# Patient Record
Sex: Male | Born: 1965 | Race: White | Hispanic: No | Marital: Married | Smoking: Never smoker
Health system: Southern US, Community
[De-identification: ages and names within clinical notes are randomized; demographics above are authoritative.]

## PROBLEM LIST (undated history)

## (undated) DIAGNOSIS — K5900 Constipation, unspecified: Secondary | ICD-10-CM

## (undated) DIAGNOSIS — H9190 Unspecified hearing loss, unspecified ear: Secondary | ICD-10-CM

## (undated) DIAGNOSIS — F32A Depression, unspecified: Secondary | ICD-10-CM

## (undated) DIAGNOSIS — K219 Gastro-esophageal reflux disease without esophagitis: Secondary | ICD-10-CM

## (undated) DIAGNOSIS — E7800 Pure hypercholesterolemia, unspecified: Secondary | ICD-10-CM

## (undated) DIAGNOSIS — F329 Major depressive disorder, single episode, unspecified: Secondary | ICD-10-CM

## (undated) DIAGNOSIS — D689 Coagulation defect, unspecified: Secondary | ICD-10-CM

## (undated) DIAGNOSIS — F419 Anxiety disorder, unspecified: Secondary | ICD-10-CM

## (undated) DIAGNOSIS — K760 Fatty (change of) liver, not elsewhere classified: Secondary | ICD-10-CM

## (undated) HISTORY — PX: INNER EAR SURGERY: SHX679

## (undated) HISTORY — DX: Unspecified hearing loss, unspecified ear: H91.90

## (undated) HISTORY — DX: Constipation, unspecified: K59.00

## (undated) HISTORY — DX: Gastro-esophageal reflux disease without esophagitis: K21.9

## (undated) HISTORY — DX: Coagulation defect, unspecified: D68.9

## (undated) HISTORY — DX: Fatty (change of) liver, not elsewhere classified: K76.0

## (undated) HISTORY — PX: TONSILLECTOMY: SUR1361

## (undated) HISTORY — DX: Pure hypercholesterolemia, unspecified: E78.00

---

## 1999-10-30 HISTORY — PX: CYST REMOVAL LEG: SHX6280

## 2003-07-14 ENCOUNTER — Ambulatory Visit (HOSPITAL_COMMUNITY): Admission: RE | Admit: 2003-07-14 | Discharge: 2003-07-14 | Payer: Self-pay | Admitting: Family Medicine

## 2004-08-11 ENCOUNTER — Encounter (INDEPENDENT_AMBULATORY_CARE_PROVIDER_SITE_OTHER): Payer: Self-pay | Admitting: Specialist

## 2004-08-11 ENCOUNTER — Ambulatory Visit (HOSPITAL_COMMUNITY): Admission: RE | Admit: 2004-08-11 | Discharge: 2004-08-11 | Payer: Self-pay | Admitting: Gastroenterology

## 2004-10-11 ENCOUNTER — Inpatient Hospital Stay (HOSPITAL_COMMUNITY): Admission: EM | Admit: 2004-10-11 | Discharge: 2004-10-13 | Payer: Self-pay | Admitting: Emergency Medicine

## 2009-04-01 ENCOUNTER — Encounter: Admission: RE | Admit: 2009-04-01 | Discharge: 2009-04-01 | Payer: Self-pay | Admitting: Family Medicine

## 2009-04-01 ENCOUNTER — Emergency Department (HOSPITAL_COMMUNITY): Admission: EM | Admit: 2009-04-01 | Discharge: 2009-04-01 | Payer: Self-pay | Admitting: Emergency Medicine

## 2011-03-16 NOTE — H&P (Signed)
NAME:  Luis, Russell NO.:  [id number]   MEDICAL RECORD NO.:  [id number]          PATIENT TYPE:  INP   LOCATION:  1827                         FACILITY:  [facility]   PHYSICIAN:  Sherin Quarry, MD      DATE OF BIRTH:  [birth year]   DATE OF ADMISSION:  10/11/2004  DATE OF DISCHARGE:                                HISTORY & PHYSICAL   Luis Russell is a 45 year old man who is generally in good health. 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 They were pulling a helium filled giant balloon which involved  several people manipulating the balloon with ropes. Apparently one of the  boys had some difficulty and Luis Russell went over to help him. In the  process, he was suddenly pulled by the balloon and his back was acutely  twisted. He stated that he felt a twinge in his back at the time, but did  not have any subsequent pain and completed the parade. On Sunday he noticed  that it was hard to move and that his back felt stiff. On Monday  he went to  work, but reported that it was very difficult for him to get out of a chair  and he had to attend a meeting standing up. He did rest okay on Monday  night. On Tuesday, he was unable to get out of bed. He called in sick to  work. His wife gave him two Oxycodone tablets, which she had and he applied  a TENS unit which he had received previously. This made him feel somewhat  better and he says that he slept okay last night. This morning, however,  when he got up he literally was unable to get out of bed secondary to back  pain.  He localizes the pain to the left sacroiliac area. He says that the  pain is aggravated by lying on his back or by attempting to stand. The pain  does not radiate. There is no associated numbness. There is no associated  fever, chills, breathing difficulty, chest pain, abdominal pain,  dysuria, or  hematuria. The patient presented to the [facility] emergency room with these  complaints. He was given Valium 10 mg, prednisone 60 mg, morphine 8 mg, and  Dilaudid 1 mg; but in spite of these treatments he says that the back pain  is still very severe. He does not feel that he will be able to get along at  home. He is therefore admitted for observation and treatment.   His past history is significant for in 2001 he experienced a twisting injury  to his back at work and was out of work for several days. At that time he  had an MRI scan of his back that did not reveal any acute pathology. He was  treated with anti-inflammatory medications and Oxycodone. In August 2004, he  again had low back discomfort. At that time he was seen by the physical  therapy  department and was given a TENS unit. He went for several physical  therapy treatments. He was advised to exercise regularly, but has not been  doing this.   PAST MEDICAL HISTORY:  He currently takes no medications. There are no known  allergies. He has had no operations. Medical illnesses--in addition to the  above mentioned back discomfort,  he was treated for a hip fracture at the  age of 4 by being placed in a body cast for a period of time. Also, he has  previously been diagnosed with hyperlipidemia, but does not take any  treatment for this.   FAMILY HISTORY:  His mother recently died as a result of COPD. She also has  a history of colon cancer. His sister has had two myocardial infarctions.  His brother apparently was diagnosed with a pulmonary embolus.   SOCIAL HISTORY:  He does not smoke. He will very occasionally drink alcohol.  He denies use of drugs. He is married. He has children who are in good  health.  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   REVIEW OF SYSTEMS:  HEAD: He denies headache or dizziness. EYES: He denies  visual blurring or diplopia. EARS, NOSE,  AND THROAT:  Denies earache, sinus  pain, or sore throat. There has been no neck stiffness or soreness. CHEST:  He denies cough, wheezing, or chest congestion. CARDIOVASCULAR: Denies  orthopnea, PND, or ankle edema. GI: He is currently feeling slightly  nauseated after receiving Dilaudid, but he has previously been having any  nausea, vomiting, abdominal pain, hematemesis, or melena. GU: He denies any  dysuria or urinary frequency. NEUROLOGIC: There is no history of seizure or  stroke. ENDO: He denies excessive thirst, urinary frequency, or nocturia.   PHYSICAL EXAMINATION:  VITAL SIGNS: His blood pressure is 104/58, pulse 78,  respirations 16. O2 saturation is 100%.  HEENT: Within normal limits.  CHEST: Clear.  BACK: Tenderness to palpation over the left sacroiliac region. There is no  pain to percussion or palpation over the back itself or over the sacrum.  CARDIOVASCULAR: Normal S1 and S2, without murmurs, rubs, or gallops.  ABDOMEN: Benign and normal bowel sounds. No masses or tenderness. No  guarding or rebound.  NEUROLOGIC: Cranial nerves, motor, sensory, or cerebellar testing is normal.  The patient experiences pain with straight leg raise. The muscles of the  lower extremity appear to be normal. The patient is able to dorsiflex and  plantar flex without difficulty. He is able to dorsiflex the great toe.  Ankle jerks appears to be intact. DP and PT pulses are 2+. There is no  cyanosis or edema.   IMPRESSION:  1.  Acute and chronic low back pain. From the patient's description, I think      this will probably turn out to be a musculoskeletal problem, but it is      currently quite incapacitating.  2.  History of childhood hip fracture.  3.  Hyperlipidemia.  4.  Family history of chronic obstructive pulmonary disease, coronary artery      disease, and colon cancer.  PLAN:  Admit the patient for symptomatic treatment. I will schedule an MRI  scan of his back to rule out any  more serious pathology. In the long run he  may benefit from physical therapy.       SY/MEDQ  D:  10/11/2004  T:  10/11/2004  Job:  [id number]   cc:  Deatra James, M.D.  Fax: [phone]

## 2011-03-16 NOTE — Discharge Summary (Signed)
NAME:  Luis Russell, Luis Russell               ACCOUNT NO.:  [id number]   MEDICAL RECORD NO.:  [id number]          PATIENT TYPE:  INP   LOCATION:  3005                         FACILITY:  [facility]   PHYSICIAN:  Melissa L. Ladona Ridgel, MD  DATE OF BIRTH:  August 12, 1966   DATE OF ADMISSION:  10/11/2004  DATE OF DISCHARGE:  10/13/2004                                 DISCHARGE SUMMARY   DISCHARGE DIAGNOSES:  1.  Lumbar back pain secondary to mild disk bulge at multiple sites and      spinal stenosis.  2.  Severe muscle spasm involving the left upper thigh and lower back      secondary to #1.  3.  Anxiety.  4.  Hyperlipidemia, for which the patient is not on any medication at this      time.   DISCHARGE MEDICATIONS:  1.  Prednisone taper pack from 30 to 5 mg over six days.  2.  Baclofen 20 mg q.6h.  3.  Ultram 50 mg q.4-6h. p.r.n. for pain.  4.  Valium 2 mg b.i.d. as needed for spasms.  5.  Tylenol 650 mg q.4-6h. p.r.n. for moderate pain.   DISCHARGE ACTIVITIES:  The patient was instructed not to drive while taking  the prescribed medications.  He was to wait to go back to work until he saw  Dr. Alveda Reasons on Monday.   DISCHARGE DIET:  Low-fat, low-cholesterol.   SPECIAL INSTRUCTIONS:  He is instructed that he could use a heating pad as  needed, but he was not to sleep on the heating pad as it could cause burns.  He was instructed to call Dr. Wynelle Link p.r.n. and to report for any appointment  at [facility] with Dr. Alveda Reasons at 9:30 a.m. 10/16/04, phone number  [phone].   HISTORY OF PRESENT ILLNESS:  This is a 45 year old white male who presented  to the emergency room with severe lower back pain.  He states that on the  Saturday prior to admission, he was participating in the Christmas parade  with the [employer].  While pulling a helium-filled giant balloon, the  patient states that one of the boys had trouble and so therefore Mr. Patchin  went over to assist him.  In the process he was suddenly pulled  by the  balloon and his back was acutely twisted.  Subsequent to this, he noticed  that his back felt stiff.  On Monday he went to work and found it very  difficult to function, because he could not get out of his chair.  He had to  attend a meeting standing up.  He rested the rest of the day; however, on  Tuesday he was unable to get out of bed and so he called in sick.  He took  two oxycodone and he placed his TENS unit which he had previously from  physical therapy as an outpatient.  He said this made him feel a little bit  better and he was able to sleep; however, on the morning of admission the  pain returned, it was localizing to his lower back and he was  unable to  stand or walk.  The patient was therefore admitted through the emergency  room for further evaluation.   HOSPITAL COURSE:  During the hospital stay, the patient underwent an MRI,  which showed moderate bulging disks with a posterior annular tear at L4-L5.  There was protrusion of the disk at L3-L4 with mild indentation of the  thecal sac.  There was also was a small central protrusion without  associated mass effect at the L5-S1 area.  No signs of spinal stenosis.  A  lumbar spine was undertaken to assist in interpreting the MRI.  There was  mild disk space narrowing at L4-L5, otherwise the examination was negative.  The patient was treated with antispasmodic medications and antipain  medications.  His case was reviewed with the on call physician for [facility].  It was determined that his injuries were non threatening and  that he could be maintained on steroid taper pack, pain medications,  antispasmodics and follow up in the outpatient office with Dr. Alveda Reasons.  The  patient was seen by physical therapy twice and with some effort we were able  to assist the patient with ambulation and show him how to relax his back so  that he would not become stiff or experience excruciating pain.  On the day  of discharge,  the patient remained hemodynamically stable.   PHYSICAL EXAMINATION:  GENERAL:  This is a well developed, well nourished  white male in acute distress only with standing and attempts to ambulate on  the first day.  These symptoms resolved on the second day of admission.  HEENT:  His pupils were equal, round, reactive to light.  Extraocular  muscles were intact.  Mucous membranes were moist.  NECK:  Supple.  There was no JVD.  No lymph nodes.  CHEST:  Clear to auscultation.  There was no rubs, rhonchi or wheezes.  CARDIOVASCULAR:  Regular rate and rhythm, positive S1, S2.  No S3, S4.  No  murmurs, rubs or gallops.  ABDOMEN:  Soft, nontender, nondistended with positive bowel sounds.  EXTREMITIES:  No clubbing, cyanosis or edema.  NEUROLOGICAL:  The patient showed no focal deficits.  He did, however, have  an unusual gait related to purposeful and voluntary splinting of the left  side of his body.  He would cock his hip and bend his knee with ambulation,  however, had appropriate strength and appropriate motor control during  exercise.   PERTINENT LABORATORY VALUES:  Urinalysis which was negative.  Basic  metabolic panel to evaluate for causes for muscle cramps revealed a sodium  of 136, potassium 4.4, chloride 104, CO2 27, BUN 19, creatinine 0.9.  His  magnesium was 2.3.   DISPOSITION:  As the patient found significant relief with the prednisone,  Baclofen and Valium, he was able to be discharged to home with followup to  Dr. Alveda Reasons.  I instructed him on the necessary medications to keep his back  from having significant cramps, and described for him the potential side  effects of some of his medications, namely Ultram, in that it can facilitate  seizure activity.  The patient felt comfortable discharging to home with  followup and on the day of discharge was found to be stable.      Meli  MLT/MEDQ  D:  10/15/2004  T:  10/16/2004  Job:  [id number]   cc:   Nelda Severe, MD  Fax:  [phone]   Deatra James, M.D.  Fax: [phone]

## 2016-05-28 ENCOUNTER — Other Ambulatory Visit: Payer: Self-pay | Admitting: Family Medicine

## 2016-05-28 DIAGNOSIS — R4781 Slurred speech: Secondary | ICD-10-CM

## 2016-05-29 DIAGNOSIS — F32A Depression, unspecified: Secondary | ICD-10-CM | POA: Insufficient documentation

## 2016-05-29 DIAGNOSIS — F419 Anxiety disorder, unspecified: Secondary | ICD-10-CM | POA: Insufficient documentation

## 2016-05-29 DIAGNOSIS — R471 Dysarthria and anarthria: Secondary | ICD-10-CM | POA: Insufficient documentation

## 2016-05-29 DIAGNOSIS — R251 Tremor, unspecified: Secondary | ICD-10-CM | POA: Insufficient documentation

## 2016-06-03 ENCOUNTER — Ambulatory Visit
Admission: RE | Admit: 2016-06-03 | Discharge: 2016-06-03 | Disposition: A | Discharge status: Home / Self Care | Payer: BLUE CROSS/BLUE SHIELD | Source: Ambulatory Visit | Attending: Family Medicine | Admitting: Family Medicine

## 2016-06-03 DIAGNOSIS — R4781 Slurred speech: Secondary | ICD-10-CM

## 2016-06-08 ENCOUNTER — Ambulatory Visit (INDEPENDENT_AMBULATORY_CARE_PROVIDER_SITE_OTHER): Payer: BLUE CROSS/BLUE SHIELD | Admitting: Diagnostic Neuroimaging

## 2016-06-08 ENCOUNTER — Encounter: Payer: Self-pay | Admitting: *Deleted

## 2016-06-08 VITALS — BP 105/72 | HR 85 | Ht 73.0 in | Wt 296.0 lb

## 2016-06-08 DIAGNOSIS — F411 Generalized anxiety disorder: Secondary | ICD-10-CM

## 2016-06-08 DIAGNOSIS — R5383 Other fatigue: Secondary | ICD-10-CM

## 2016-06-08 DIAGNOSIS — R251 Tremor, unspecified: Secondary | ICD-10-CM | POA: Diagnosis not present

## 2016-06-08 DIAGNOSIS — R471 Dysarthria and anarthria: Secondary | ICD-10-CM | POA: Diagnosis not present

## 2016-06-08 NOTE — Progress Notes (Signed)
[facility]  PATIENT: Luis Russell DOB: 05/09/1966  REFERRING CLINICIAN: Pollyann Russell HISTORY FROM: patient and wife  REASON FOR VISIT: new consult    HISTORICAL  CHIEF COMPLAINT:  Chief Complaint  Patient presents with  . Other    rm 7, New Pt, slurred speech, dysarthria, tremors-began 06/03/10, wife- Luis Russell    HISTORY OF PRESENT ILLNESS:   50 year old right-handed male with depression, anxiety, hyperglycemia, here for elevation of slurred speech, tremor, slow movements.  October 2015 patient had a "mental breakdown" due to severe work-related stress. Patient had to stop working at that time. He then developed severe depression and anxiety. He was under care of psychiatrist and treated with medication. Patient was on Prozac and Rexulti. Patient then transitioned to a new psychiatrist and medications were slowly adjusted. This was happening around May 2017. Prozac dose was reduced and Rexulti dose was increased in May 2017.  Suddenly on 05/08/2016 patient had new onset of slurred speech, excessive drooling and saliva, slow movements and tremors. One week prior patient had begun the process of trying to find a job and get back into the workforce. Patient was having significant anxiety related to this. Medications were readjusted after onset of slurred speech but symptoms have not improved. Patient went to ENT, PCP and psychiatrist without specific explanation of symptoms. Patient referred to me for further evaluation.     REVIEW OF SYSTEMS: Full 14 system review of systems performed and negative with exception of: Weight loss fatigue trouble swallowing short of breath cough wheezing weakness slurred speech difficulty swelling passing out tremor restless legs depression anxiety to much sleep decreased energy disinterest in activities.  ALLERGIES: No Known Allergies  HOME MEDICATIONS: No outpatient prescriptions prior to visit.   No facility-administered  medications prior to visit.     PAST MEDICAL HISTORY: Past Medical History:  Diagnosis Date  . Clotting disorder (HCC)   . Hypercholesterolemia     PAST SURGICAL HISTORY: Past Surgical History:  Procedure Laterality Date  . CYST REMOVAL LEG Left 2001  . INNER EAR SURGERY     yrs ago  . TONSILLECTOMY     yrs ago    FAMILY HISTORY: Family History  Problem Relation Age of Onset  . Cancer Mother   . Heart attack Father   . COPD Sister   . Diabetes Brother   . Heart disease Sister     pacemaker    SOCIAL HISTORY:  Social History   Social History  . Marital status: Married    Spouse name: Luis Russell  . Number of children: 2  . Years of education: 16   Occupational History  .      na   Social History Main Topics  . Smoking status: Never Smoker  . Smokeless tobacco: Never Used  . Alcohol use No     Comment: 06/08/16 quit recently  . Drug use: No  . Sexual activity: Not on file   Other Topics Concern  . Not on file   Social History Narrative   lives at home with wife, 2 teenage children   Caffeine 1 cup daily     PHYSICAL EXAM  GENERAL EXAM/CONSTITUTIONAL: Vitals:  Vitals:   06/08/16 0952  BP: 105/72  Pulse: 85  Weight: 296 lb (134.3 kg)  Height: 6\' 1"  (1.854 m)     Body mass index is 39.05 kg/m.  Visual Acuity Screening   Right eye Left eye Both eyes  Without correction: 20/30 20/30   With correction:  Patient is in no distress; well developed, nourished and groomed; neck is supple  CARDIOVASCULAR:  Examination of carotid arteries is normal; no carotid bruits  Regular rate and rhythm, no murmurs  Examination of peripheral vascular system by observation and palpation is normal  EYES:  Ophthalmoscopic exam of optic discs and posterior segments is normal; no papilledema or hemorrhages  MUSCULOSKELETAL:  Gait, strength, tone, movements noted in Neurologic exam below  NEUROLOGIC: MENTAL STATUS:  No flowsheet data  found.  awake, alert, oriented to person, place and time  recent and remote memory intact  normal attention and concentration  language fluent, comprehension intact, naming intact,   fund of knowledge appropriate  CRANIAL NERVE:   2nd - no papilledema on fundoscopic exam  2nd, 3rd, 4th, 6th - pupils equal and reactive to light, visual fields full to confrontation, extraocular muscles intact, no nystagmus  5th - facial sensation symmetric  7th - facial strength symmetric  8th - hearing intact  9th - palate elevates symmetrically, uvula midline  11th - shoulder shrug symmetric  12th - tongue protrusion midline  MILD DYSARTHRIA  MASKED FACIES  MOTOR:   normal bulk and tone, full strength in the BUE, BLE  RESTLESS LEGS; ROCKING AND BOUNCING  SENSORY:   normal and symmetric to light touch, pinprick, temperature, vibration  COORDINATION:   finger-nose-finger, fine finger movements normal  MILD INTENTION TREMOR IN BUE  REFLEXES:   deep tendon reflexes present and symmetric  GAIT/STATION:   narrow based gait; DECR ARM SWING; STOOPED POSTURE;  able to walk tandem     DIAGNOSTIC DATA (LABS, IMAGING, TESTING) - I reviewed patient records, labs, notes, testing and imaging myself where available.  No results found for: WBC, HGB, HCT, MCV, PLT No results found for: NA, K, CL, CO2, GLUCOSE, BUN, CREATININE, CALCIUM, PROT, ALBUMIN, AST, ALT, ALKPHOS, BILITOT, GFRNONAA, GFRAA No results found for: CHOL, HDL, LDLCALC, LDLDIRECT, TRIG, CHOLHDL No results found for: XBMW4X No results found for: VITAMINB12 No results found for: TSH   06/03/16 MRI brain [I reviewed images myself and agree with interpretation. -VRP]  1. No acute intracranial abnormality. 2. Mild chronic small vessel ischemic disease.     ASSESSMENT AND PLAN  50 y.o. year old male here with sudden new onset slurred speech, saliva problem control, increased stress and anxiety on 05/08/2016.  Neurologic examination notable for psychomotor slowing, slurred speech, decreased arm swing, generalized restlessness and akathisia. MRI brain is unremarkable. Will proceed with further workup and referral to therapy for evaluation.   Ddx: conversion reaction, metabolic, medication side effect, neurodegenerative  1. Dysarthria   2. Tremor   3. Generalized anxiety disorder   4. Other fatigue      PLAN:  Orders Placed This Encounter  Procedures  . Vitamin B12  . TSH  . Hemoglobin A1c  . Ammonia  . CK  . Aldolase  . Ambulatory referral to Physical Therapy  . Ambulatory referral to Speech Therapy   Return in about 2 months (around 08/08/2016).    Suanne Marker, MD 06/08/2016, 10:32 AM Certified in Neurology, Neurophysiology and Neuroimaging  [facility] [address], [address] [place], [place] [address] [phone]

## 2016-06-08 NOTE — Patient Instructions (Signed)
Thank you for coming to see [place] at [facility]. I hope we have been able to provide you high quality care today.  You may receive a patient satisfaction survey over the next few weeks. We would appreciate your feedback and comments so that we may continue to improve ourselves and the health of our patients.  - I will check lab testing - I will refer you to speech and physical therapy   ~~~~~~~~~~~~~~~~~~~~~~~~~~~~~~~~~~~~~~~~~~~~~~~~~~~~~~~~~~~~~~~~~  DR. Emanuel Dowson'S GUIDE TO HAPPY AND HEALTHY LIVING These are some of my general health and wellness recommendations. Some of them may apply to you better than others. Please use common sense as you try these suggestions and feel free to ask me any questions.   ACTIVITY/FITNESS Mental, social, emotional and physical stimulation are very important for brain and body health. Try learning a new activity (arts, music, language, sports, games).  Keep moving your body to the best of your abilities. You can do this at home, inside or outside, the park, community center, gym or anywhere you like. Consider a physical therapist or personal trainer to get started. Consider the app Sworkit. Fitness trackers such as smart-watches, smart-phones or Fitbits can help as well.   NUTRITION Eat more plants: colorful vegetables, nuts, seeds and berries.  Eat less sugar, salt, preservatives and processed foods.  Avoid toxins such as cigarettes and alcohol.  Drink water when you are thirsty. Warm water with a slice of lemon is an excellent morning drink to start the day.  Consider these websites for more information The Nutrition Source (https://www.henry-hernandez.biz/) Precision Nutrition (WindowBlog.ch)   RELAXATION Consider practicing mindfulness meditation or other relaxation techniques such as deep breathing, prayer, yoga, tai chi, massage. See website mindful.org or the apps Headspace or Calm  to help get started.   SLEEP Try to get at least 7-8+ hours sleep per day. Regular exercise and reduced caffeine will help you sleep better. Practice good sleep hygeine techniques. See website sleep.org for more information.   PLANNING Prepare estate planning, living will, healthcare POA documents. Sometimes this is best planned with the help of an attorney. Theconversationproject.org and agingwithdignity.org are excellent resources.

## 2016-06-10 LAB — HEMOGLOBIN A1C
Est. average glucose Bld gHb Est-mCnc: 120 mg/dL
HEMOGLOBIN A1C: 5.8 % — AB (ref 4.8–5.6)

## 2016-06-10 LAB — VITAMIN B12: Vitamin B-12: 613 pg/mL (ref 211–946)

## 2016-06-10 LAB — AMMONIA: Ammonia: 65 ug/dL (ref 27–102)

## 2016-06-10 LAB — ALDOLASE: ALDOLASE: 3.8 U/L (ref 3.3–10.3)

## 2016-06-10 LAB — TSH: TSH: 2.69 u[IU]/mL (ref 0.450–4.500)

## 2016-06-10 LAB — CK: CK TOTAL: 65 U/L (ref 24–204)

## 2016-06-12 ENCOUNTER — Telehealth: Payer: Self-pay | Admitting: *Deleted

## 2016-06-12 NOTE — Telephone Encounter (Signed)
Per Dr Penumalli, LMarjory LiesVM informing patient his lab results are unremarkable. Dr Marjory LiesPenumalli will continue his current treatment plan, PT and Speech therapy and follow up in Oct. Left name, number for any questions.

## 2016-07-09 ENCOUNTER — Inpatient Hospital Stay (HOSPITAL_COMMUNITY)
Admission: AD | Admit: 2016-07-09 | Discharge: 2016-07-12 | DRG: 885 | Disposition: A | Discharge status: Home / Self Care | Payer: BLUE CROSS/BLUE SHIELD | Attending: Psychiatry | Admitting: Psychiatry

## 2016-07-09 ENCOUNTER — Encounter (HOSPITAL_COMMUNITY): Payer: Self-pay | Admitting: *Deleted

## 2016-07-09 DIAGNOSIS — Z808 Family history of malignant neoplasm of other organs or systems: Secondary | ICD-10-CM | POA: Diagnosis not present

## 2016-07-09 DIAGNOSIS — Z79899 Other long term (current) drug therapy: Secondary | ICD-10-CM

## 2016-07-09 DIAGNOSIS — Z8249 Family history of ischemic heart disease and other diseases of the circulatory system: Secondary | ICD-10-CM | POA: Diagnosis not present

## 2016-07-09 DIAGNOSIS — F411 Generalized anxiety disorder: Secondary | ICD-10-CM | POA: Diagnosis present

## 2016-07-09 DIAGNOSIS — R45851 Suicidal ideations: Secondary | ICD-10-CM | POA: Diagnosis present

## 2016-07-09 DIAGNOSIS — E7800 Pure hypercholesterolemia, unspecified: Secondary | ICD-10-CM | POA: Diagnosis present

## 2016-07-09 DIAGNOSIS — Z809 Family history of malignant neoplasm, unspecified: Secondary | ICD-10-CM

## 2016-07-09 DIAGNOSIS — Z811 Family history of alcohol abuse and dependence: Secondary | ICD-10-CM | POA: Diagnosis not present

## 2016-07-09 DIAGNOSIS — Z818 Family history of other mental and behavioral disorders: Secondary | ICD-10-CM

## 2016-07-09 DIAGNOSIS — E785 Hyperlipidemia, unspecified: Secondary | ICD-10-CM | POA: Diagnosis present

## 2016-07-09 DIAGNOSIS — Z825 Family history of asthma and other chronic lower respiratory diseases: Secondary | ICD-10-CM | POA: Diagnosis not present

## 2016-07-09 DIAGNOSIS — F332 Major depressive disorder, recurrent severe without psychotic features: Principal | ICD-10-CM | POA: Diagnosis present

## 2016-07-09 DIAGNOSIS — Z833 Family history of diabetes mellitus: Secondary | ICD-10-CM | POA: Diagnosis not present

## 2016-07-09 DIAGNOSIS — Z599 Problem related to housing and economic circumstances, unspecified: Secondary | ICD-10-CM | POA: Diagnosis not present

## 2016-07-09 HISTORY — DX: Anxiety disorder, unspecified: F41.9

## 2016-07-09 HISTORY — DX: Depression, unspecified: F32.A

## 2016-07-09 HISTORY — DX: Major depressive disorder, single episode, unspecified: F32.9

## 2016-07-09 LAB — COMPREHENSIVE METABOLIC PANEL
ALT: 39 U/L (ref 17–63)
AST: 28 U/L (ref 15–41)
Albumin: 4.1 g/dL (ref 3.5–5.0)
Alkaline Phosphatase: 102 U/L (ref 38–126)
Anion gap: 7 (ref 5–15)
BUN: 14 mg/dL (ref 6–20)
CALCIUM: 9.5 mg/dL (ref 8.9–10.3)
CO2: 28 mmol/L (ref 22–32)
Chloride: 103 mmol/L (ref 101–111)
Creatinine, Ser: 0.9 mg/dL (ref 0.61–1.24)
GFR calc Af Amer: >60 mL/min (ref >60)
GFR calc non Af Amer: >60 mL/min (ref >60)
GLUCOSE: 146 mg/dL — AB (ref 65–99)
POTASSIUM: 3.9 mmol/L (ref 3.5–5.1)
Sodium: 138 mmol/L (ref 135–145)
Total Bilirubin: 1 mg/dL (ref 0.3–1.2)
Total Protein: 7.7 g/dL (ref 6.5–8.1)

## 2016-07-09 LAB — CBC
HCT: 41.1 % (ref 39.0–52.0)
Hemoglobin: 13.2 g/dL (ref 13.0–17.0)
MCH: 29.3 pg (ref 26.0–34.0)
MCHC: 32.1 g/dL (ref 30.0–36.0)
MCV: 91.1 fL (ref 78.0–100.0)
Platelets: 239 10*3/uL (ref 150–400)
RBC: 4.51 MIL/uL (ref 4.22–5.81)
RDW: 13.5 % (ref 11.5–15.5)
WBC: 8.9 10*3/uL (ref 4.0–10.5)

## 2016-07-09 LAB — RAPID URINE DRUG SCREEN, HOSP PERFORMED
Amphetamines: NOT DETECTED
Barbiturates: NOT DETECTED
Benzodiazepines: POSITIVE — AB
COCAINE: NOT DETECTED
Opiates: NOT DETECTED
Tetrahydrocannabinol: NOT DETECTED

## 2016-07-09 LAB — ETHANOL: Alcohol, Ethyl (B): <5 mg/dL (ref <5)

## 2016-07-09 LAB — TSH: TSH: 3.048 u[IU]/mL (ref 0.350–4.500)

## 2016-07-09 MED ORDER — ALUM & MAG HYDROXIDE-SIMETH 200-200-20 MG/5ML PO SUSP: 30.0000 mL | ORAL | Status: DC | PRN

## 2016-07-09 MED ORDER — HYDROXYZINE HCL 25 MG PO TABS
25.0000 mg | ORAL_TABLET | Freq: Four times a day (QID) | ORAL | Status: DC | PRN
  Administered 2016-07-09 – 2016-07-12 (×5): 25 mg via ORAL
  Filled 2016-07-09 (×5): qty 1

## 2016-07-09 MED ORDER — CLONAZEPAM 0.5 MG PO TABS
0.5000 mg | ORAL_TABLET | Freq: Every day | ORAL | Status: DC
  Administered 2016-07-09 – 2016-07-12 (×4): 0.5 mg via ORAL
  Filled 2016-07-09 (×4): qty 1

## 2016-07-09 MED ORDER — CLONAZEPAM 1 MG PO TABS
1.0000 mg | ORAL_TABLET | Freq: Every day | ORAL | Status: DC
  Administered 2016-07-09 – 2016-07-11 (×3): 1 mg via ORAL
  Filled 2016-07-09 (×3): qty 1

## 2016-07-09 MED ORDER — ACETAMINOPHEN 325 MG PO TABS: 650.0000 mg | ORAL_TABLET | Freq: Four times a day (QID) | ORAL | Status: DC | PRN

## 2016-07-09 MED ORDER — VENLAFAXINE HCL ER 37.5 MG PO CP24
37.5000 mg | ORAL_CAPSULE | Freq: Every day | ORAL | Status: DC
  Administered 2016-07-10 – 2016-07-12 (×3): 37.5 mg via ORAL
  Filled 2016-07-09 (×5): qty 1

## 2016-07-09 MED ORDER — ATORVASTATIN CALCIUM 40 MG PO TABS
40.0000 mg | ORAL_TABLET | Freq: Every day | ORAL | Status: DC
  Administered 2016-07-09 – 2016-07-11 (×3): 40 mg via ORAL
  Filled 2016-07-09 (×6): qty 1

## 2016-07-09 MED ORDER — BUPROPION HCL 75 MG PO TABS
75.0000 mg | ORAL_TABLET | Freq: Every day | ORAL | Status: DC
  Administered 2016-07-10 – 2016-07-12 (×3): 75 mg via ORAL
  Filled 2016-07-09 (×5): qty 1

## 2016-07-09 MED ORDER — MAGNESIUM HYDROXIDE 400 MG/5ML PO SUSP: 30.0000 mL | Freq: Every day | ORAL | Status: DC | PRN

## 2016-07-09 MED ORDER — TRAZODONE HCL 50 MG PO TABS
50.0000 mg | ORAL_TABLET | Freq: Every evening | ORAL | Status: DC | PRN
  Administered 2016-07-09 – 2016-07-10 (×2): 50 mg via ORAL
  Filled 2016-07-09 (×3): qty 1

## 2016-07-09 NOTE — H&P (Signed)
Psychiatric Admission Assessment Adult  Patient Identification: Luis Russell MRN:  [id number] Date of Evaluation:  07/09/2016 Chief Complaint:Patient states " I am depressed, my disability is going to run out in February and I am having financial issues - I keep thinking about suicide.'    Principal Diagnosis: MDD (major depressive disorder), recurrent episode, severe (HCC) Diagnosis:   Patient Active Problem List   Diagnosis Date Noted  . MDD (major depressive disorder), recurrent episode, severe (HCC) [F33.2] 07/09/2016  . Generalized anxiety disorder [F41.1] 07/09/2016  . Hyperlipidemia [E78.5] 07/09/2016   History of Present Illness: Luis Russell is a 50 y.o. caucasian male , married , lives in [place] , has a hx of depression , GAD , as well as HLD, who [facility] for worsening SI .   Per initial notes in EHR : ' [facility] and reporting many risk factors for suicide. Pt reports having active SI for the past 2 weeks and having plans since yesterday. Pt has a plan to slit his wrists with a knife. Pt is on long term disability and is home alone during the day to be able to carry out his plan. Pt just started on Welbutrin on Friday. He is followed for psychiatry with Dr. Donell BeersPlovsky and therapy with the [facility].  "   Patient seen and chart reviewed today .Discussed patient with treatment team. Pt today is seen as very restless, anxious , unable to sit still - kept shaking his LE during evaluation. 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 Pt reports all this is making him too nervous and he keeps thinking about suicide. Pt reports he has never been admitted to a psychiatric IP unit before , but he sees Dr.Pvlosky at [facility] - who is treating him for MDD and GAD. Pt  reports being on Prozac 80 mg which was reduced to 40 mg for lack of efficacy. Pt was also started on Wellbutrin now at 100 mg po bid a week ago- pt reports he became more anxious after the medications changes. Pt is also getting therapy at [facility] - see therapist every 2 weeks.    Associated Signs/Symptoms: Depression Symptoms:  depressed mood, anhedonia, psychomotor agitation, fatigue, feelings of worthlessness/guilt, difficulty concentrating, hopelessness, recurrent thoughts of death, anxiety, (Hypo) Manic Symptoms:  Distractibility, Anxiety Symptoms:  Excessive Worry, Psychotic Symptoms:  denies PTSD Symptoms: Negative Total Time spent with patient: 45 minutes  Past Psychiatric History: Patient has hx of MDD, GAD - follows up with Dr.Pvlosky at [facility] , also has therapist at [facility] , denies past hx of suicide attempts.   Is the patient at risk to self? Yes.    Has the patient been a risk to self in the past 6 months? No.  Has the patient been a risk to self within the distant past? No.  Is the patient a risk to others? No.  Has the patient been a risk to others in the past 6 months? No.  Has the patient been a risk to others within the distant past? No.   Prior Inpatient Therapy: Prior Inpatient Therapy: No Prior Outpatient Therapy: Prior Outpatient Therapy: Yes Prior Therapy Dates: 2015 Prior Therapy Facilty/Provider(s): [facility] Reason for Treatment: PTSD; depression Does patient  have an ACCT team?: No Does patient have Intensive In-House Services?  : No Does patient have [facility] services? : No Does patient have P4CC services?: No  Alcohol Screening: Patient refused Alcohol Screening Tool: Yes Substance Abuse History in the last 12 months:  No. Consequences of Substance Abuse: Negative Previous Psychotropic Medications: Yes rexulti ( caused speech impediment) , zoloft ( lack of efficacy)  Psychological Evaluations: No  Past Medical  History:  Past Medical History:  Diagnosis Date  . Anxiety   . Clotting disorder (HCC)   . Depression   . Hypercholesterolemia     Past Surgical History:  Procedure Laterality Date  . CYST REMOVAL LEG Left 2001  . INNER EAR SURGERY     yrs ago  . TONSILLECTOMY     yrs ago   Family History:  Family History  Problem Relation Age of Onset  . Cancer Mother   . Depression Mother   . Heart attack Father   . Alcoholism Father   . COPD Sister   . Diabetes Brother   . Heart disease Sister     pacemaker   Family Psychiatric  History: Mother had hx of depression, father was an alcoholic, denies hx of suicide in family. Tobacco Screening: Have you used any form of tobacco in the last 30 days? (Cigarettes, Smokeless Tobacco, Cigars, and/or Pipes): No Social History: Pt is married , lives with wife, has two children , lives in [place] , is on long term disability per his employer , which will run out soon, wife is employed and is supportive. History  Alcohol Use  . Yes    Comment: "socially"     History  Drug Use No    Additional Social History: Marital status: Married    Pain Medications: pt denies Prescriptions: prozac; wellbutrin (started Friday-07/06/16) Over the Counter: pt denies History of alcohol / drug use?: No history of alcohol / drug abuse                    Allergies:  No Known Allergies Lab Results: No results found for this or any previous visit (from the past 48 hour(s)).  Blood Alcohol level:  No results found for: [facility]  Metabolic Disorder Labs:  Lab Results  Component Value Date   HGBA1C 5.8 (H) 06/08/2016   No results found for: PROLACTIN No results found for: CHOL, TRIG, HDL, CHOLHDL, VLDL, LDLCALC  Current Medications: Current Facility-Administered Medications  Medication Dose Route Frequency Provider Last Rate Last Dose  . acetaminophen (TYLENOL) tablet 650 mg  650 mg Oral Q6H PRN Thermon Leyland, NP      . alum & mag hydroxide-simeth  (MAALOX/MYLANTA) 200-200-20 MG/5ML suspension 30 mL  30 mL Oral Q4H PRN Thermon Leyland, NP      . atorvastatin (LIPITOR) tablet 40 mg  40 mg Oral q1800 Jomarie Longs, MD      . Melene Muller ON 07/10/2016] buPROPion (WELLBUTRIN) tablet 75 mg  75 mg Oral Q breakfast Mavrick Mcquigg, MD      . clonazePAM (KLONOPIN) tablet 0.5 mg  0.5 mg Oral Daily Sharisse Rantz, MD      . clonazePAM (KLONOPIN) tablet 1 mg  1 mg Oral QHS Halynn Reitano, MD      . hydrOXYzine (ATARAX/VISTARIL) tablet 25 mg  25 mg Oral Q6H PRN Thermon Leyland, NP      . magnesium hydroxide (MILK OF MAGNESIA) suspension 30 mL  30 mL Oral Daily PRN Thermon Leyland, NP      .  traZODone (DESYREL) tablet 50 mg  50 mg Oral QHS PRN Thermon Leyland, NP      . Melene Muller ON 07/10/2016] venlafaxine XR (EFFEXOR-XR) 24 hr capsule 37.5 mg  37.5 mg Oral Q breakfast Jomarie Longs, MD       PTA Medications: Prescriptions Prior to Admission  Medication Sig Dispense Refill Last Dose  . atorvastatin (LIPITOR) 40 MG tablet 40 mg daily. 06/08/16 has not taken x 1 1/2 weeks   Past Week at Unknown time  . buPROPion (WELLBUTRIN XL) 150 MG 24 hr tablet 150 mg daily. X 4 days then increase to 2 tablets daily  1 Past Week at Unknown time  . clonazePAM (KLONOPIN) 0.5 MG tablet 0.5 mg daily.    Past Week at Unknown time  . clonazePAM (KLONOPIN) 0.5 MG tablet Take 1 mg by mouth at bedtime.   Past Week at Unknown time  . FLUoxetine (PROZAC) 40 MG capsule 40 mg daily.   Past Week at Unknown time  . hydrOXYzine (VISTARIL) 25 MG capsule TAKE 1, 2, OR 3 CAPSULES DAILY AS NEEDED  2 Past Week at Unknown time  . QUEtiapine (SEROQUEL) 25 MG tablet 25 mg daily.   1 Past Week at Unknown time  . QUEtiapine (SEROQUEL) 50 MG tablet Take 50 mg by mouth at bedtime.   Past Week at Unknown time    Musculoskeletal: Strength & Muscle Tone: within normal limits Gait & Station: normal Patient leans: N/A  Psychiatric Specialty Exam: Physical Exam  Nursing note and vitals  reviewed. Constitutional: He is oriented to person, place, and time. He appears well-developed and well-nourished.  HENT:  Head: Normocephalic and atraumatic.  Eyes: Conjunctivae and EOM are normal.  Neck: Normal range of motion. Neck supple. No thyromegaly present.  Respiratory: Effort normal.  GI: He exhibits no distension.  Musculoskeletal: Normal range of motion. He exhibits no edema.  Neurological: He is alert and oriented to person, place, and time.  Psychiatric: His speech is normal. His mood appears anxious. He is withdrawn. Cognition and memory are normal. He expresses impulsivity. He exhibits a depressed mood. He expresses suicidal ideation.    Review of Systems  Psychiatric/Behavioral: Positive for depression and suicidal ideas. The patient is nervous/anxious.   All other systems reviewed and are negative.   Blood pressure 117/75, pulse 92, temperature 98 F (36.7 C), resp. rate 16, height 6\' 1"  (1.854 m), weight (!) 136.5 kg (301 lb), SpO2 95 %.Body mass index is 39.71 kg/m.  General Appearance: Fairly Groomed  Eye Contact:  Fair  Speech:  Normal Rate  Volume:  Normal  Mood:  Anxious and Depressed  Affect:  Congruent  Thought Process:  Goal Directed and Descriptions of Associations: Circumstantial  Orientation:  Full (Time, Place, and Person)  Thought Content:  Logical and Rumination  Suicidal Thoughts:  Yes. Cannot stop thinking about suicide - so decided to get help before he acted on it  Homicidal Thoughts:  No  Memory:  Immediate;   Fair Recent;   Fair Remote;   Fair  Judgement:  Impaired  Insight:  Fair  Psychomotor Activity:  Increased and Restlessness  Concentration:  Concentration: Fair and Attention Span: Fair  Recall:  [employer] of Knowledge:  Fair  Language:  Fair  Akathisia:  No  Handed:  Right  AIMS (if indicated):     Assets:  Communication Skills Desire for Improvement Social Support  ADL's:  Intact  Cognition:  WNL  Sleep:  Treatment Plan Summary:Luis Russell is a 50 y.o. caucasian male , married , lives in [place] , has a hx of depression , GAD , as well as HLD, who presented to [facility] as a walk in for worsening SI .  Patient continues to be depressed and anxious - will start treatment. Daily contact with patient to assess and evaluate symptoms and progress in treatment and Medication management   Patient will benefit from inpatient treatment and stabilization.  Estimated length of stay is 5-7 days.  Reviewed past medical records,treatment plan.  Pt follows up with Dr.Pvlosky at [facility] - CSW to give a courtesy call. Will discontinue Prozac for lack of efficay. Will reduce Wellbutrin to 75 mg po daily - due to increased anxiety sx. Will add Effexor XR 37.5 mg po daily for affective sx. Will restart Klonopin 0.5 mg po daily and 1 mg po qhs for anxiety sx- Verified [place] controlled substance database . Will continue to monitor vitals ,medication compliance and treatment side effects while patient is here.  Will monitor for medical issues as well as call consult as needed.  Pending labs cbc,cmp ,  tsh, uds , bal . CSW will start working on disposition.  Patient to participate in therapeutic milieu .      Observation Level/Precautions:  15 minute checks    Psychotherapy:  Individual and group therapy     Consultations:  CSW   Discharge Concerns:  Stability and safety       Physician Treatment Plan for Primary Diagnosis: MDD (major depressive disorder), recurrent episode, severe (HCC) Long Term Goal(s): Improvement in symptoms so as ready for discharge  Short Term Goals: Ability to disclose and discuss suicidal ideas and Ability to identify and develop effective coping behaviors will improve  Physician Treatment Plan for Secondary Diagnosis: Principal Problem:   MDD (major depressive disorder), recurrent episode, severe (HCC) Active Problems:   Generalized anxiety disorder    Hyperlipidemia  Long Term Goal(s): Improvement in symptoms so as ready for discharge  Short Term Goals: Ability to disclose and discuss suicidal ideas and Ability to identify and develop effective coping behaviors will improve  I certify that inpatient services furnished can reasonably be expected to improve the patient's condition.    Jomarie Longs, MD 9/11/20173:46 PM

## 2016-07-09 NOTE — [facility] Suicide Risk Assessment (Signed)
[facility] Admission [facility]icide [facility] Assessment   Nursing information obtained from:    Demographic factors:    Current Mental Status:    Loss Factors:    Historical Factors:    Risk Reduction Factors:     Total Time spent with patient: 30 minutes Principal Problem: MDD (major depressive disorder), recurrent episode, severe (HCC) Diagnosis:   Patient Active Problem List   Diagnosis Date Noted  . MDD (major depressive disorder), recurrent episode, severe (HCC) [F33.2] 07/09/2016  . Generalized anxiety disorder [F41.1] 07/09/2016   Subjective Data: Please see H&P.   Continued Clinical Symptoms:    The "Alcohol Use Disorders Identification Test", Guidelines for Use in Primary Care, Second Edition.  World Science writerHealth Organization [facility](WHO). Score between 0-7:  no or low risk or alcohol related problems. Score between 8-15:  moderate risk of alcohol related problems. Score between 16-19:  high risk of alcohol related problems. Score 20 or above:  warrants further diagnostic evaluation for alcohol dependence and treatment.   CLINICAL FACTORS:   Severe Anxiety and/or Agitation Depression:   Impulsivity   Psychiatric Specialty Exam: Physical Exam  ROS  Blood pressure 117/75, pulse 92, temperature 98 F (36.7 C), resp. rate 16, height 6\' 1"  (1.854 m), weight (!) 136.5 kg (301 lb), SpO2 95 %.Body mass index is 39.71 kg/m.        Please see H&P.                                                   COGNITIVE FEATURES THAT CONTRIBUTE TO RISK:  Closed-mindedness, Polarized thinking and Thought constriction (tunnel vision)    SUICIDE RISK:   Moderate:  Frequent suicidal ideation with limited intensity, and duration, some specificity in terms of plans, no associated intent, good self-control, limited dysphoria/symptomatology, some risk factors present, and identifiable protective factors, including available and accessible social support.   PLAN OF CARE:Please see H&P.   I  certify that inpatient services furnished can reasonably be expected to improve the patient's condition.  Keifer Habib, MD 07/09/2016, 3:26 PM

## 2016-07-09 NOTE — H&P (Signed)
Behavioral Health Medical Screening Exam  Luis Russell is an 50 y.o. male who presented from IOP reporting symptoms of depression and anxiety. He endorses suicidal plan to cut his wrists. Reports only medical history is elevated cholesterol. Is being evaluated for inpatient psychiatric treatment.   Total Time spent with patient: 15 minutes  Psychiatric Specialty Exam: Physical Exam  Review of Systems  Constitutional: Negative.   HENT: Negative.   Eyes: Negative.   Respiratory: Negative.   Cardiovascular: Negative.   Gastrointestinal: Negative.   Genitourinary: Negative.   Musculoskeletal: Negative.   Skin: Negative.   Neurological: Negative.   Endo/Heme/Allergies: Negative.   Psychiatric/Behavioral: Positive for depression and suicidal ideas. Negative for hallucinations, memory loss and substance abuse. The patient is nervous/anxious. The patient does not have insomnia.     Blood pressure 112/77, pulse 77, temperature 98 F (36.7 C), temperature source Oral, resp. rate 18.There is no height or weight on file to calculate BMI.  General Appearance: Casual  Eye Contact:  Good  Speech:  Clear and Coherent  Volume:  Normal  Mood:  Anxious  Affect:  Depressed  Thought Process:  Coherent and Goal Directed  Orientation:  Full (Time, Place, and Person)  Thought Content:  Symptoms of depression/anxiety   Suicidal Thoughts:  Yes.  with intent/plan  Homicidal Thoughts:  No  Memory:  Immediate;   Good Recent;   Good Remote;   Good  Judgement:  Fair  Insight:  Present  Psychomotor Activity:  Increased and Restlessness  Concentration: Concentration: Good and Attention Span: Good  Recall:  Good  Fund of Knowledge:Good  Language: Good  Akathisia:  No  Handed:  Right  AIMS (if indicated):     Assets:  Communication Skills Desire for Improvement Financial Resources/Insurance Leisure Time Physical Health Resilience Social Support  Sleep:       Musculoskeletal: Strength  & Muscle Tone: within normal limits Gait & Station: normal Patient leans: N/A  Blood pressure 112/77, pulse 77, temperature 98 F (36.7 C), temperature source Oral, resp. rate 18.  Recommendations:  Based on my evaluation the patient does not appear to have an emergency medical condition.  Fransisca KaufmannAVIS, Chiquetta Langner, NP 07/09/2016, 10:53 AM

## 2016-07-09 NOTE — [facility] Group Notes (Signed)
[facility] LCSW Group Therapy  07/09/2016 1:15pm  Type of Therapy:  Group Therapy vercoming Obstacles  Participation Level:  Active  Participation Quality:  Appropriate   Affect:  Appropriate  Cognitive:  Appropriate and Oriented  Insight:  Developing/Improving and Improving  Engagement in Therapy:  Improving  Modes of Intervention:  Discussion, Exploration, Problem-solving and Support  Description of Group:   In this group patients will be encouraged to explore what they see as obstacles to their own wellness and recovery. They will be guided to discuss their thoughts, feelings, and behaviors related to these obstacles. The group will process together ways to cope with barriers, with attention given to specific choices patients can make. Each patient will be challenged to identify changes they are motivated to make in order to overcome their obstacles. This group will be process-oriented, with patients participating in exploration of their own experiences as well as giving and receiving support and challenge from other group members.  Summary of Patient Progress: Pt identified lack of motivation as a current obstacle. He expressed that he finds it difficult to complete every day tasks and function at an appropriate level. Pt expressed using support appropriately would be a goal he wants to work on after discharge.   Therapeutic Modalities:   Cognitive Behavioral Therapy Solution Focused Therapy Motivational Interviewing Relapse Prevention Therapy   Chad CordialLauren Carter, LCSWA 07/09/2016 3:53 PM

## 2016-07-09 NOTE — Progress Notes (Signed)
Adult Psychoeducational Group Note  Date:  07/09/2016 Time:  9:01 PM  Group Topic/Focus:  Wrap-Up Group:   The focus of this group is to help patients review their daily goal of treatment and discuss progress on daily workbooks.   Participation Level:  Active  Participation Quality:  Appropriate  Affect:  Appropriate  Cognitive:  Alert and Appropriate  Insight: Appropriate and Good  Engagement in Group:  Engaged  Modes of Intervention:  Activity and Discussion  Additional Comments:  Pt shared reason for admission.

## 2016-07-09 NOTE — Progress Notes (Signed)
Patient ID: Luis Russell, male   DOB: 08/30/1966, 50 y.o.   MRN: [id number] Patient was a walkin today.  Patient lives with his wife and daughter.  Patient sts he has struggled with depression and anxiety for 2 years.  Patient has outpatient therapy 2x a week.  Patient c/o suicidal thoughts today due to high anxiety, mostly related to money problems; denies plan.  Patient [facility] [place]; sts he was seen 3 weeks ago.

## 2016-07-09 NOTE — Progress Notes (Signed)
D: Patient seen pacing the hallway. Complained of anxiety. Patient verbalize concerns about his klonopin administration. Staff answered his questions and understanding verbalized by patient. Patient denies active SI, HI, AH/VH. No behavioral issues noted.  A: Staff offered support and encouragement as needed. Due meds given as ordered. Every 15 minutes check for safety maintained. Will continue to monitor patient for safety and  Stability.  R: Patient remains safe.

## 2016-07-09 NOTE — BH Assessment (Addendum)
Assessment Note  Luis Russell is a 50 y.o. male who [facility], after [facility] and reporting many risk factors for suicide. Pt reports having active SI for the past 2 weeks and having plans since yesterday. Pt has a plan to slit his wrists with a knife. Pt is on long term disability and is home alone during the day to be able to carry out his plan. Pt just started on Welbutrin on Friday. He is followed for psychiatry with Dr. Donell BeersPlovsky and therapy with the [facility].    Diagnosis: MDD, recurrent episode, severe; PTSD, by hx; GAD  Past Medical History:  Past Medical History:  Diagnosis Date  . Clotting disorder (HCC)   . Hypercholesterolemia     Past Surgical History:  Procedure Laterality Date  . CYST REMOVAL LEG Left 2001  . INNER EAR SURGERY     yrs ago  . TONSILLECTOMY     yrs ago    Family History:  Family History  Problem Relation Age of Onset  . Cancer Mother   . Heart attack Father   . COPD Sister   . Diabetes Brother   . Heart disease Sister     pacemaker    Social History:  reports that he has never smoked. He has never used smokeless tobacco. He reports that he does not drink alcohol or use drugs.  Additional Social History:  Alcohol / Drug Use Pain Medications: pt denies Prescriptions: prozac; wellbutrin (started Friday-07/06/16) Over the Counter: pt denies History of alcohol / drug use?: No history of alcohol / drug abuse  CIWA: CIWA-Ar BP: 112/77 Pulse Rate: 77 COWS:    Allergies: No Known Allergies  Home Medications:  No prescriptions prior to admission.    OB/GYN Status:  No LMP for male patient.  General Assessment Data [facility] TTS Assessment: In system Is this a Tele or Face-to-Face Assessment?: Face-to-Face Is this an Initial Assessment or a Re-assessment for this encounter?: Initial Assessment Marital status: Married Living Arrangements: Spouse/significant  other, Children Can pt return to current living arrangement?: Yes Admission Status: Voluntary Is patient capable of signing voluntary admission?: Yes Referral Source: Self/Family/Friend Insurance type: Scientist, research (physical sciences)BCBS  Medical Screening Exam [facility] Walk[facility]in ONLY) Medical Exam completed: Yes  Crisis Care Plan Living Arrangements: Spouse/significant other, Children Name of Psychiatrist: Dr. Awilda MetroPlofsky Name of Therapist: Melissa @ [facility]  Education Status Is patient currently in school?: No  Risk to self with the past 6 months Suicidal Ideation: Yes-Currently Present Has patient been a risk to self within the past 6 months prior to admission? : No Suicidal Intent: Yes-Currently Present Has patient had any suicidal intent within the past 6 months prior to admission? : No Is patient at risk for suicide?: Yes Suicidal Plan?: Yes-Currently Present Has patient had any suicidal plan within the past 6 months prior to admission? : No Specify Current Suicidal Plan: cut wrists with a knife Access to Means: Yes Specify Access to Suicidal Means: has knives at home What has been your use of drugs/alcohol within the last 12 months?: pt denies use Previous Attempts/Gestures: No Intentional Self Injurious Behavior: None Family Suicide History: No Recent stressful life event(s): Other (Comment) (long term disability about to run out) Persecutory voices/beliefs?: No Depression: Yes Depression Symptoms: Feeling worthless/self pity, Loss of interest in usual pleasures Substance abuse history and/or treatment for substance abuse?: No Suicide prevention information given to non-admitted patients: Not applicable  Risk to Others within the past 6 months Homicidal Ideation: No Does patient have any lifetime risk of violence toward others beyond the six months prior to admission? : No Thoughts of Harm to Others: No Current Homicidal Intent: No Current Homicidal Plan: No Access to Homicidal Means: No History  of harm to others?: No Assessment of Violence: None Noted Does patient have access to weapons?: No Criminal Charges Pending?: No Does patient have a court date: No Is patient on probation?: No  Psychosis Hallucinations: None noted Delusions: None noted  Mental Status Report Appearance/Hygiene: Unremarkable Eye Contact: Fair Motor Activity: Unremarkable Speech: Logical/coherent Level of Consciousness: Alert Mood: Anxious, Depressed Affect: Anxious, Depressed, Appropriate to circumstance Anxiety Level: Moderate Thought Processes: Coherent, Relevant Judgement: Impaired Orientation: Person, Place, Time, Situation Obsessive Compulsive Thoughts/Behaviors: None  Cognitive Functioning Concentration: Normal Memory: Recent Intact, Remote Intact IQ: Average Insight: see judgement above Impulse Control: Unable to Assess Appetite: Good Sleep: Increased Vegetative Symptoms: None  ADLScreening [facility] Assessment Services) Patient's cognitive ability adequate to safely complete daily activities?: Yes Patient able to express need for assistance with ADLs?: Yes Independently performs ADLs?: Yes (appropriate for developmental age)  Prior Inpatient Therapy Prior Inpatient Therapy: No  Prior Outpatient Therapy Prior Outpatient Therapy: Yes Prior Therapy Dates: 2015 Prior Therapy Facilty/Provider(s): [facility] Reason for Treatment: PTSD; depression Does patient have an ACCT team?: No Does patient have Intensive In-House Services?  : No Does patient have [facility] services? : No Does patient have P4CC services?: No  ADL Screening (condition at time of admission) Patient's cognitive ability adequate to safely complete daily activities?: Yes Is the patient deaf or have difficulty hearing?: No Does the patient have difficulty seeing, even when wearing glasses/contacts?: No Does the patient have difficulty concentrating, remembering, or making decisions?: No Patient able to express need  for assistance with ADLs?: Yes Does the patient have difficulty dressing or bathing?: No Independently performs ADLs?: Yes (appropriate for developmental age) Does the patient have difficulty walking or climbing stairs?: No Weakness of Legs: None Weakness of Arms/Hands: None  Home Assistive Devices/Equipment Home Assistive Devices/Equipment: None  Therapy Consults (therapy consults require a physician order) PT Evaluation Needed: No OT Evalulation Needed: No SLP Evaluation Needed: No Abuse/Neglect Assessment (Assessment to be complete while patient is alone) Physical Abuse: Denies Verbal Abuse: Denies Sexual Abuse: Denies Exploitation of patient/patient's resources: Denies Self-Neglect: Denies Values / Beliefs Cultural Requests During Hospitalization: None Spiritual Requests During Hospitalization: None Consults Spiritual Care Consult Needed: No Social Work Consult Needed: No Merchant navy officer (For Healthcare) Does patient have an advance directive?: No Would patient like information on creating an advanced directive?: No - patient declined information    Additional Information 1:1 In Past 12 Months?: No CIRT Risk: No Elopement Risk: No Does patient have medical clearance?: Yes     Disposition:  Disposition Initial Assessment Completed for this Encounter: Yes (consulted with Fransisca Kaufmann, NP) Disposition of Patient: Inpatient treatment program Type of inpatient treatment program: Adult (pt accepted to [facility] 405-2)  On Site Evaluation by:   Reviewed with Physician:    Laddie Aquas 07/09/2016 11:25 AM

## 2016-07-10 DIAGNOSIS — Z8249 Family history of ischemic heart disease and other diseases of the circulatory system: Secondary | ICD-10-CM

## 2016-07-10 DIAGNOSIS — Z818 Family history of other mental and behavioral disorders: Secondary | ICD-10-CM

## 2016-07-10 DIAGNOSIS — Z811 Family history of alcohol abuse and dependence: Secondary | ICD-10-CM

## 2016-07-10 DIAGNOSIS — Z808 Family history of malignant neoplasm of other organs or systems: Secondary | ICD-10-CM

## 2016-07-10 DIAGNOSIS — Z79899 Other long term (current) drug therapy: Secondary | ICD-10-CM

## 2016-07-10 NOTE — Progress Notes (Signed)
[facility] MD Progress Note  07/10/2016 1:21 PM Luis Russell  MRN:  [id number] Subjective:  Admitted for multiple depressive and anxiety symptoms. Less depressed today and no suiicide thoughts. Doing well on hydroxyzine prn Principal Problem: MDD (major depressive disorder), recurrent episode, severe ([place]) Diagnosis:   Patient Active Problem List   Diagnosis Date Noted  . MDD (major depressive disorder), recurrent episode, severe ([place]) [F33.2] 07/09/2016  . Generalized anxiety disorder [F41.1] 07/09/2016  . Hyperlipidemia [E78.5] 07/09/2016   Total Time spent with patient: 30 minutes  Past Psychiatric History: see admission assessment  Past Medical History:  Past Medical History:  Diagnosis Date  . Anxiety   . Clotting disorder ([place])   . Depression   . Hypercholesterolemia     Past Surgical History:  Procedure Laterality Date  . CYST REMOVAL LEG Left 2001  . INNER EAR SURGERY     yrs ago  . TONSILLECTOMY     yrs ago   Family History:  Family History  Problem Relation Age of Onset  . Cancer Mother   . Depression Mother   . Heart attack Father   . Alcoholism Father   . COPD Sister   . Diabetes Brother   . Heart disease Sister     pacemaker   Family Psychiatric  History: see adm assessment Social History:  History  Alcohol Use  . Yes    Comment: "socially"     History  Drug Use No    Social History   Social History  . Marital status: Married    Spouse name: Santiago Glad  . Number of children: 2  . Years of education: 16   Occupational History  .      na   Social History Main Topics  . Smoking status: Never Smoker  . Smokeless tobacco: Never Used  . Alcohol use Yes     Comment: "socially"  . Drug use: No  . Sexual activity: Yes   Other Topics Concern  . None   Social History Narrative   lives at home with wife, 2 teenage children   Caffeine 1 cup daily   Additional Social History:    Pain Medications: pt denies Prescriptions: prozac;  wellbutrin (started Friday-07/06/16) Over the Counter: pt denies History of alcohol / drug use?: No history of alcohol / drug abuse                    Sleep: Poor  Appetite:  Fair  Current Medications: Current Facility-Administered Medications  Medication Dose Route Frequency Provider Last Rate Last Dose  . acetaminophen (TYLENOL) tablet 650 mg  650 mg Oral Q6H PRN Niel Hummer, NP      . alum & mag hydroxide-simeth (MAALOX/MYLANTA) 200-200-20 MG/5ML suspension 30 mL  30 mL Oral Q4H PRN Niel Hummer, NP      . atorvastatin (LIPITOR) tablet 40 mg  40 mg Oral q1800 Ursula Alert, MD   40 mg at 07/09/16 1814  . buPROPion [facility]) tablet 75 mg  75 mg Oral Q breakfast Ursula Alert, MD   75 mg at 07/10/16 0809  . clonazePAM (KLONOPIN) tablet 0.5 mg  0.5 mg Oral Daily Ursula Alert, MD   0.5 mg at 07/10/16 0809  . clonazePAM (KLONOPIN) tablet 1 mg  1 mg Oral QHS Ursula Alert, MD   1 mg at 07/09/16 2104  . hydrOXYzine (ATARAX/VISTARIL) tablet 25 mg  25 mg Oral Q6H PRN Niel Hummer, NP   25 mg at 07/10/16 1206  .  magnesium hydroxide (MILK OF MAGNESIA) suspension 30 mL  30 mL Oral Daily PRN Niel Hummer, NP      . traZODone (DESYREL) tablet 50 mg  50 mg Oral QHS PRN Niel Hummer, NP   50 mg at 07/09/16 2104  . venlafaxine XR (EFFEXOR-XR) 24 hr capsule 37.5 mg  37.5 mg Oral Q breakfast Ursula Alert, MD   37.5 mg at 07/10/16 0809    Lab Results:  Results for orders placed or performed during the hospital encounter of 07/09/16 (from the past 48 hour(s))  Urine rapid drug screen (hosp performed)not at [facility]     Status: Abnormal   Collection Time: 07/09/16 11:30 AM  Result Value Ref Range   Opiates NONE DETECTED NONE DETECTED   Cocaine NONE DETECTED NONE DETECTED   Benzodiazepines POSITIVE (A) NONE DETECTED   Amphetamines NONE DETECTED NONE DETECTED   Tetrahydrocannabinol NONE DETECTED NONE DETECTED   Barbiturates NONE DETECTED NONE DETECTED    Comment:        DRUG SCREEN  FOR MEDICAL PURPOSES ONLY.  IF CONFIRMATION IS NEEDED FOR ANY PURPOSE, NOTIFY LAB WITHIN 5 DAYS.        LOWEST DETECTABLE LIMITS FOR URINE DRUG SCREEN Drug Class       Cutoff (ng/mL) Amphetamine      1000 Barbiturate      200 Benzodiazepine   326 Tricyclics       712 Opiates          300 Cocaine          300 THC              50 Performed at [facility]   CBC     Status: None   Collection Time: 07/09/16  6:23 PM  Result Value Ref Range   WBC 8.9 4.0 - 10.5 K/uL   RBC 4.51 4.22 - 5.81 MIL/uL   Hemoglobin 13.2 13.0 - 17.0 g/dL   HCT 41.1 39.0 - 52.0 %   MCV 91.1 78.0 - 100.0 fL   MCH 29.3 26.0 - 34.0 pg   MCHC 32.1 30.0 - 36.0 g/dL   RDW 13.5 11.5 - 15.5 %   Platelets 239 150 - 400 K/uL    Comment: Performed at [facility]  Comprehensive metabolic panel     Status: Abnormal   Collection Time: 07/09/16  6:23 PM  Result Value Ref Range   Sodium 138 135 - 145 mmol/L   Potassium 3.9 3.5 - 5.1 mmol/L   Chloride 103 101 - 111 mmol/L   CO2 28 22 - 32 mmol/L   Glucose, Bld 146 (H) 65 - 99 mg/dL   BUN 14 6 - 20 mg/dL   Creatinine, Ser 0.90 0.61 - 1.24 mg/dL   Calcium 9.5 8.9 - 10.3 mg/dL   Total Protein 7.7 6.5 - 8.1 g/dL   Albumin 4.1 3.5 - 5.0 g/dL   AST 28 15 - 41 U/L   ALT 39 17 - 63 U/L   Alkaline Phosphatase 102 38 - 126 U/L   Total Bilirubin 1.0 0.3 - 1.2 mg/dL   GFR calc non Af Amer >60 >60 mL/min   GFR calc Af Amer >60 >60 mL/min    Comment: (NOTE) The eGFR has been calculated using the CKD EPI equation. This calculation has not been validated in all clinical situations. eGFR's persistently <60 mL/min signify possible Chronic Kidney Disease.    Anion gap 7 5 - 15    Comment: Performed  at [facility]  TSH     Status: None   Collection Time: 07/09/16  6:23 PM  Result Value Ref Range   TSH 3.048 0.350 - 4.500 uIU/mL    Comment: Performed at [facility]  Ethanol     Status: None    Collection Time: 07/09/16  6:23 PM  Result Value Ref Range   Alcohol, Ethyl (B) <5 <5 mg/dL    Comment:        LOWEST DETECTABLE LIMIT FOR SERUM ALCOHOL IS 5 mg/dL FOR MEDICAL PURPOSES ONLY Performed at [facility]     Blood Alcohol level:  Lab Results  Component Value Date   [facility] <5 16/07/9603    Metabolic Disorder Labs: Lab Results  Component Value Date   HGBA1C 5.8 (H) 06/08/2016   No results found for: PROLACTIN No results found for: CHOL, TRIG, HDL, CHOLHDL, VLDL, LDLCALC  Physical Findings: AIMS:  , ,  ,  ,    CIWA:    COWS:     Musculoskeletal: Strength & Muscle Tone: within normal limits Gait & Station: normal Patient leans: N/A  Psychiatric Specialty Exam: Physical Exam  ROS  Blood pressure 112/81, pulse (!) 102, temperature 98.2 F (36.8 C), temperature source Oral, resp. rate 16, height '6\' 1"'$  (1.854 m), weight (!) 136.5 kg (301 lb), SpO2 95 %.Body mass index is 39.71 kg/m.  General Appearance: Casual  Eye Contact:  Good  Speech:  Clear and Coherent and Normal Rate  Volume:  Normal  Mood:  Anxious  Affect:  Non-Congruent  Thought Process:  Goal Directed  Orientation:  Full (Time, Place, and Person)  Thought Content:  Logical  Suicidal Thoughts:  No  Homicidal Thoughts:  No  Memory:  Immediate;   Fair Recent;   Fair Remote;   Fair  Judgement:  Fair  Insight:  Fair  Psychomotor Activity:  Normal  Concentration:  Concentration: Fair and Attention Span: Fair  Recall:  [employer] of Knowledge:  Fair  Language:  Fair  Akathisia:  No  Handed:  Right  AIMS (if indicated):     Assets:  Communication Skills Desire for Improvement Financial Resources/Insurance [place] Talents/Skills Transportation Vocational/Educational  ADL's:  Intact  Cognition:  WNL  Sleep:  Number of Hours: 6.5     Treatment Plan Summary: Daily contact with patient to assess and evaluate symptoms  and progress in treatment, Medication management and Plan to continue current treatment plan  Ruffin Frederick, MD 07/10/2016, 1:21 PM

## 2016-07-10 NOTE — [facility] Group Notes (Signed)
[facility] LCSW Group Therapy 07/10/2016 1:15 PM  Type of Therapy: Group Therapy- Feelings about Diagnosis  Participation Level: Reserved  Participation Quality:  Appropriate  Affect:  Appropriate  Cognitive: Alert and Oriented   Insight:  Developing   Engagement in Therapy: Developing/Improving and Engaged   Modes of Intervention: Clarification, Confrontation, Discussion, Education, Exploration, Limit-setting, Orientation, Problem-solving, Rapport Building, Dance movement psychotherapisteality Testing, Socialization and Support  Description of Group:   This group will allow patients to explore their thoughts and feelings about diagnoses they have received. Patients will be guided to explore their level of understanding and acceptance of these diagnoses. Facilitator will encourage patients to process their thoughts and feelings about the reactions of others to their diagnosis, and will guide patients in identifying ways to discuss their diagnosis with significant others in their lives. This group will be process-oriented, with patients participating in exploration of their own experiences as well as giving and receiving support and challenge from other group members.  Summary of Progress/Problems:  Pt expresses that his role as a father is important to him. He participated minimally in group discussion, however remained attentive.  Therapeutic Modalities:   Cognitive Behavioral Therapy Solution Focused Therapy Motivational Interviewing Relapse Prevention Therapy  Chad CordialLauren Carter, LCSWA 07/10/2016 5:09 PM

## 2016-07-10 NOTE — Progress Notes (Signed)
Adult Psychoeducational Group Note  Date:  07/10/2016 Time:  10:06 PM  Group Topic/Focus:  Wrap-Up Group:   The focus of this group is to help patients review their daily goal of treatment and discuss progress on daily workbooks.   Participation Level:  Active  Participation Quality:  Appropriate  Affect:  Appropriate  Cognitive:  Alert, Appropriate and Oriented  Insight: Appropriate  Engagement in Group:  Engaged  Modes of Intervention:  Discussion  Additional Comments:  Patient attended group and said that his day was a 8.  His goal for today was to control his feelings.  Coping skills are deep breathing, and talking to his wife.  Dyneisha Murchison W Raelie Lohr 07/10/2016, 10:06 PM
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
## 2016-07-10 NOTE — Progress Notes (Signed)
D: Patient denies SI/HI and A/V hallucinations; patient reports sleep is fair; reports appetite is good; reports energy level is normal ; reports ability to concentrate is good; rates depression as 5/10; rates hopelessness 0/10; rates anxiety as 0/10; patient came to the writer stating " I need something for depression"; after discussion with the patient, it appears it may have been anxiety; patient given vistaril and patient reports relief  A: Monitored q 15 minutes; patient encouraged to attend groups; patient educated about medications; patient given medications per physician orders; patient encouraged to express feelings and/or concerns  R: Patient is cooperative and pleasant; patient is anxious; patient's interaction with staff and peers is minimal; patient was able to set goal to talk with staff 1:1 when having feelings of SI; patient is taking medications as prescribed and tolerating medications; patient is attending all groups

## 2016-07-10 NOTE — Tx Team (Signed)
Interdisciplinary Treatment and Diagnostic Plan Update  07/10/2016 Time of Session: 8:47 AM  Luis Russell MRN: [id number]  Principal Diagnosis: MDD (major depressive disorder), recurrent episode, severe [name]  Secondary Diagnoses: Principal Problem:   MDD (major depressive disorder), recurrent episode, severe [name] Active Problems:   Generalized anxiety disorder   Hyperlipidemia   Current Medications:  Current Facility-Administered Medications  Medication Dose Route Frequency Provider Last Rate Last Dose  . acetaminophen (TYLENOL) tablet 650 mg  650 mg Oral Q6H PRN Niel Hummer, NP      . alum & mag hydroxide-simeth (MAALOX/MYLANTA) 200-200-20 MG/5ML suspension 30 mL  30 mL Oral Q4H PRN Niel Hummer, NP      . atorvastatin (LIPITOR) tablet 40 mg  40 mg Oral q1800 Ursula Alert, MD   40 mg at 07/09/16 1814  . buPROPion [facility]) tablet 75 mg  75 mg Oral Q breakfast Ursula Alert, MD   75 mg at 07/10/16 0809  . clonazePAM (KLONOPIN) tablet 0.5 mg  0.5 mg Oral Daily Ursula Alert, MD   0.5 mg at 07/10/16 0809  . clonazePAM (KLONOPIN) tablet 1 mg  1 mg Oral QHS Ursula Alert, MD   1 mg at 07/09/16 2104  . hydrOXYzine (ATARAX/VISTARIL) tablet 25 mg  25 mg Oral Q6H PRN Niel Hummer, NP   25 mg at 07/10/16 1206  . magnesium hydroxide (MILK OF MAGNESIA) suspension 30 mL  30 mL Oral Daily PRN Niel Hummer, NP      . traZODone (DESYREL) tablet 50 mg  50 mg Oral QHS PRN Niel Hummer, NP   50 mg at 07/09/16 2104  . venlafaxine XR (EFFEXOR-XR) 24 hr capsule 37.5 mg  37.5 mg Oral Q breakfast Ursula Alert, MD   37.5 mg at 07/10/16 0809    PTA Medications: Prescriptions Prior to Admission  Medication Sig Dispense Refill Last Dose  . atorvastatin (LIPITOR) 40 MG tablet 40 mg daily. 06/08/16 has not taken x 1 1/2 weeks   Past Week at Unknown time  . buPROPion (WELLBUTRIN XL) 150 MG 24 hr tablet 150 mg daily. X 4 days then increase to 2 tablets daily  1 Past Week at Unknown time   . clonazePAM (KLONOPIN) 0.5 MG tablet 0.5 mg daily.    Past Week at Unknown time  . clonazePAM (KLONOPIN) 0.5 MG tablet Take 1 mg by mouth at bedtime.   Past Week at Unknown time  . FLUoxetine (PROZAC) 40 MG capsule 40 mg daily.   Past Week at Unknown time  . hydrOXYzine (VISTARIL) 25 MG capsule TAKE 1, 2, OR 3 CAPSULES DAILY AS NEEDED  2 Past Week at Unknown time  . QUEtiapine (SEROQUEL) 25 MG tablet 25 mg daily.   1 Past Week at Unknown time  . QUEtiapine (SEROQUEL) 50 MG tablet Take 50 mg by mouth at bedtime.   Past Week at Unknown time    Treatment Modalities: Medication Management, Group therapy, Case management,  1 to 1 session with clinician, Psychoeducation, Recreational therapy.   Physician Treatment Plan for Primary Diagnosis: MDD (major depressive disorder), recurrent episode, severe [name] Long Term Goal(s): Improvement in symptoms so as ready for discharge  Short Term Goals: Ability to disclose and discuss suicidal ideas and Ability to identify and develop effective coping behaviors will improve  Medication Management: Evaluate patient's response, side effects, and tolerance of medication regimen.  Therapeutic Interventions: 1 to 1 sessions, Unit Group sessions and Medication administration.  Evaluation of Outcomes: Not Met  Physician  Treatment Plan for Secondary Diagnosis: Principal Problem:   MDD (major depressive disorder), recurrent episode, severe [name] Active Problems:   Generalized anxiety disorder   Hyperlipidemia   Long Term Goal(s): Improvement in symptoms so as ready for discharge  Short Term Goals: Ability to disclose and discuss suicidal ideas and Ability to identify and develop effective coping behaviors will improve  Medication Management: Evaluate patient's response, side effects, and tolerance of medication regimen.  Therapeutic Interventions: 1 to 1 sessions, Unit Group sessions and Medication administration.  Evaluation of Outcomes: Not  Met   RN Treatment Plan for Primary Diagnosis: MDD (major depressive disorder), recurrent episode, severe [name] Long Term Goal(s): Knowledge of disease and therapeutic regimen to maintain health will improve  Short Term Goals: Ability to disclose and discuss suicidal ideas and Ability to identify and develop effective coping behaviors will improve  Medication Management: RN will administer medications as ordered by provider, will assess and evaluate patient's response and provide education to patient for prescribed medication. RN will report any adverse and/or side effects to prescribing provider.  Therapeutic Interventions: 1 on 1 counseling sessions, Psychoeducation, Medication administration, Evaluate responses to treatment, Monitor vital signs and CBGs as ordered, Perform/monitor CIWA, COWS, AIMS and Fall Risk screenings as ordered, Perform wound care treatments as ordered.  Evaluation of Outcomes: Not Met   LCSW Treatment Plan for Primary Diagnosis: MDD (major depressive disorder), recurrent episode, severe [name] Long Term Goal(s): Safe transition to appropriate next level of care at discharge, Engage patient in therapeutic group addressing interpersonal concerns.  Short Term Goals: Engage patient in aftercare planning with referrals and resources, Increase emotional regulation, Identify triggers associated with mental health/substance abuse issues and Increase skills for wellness and recovery  Therapeutic Interventions: Assess for all discharge needs, 1 to 1 time with Social worker, Explore available resources and support systems, Assess for adequacy in community support network, Educate family and significant other(s) on suicide prevention, Complete Psychosocial Assessment, Interpersonal group therapy.  Evaluation of Outcomes: Not Met   Progress in Treatment: Attending groups: Pt is new to milieu, continuing to assess  Participating in groups: Pt is new to milieu, continuing to  assess  Taking medication as prescribed: Yes, MD continues to assess for medication changes as needed Toleration medication: Yes, no side effects reported at this time Family/Significant other contact made: No, CSW attempting to make contact with wife Patient understands diagnosis: Continuing to assess Discussing patient identified problems/goals with staff: Yes Medical problems stabilized or resolved: Yes Denies suicidal/homicidal ideation: Yes Issues/concerns per patient self-inventory: None Other: N/A  New problem(s) identified: None identified at this time.   New Short Term/Long Term Goal(s): None identified at this time.   Discharge Plan or Barriers: Pt will return home and follow-up with outpatient services.   Reason for Continuation of Hospitalization: Anxiety Depression Medication stabilization Suicidal ideation  Estimated Length of Stay: 3-5 days  Attendees: Patient: 07/10/2016  8:47 AM  Physician: Dr. Ananias Pilgrim 07/10/2016  8:47 AM  Nursing:  07/10/2016  8:47 AM  RN Care Manager: Lars Pinks, RN 07/10/2016  8:47 AM  Social Worker: Peri Maris, LCSW; Kristin Drinkard, LCSW 07/10/2016  8:47 AM  Recreational Therapist:  07/10/2016  8:47 AM  Other: Lindell Spar, NP; Samuel Jester, NP 07/10/2016  8:47 AM  Other:  07/10/2016  8:47 AM  Other: 07/10/2016  8:47 AM    Scribe for Treatment Team: Bo Mcclintock, LCSW 07/10/2016 8:47 AM

## 2016-07-10 NOTE — Progress Notes (Signed)
D: Denies SI/HI/AVH at this time. Contracts for safety. Rates Anxiety and Depression 0/10. A: Encouragement and support given. Q15 minute room checks for patient safety. Medications administered as prescribed.  R: Continue to monitor for patient safety and medication effectiveness.

## 2016-07-11 NOTE — Progress Notes (Signed)
D: Patient denies SI/HI and A/V hallucinations; patient reports sleep is good; reports appetite is good; reports energy level is normal; reports ability to pay attention to; rates depression as 0/10; rates hopelessness 0/10; rates anxiety as 0/10; the patient reports that he is concerned about discharge because he knows that insurance only pays for 3-5 days; patient reported anxiety as well  A: Monitored q 15 minutes; patient encouraged to attend groups; patient educated about medications; patient given medications per physician orders; patient encouraged to express feelings and/or concerns  R: Patient is cooperative and pleasant; patient is anxious at times but is having some relief; patient is attending groups; patient engages with staff and peers

## 2016-07-11 NOTE — Progress Notes (Signed)
[facility] MD Progress Note  07/11/2016 12:33 PM Luis Russell  MRN:  [id number] Subjective:  Admitted for multiple depressive and anxiety symptoms. Discussed patient today with Team who report patient having vistaril prn x2 yesterday. Met with patient who reports less anxiety today and no depressed mood , Will like to follow up at the [facility] upon discharge. Principal Problem: MDD (major depressive disorder), recurrent episode, severe ([place]) Diagnosis:   Patient Active Problem List   Diagnosis Date Noted  . MDD (major depressive disorder), recurrent episode, severe ([place]) [F33.2] 07/09/2016  . Generalized anxiety disorder [F41.1] 07/09/2016  . Hyperlipidemia [E78.5] 07/09/2016   Total Time spent with patient: 30 minutes  Past Psychiatric History: see admission assessment  Past Medical History:  Past Medical History:  Diagnosis Date  . Anxiety   . Clotting disorder ([place])   . Depression   . Hypercholesterolemia     Past Surgical History:  Procedure Laterality Date  . CYST REMOVAL LEG Left 2001  . INNER EAR SURGERY     yrs ago  . TONSILLECTOMY     yrs ago   Family History:  Family History  Problem Relation Age of Onset  . Cancer Mother   . Depression Mother   . Heart attack Father   . Alcoholism Father   . COPD Sister   . Diabetes Brother   . Heart disease Sister     pacemaker   Family Psychiatric  History: see adm assessment Social History:  History  Alcohol Use  . Yes    Comment: "socially"     History  Drug Use No    Social History   Social History  . Marital status: Married    Spouse name: Santiago Glad  . Number of children: 2  . Years of education: 16   Occupational History  .      na   Social History Main Topics  . Smoking status: Never Smoker  . Smokeless tobacco: Never Used  . Alcohol use Yes     Comment: "socially"  . Drug use: No  . Sexual activity: Yes   Other Topics Concern  . None   Social History Narrative   lives at home with  wife, 2 teenage children   Caffeine 1 cup daily   Additional Social History:    Pain Medications: pt denies Prescriptions: prozac; wellbutrin (started Friday-07/06/16) Over the Counter: pt denies History of alcohol / drug use?: No history of alcohol / drug abuse                    Sleep: Poor  Appetite:  Fair  Current Medications: Current Facility-Administered Medications  Medication Dose Route Frequency Provider Last Rate Last Dose  . acetaminophen (TYLENOL) tablet 650 mg  650 mg Oral Q6H PRN Niel Hummer, NP      . alum & mag hydroxide-simeth (MAALOX/MYLANTA) 200-200-20 MG/5ML suspension 30 mL  30 mL Oral Q4H PRN Niel Hummer, NP      . atorvastatin (LIPITOR) tablet 40 mg  40 mg Oral q1800 Ursula Alert, MD   40 mg at 07/10/16 1719  . buPROPion [facility]) tablet 75 mg  75 mg Oral Q breakfast Ursula Alert, MD   75 mg at 07/11/16 0817  . clonazePAM (KLONOPIN) tablet 0.5 mg  0.5 mg Oral Daily Ursula Alert, MD   0.5 mg at 07/11/16 0817  . clonazePAM (KLONOPIN) tablet 1 mg  1 mg Oral QHS Ursula Alert, MD   1 mg at 07/10/16  2234  . hydrOXYzine (ATARAX/VISTARIL) tablet 25 mg  25 mg Oral Q6H PRN Niel Hummer, NP   25 mg at 07/11/16 1109  . magnesium hydroxide (MILK OF MAGNESIA) suspension 30 mL  30 mL Oral Daily PRN Niel Hummer, NP      . traZODone (DESYREL) tablet 50 mg  50 mg Oral QHS PRN Niel Hummer, NP   50 mg at 07/10/16 2233  . venlafaxine XR (EFFEXOR-XR) 24 hr capsule 37.5 mg  37.5 mg Oral Q breakfast Ursula Alert, MD   37.5 mg at 07/11/16 9937    Lab Results:  Results for orders placed or performed during the hospital encounter of 07/09/16 (from the past 48 hour(s))  CBC     Status: None   Collection Time: 07/09/16  6:23 PM  Result Value Ref Range   WBC 8.9 4.0 - 10.5 K/uL   RBC 4.51 4.22 - 5.81 MIL/uL   Hemoglobin 13.2 13.0 - 17.0 g/dL   HCT 41.1 39.0 - 52.0 %   MCV 91.1 78.0 - 100.0 fL   MCH 29.3 26.0 - 34.0 pg   MCHC 32.1 30.0 - 36.0 g/dL   RDW  13.5 11.5 - 15.5 %   Platelets 239 150 - 400 K/uL    Comment: Performed at [facility]  Comprehensive metabolic panel     Status: Abnormal   Collection Time: 07/09/16  6:23 PM  Result Value Ref Range   Sodium 138 135 - 145 mmol/L   Potassium 3.9 3.5 - 5.1 mmol/L   Chloride 103 101 - 111 mmol/L   CO2 28 22 - 32 mmol/L   Glucose, Bld 146 (H) 65 - 99 mg/dL   BUN 14 6 - 20 mg/dL   Creatinine, Ser 0.90 0.61 - 1.24 mg/dL   Calcium 9.5 8.9 - 10.3 mg/dL   Total Protein 7.7 6.5 - 8.1 g/dL   Albumin 4.1 3.5 - 5.0 g/dL   AST 28 15 - 41 U/L   ALT 39 17 - 63 U/L   Alkaline Phosphatase 102 38 - 126 U/L   Total Bilirubin 1.0 0.3 - 1.2 mg/dL   GFR calc non Af Amer >60 >60 mL/min   GFR calc Af Amer >60 >60 mL/min    Comment: (NOTE) The eGFR has been calculated using the CKD EPI equation. This calculation has not been validated in all clinical situations. eGFR's persistently <60 mL/min signify possible Chronic Kidney Disease.    Anion gap 7 5 - 15    Comment: Performed at [facility]  TSH     Status: None   Collection Time: 07/09/16  6:23 PM  Result Value Ref Range   TSH 3.048 0.350 - 4.500 uIU/mL    Comment: Performed at [facility]  Ethanol     Status: None   Collection Time: 07/09/16  6:23 PM  Result Value Ref Range   Alcohol, Ethyl (B) <5 <5 mg/dL    Comment:        LOWEST DETECTABLE LIMIT FOR SERUM ALCOHOL IS 5 mg/dL FOR MEDICAL PURPOSES ONLY Performed at [facility]     Blood Alcohol level:  Lab Results  Component Value Date   [facility] <5 16/96/7893    Metabolic Disorder Labs: Lab Results  Component Value Date   HGBA1C 5.8 (H) 06/08/2016   No results found for: PROLACTIN No results found for: CHOL, TRIG, HDL, CHOLHDL, VLDL, LDLCALC  Physical Findings: AIMS:  , ,  ,  ,  CIWA:    COWS:     Musculoskeletal: Strength & Muscle Tone: within normal limits Gait & Station: normal Patient leans:  N/A  Psychiatric Specialty Exam: Physical Exam  ROS  Blood pressure 99/72, pulse 92, temperature 98.4 F (36.9 C), temperature source Oral, resp. rate 18, height _0  (1.854 m), weight (!) 136.5 kg (301 lb), SpO2 95 %.Body mass index is 39.71 kg/m.  General Appearance: Casual  Eye Contact:  Good  Speech:  Clear and Coherent and Normal Rate  Volume:  Normal  Mood:Less  Anxious  Affect:  Non-Congruent  Thought Process:  Goal Directed  Orientation:  Full (Time, Place, and Person)  Thought Content:  Logical  Suicidal Thoughts:  No  Homicidal Thoughts:  No  Memory:  Immediate;   Fair Recent;   Fair Remote;   Fair  Judgement:  Fair  Insight:  Fair  Psychomotor Activity:  Normal  Concentration:  Concentration: Fair and Attention Span: Fair  Recall:  [employer] of Knowledge:  Fair  Language:  Fair  Akathisia:  No  Handed:  Right  AIMS (if indicated):     Assets:  Communication Skills Desire for Improvement Financial Resources/Insurance [place] Talents/Skills Transportation Vocational/Educational  ADL's:  Intact  Cognition:  WNL  Sleep:  Number of Hours: 5.5     Treatment Plan Summary: Daily contact with patient to assess and evaluate symptoms and progress in treatment, Medication management and Plan to continue current treatment plan  Possible D/C Tomorrow  Ruffin Frederick, MD 07/11/2016, 12:33 PM

## 2016-07-11 NOTE — Progress Notes (Signed)
Adult Psychoeducational Group Note  Date:  07/11/2016 Time:  9:49 PM  Group Topic/Focus:  Wrap-Up Group:   The focus of this group is to help patients review their daily goal of treatment and discuss progress on daily workbooks.   Participation Level:  Active  Participation Quality:  Appropriate  Affect:  Appropriate  Cognitive:  Appropriate  Insight: Good  Engagement in Group:  Engaged  Modes of Intervention:  Discussion  Additional Comments:  Patient goal was to get his emotions in check and was able to control it. He accomplished his goal by looking outside the window enjoying the sun. [name], Berlinda Farve H 07/11/2016, 9:49 PM

## 2016-07-11 NOTE — [facility] Group Notes (Signed)
[facility] LCSW Group Therapy 07/11/2016 1:15 PM  Type of Therapy: Group Therapy- Emotion Regulation  Pt did not attend, declined invitation.    Chad CordialLauren Carter, LCSWA 07/11/2016 5:19 PM

## 2016-07-11 NOTE — Progress Notes (Signed)
Recreation Therapy Notes  Date: 07/11/16 Time: 0930 Location: 300 [address] Dayroom  Group Topic: Stress Management  Goal Area(s) Addresses:  Patient will verbalize importance of using healthy stress management.  Patient will identify positive emotions associated with healthy stress management.   Behavioral Response: Engaged  Intervention: Stress Management  Activity :  Tax inspectoravorite Place.  LRT introduced the technique of guided imagery to patients.  LRT read a script that allowed patients to engage in the activity.  Patients were to listen as LRT read the script to participate in the technique.  Education:  Stress Management, Discharge Planning.   Education Outcome: Acknowledges edcuation/In group clarification offered/Needs additional education  Clinical Observations/Feedback: Pt attended group.   Luis RancherMarjette Pachia Russell, LRT/CTRS         Lillia AbedLindsay, Jessyca Sloan A 07/11/2016 1:10 PM

## 2016-07-12 MED ORDER — VENLAFAXINE HCL ER 37.5 MG PO CP24: 37.5000 mg | ORAL_CAPSULE | Freq: Every day | ORAL | 0 refills | Status: DC

## 2016-07-12 MED ORDER — HYDROXYZINE HCL 25 MG PO TABS: 25.0000 mg | ORAL_TABLET | Freq: Four times a day (QID) | ORAL | 0 refills | Status: DC | PRN

## 2016-07-12 MED ORDER — TRAZODONE HCL 50 MG PO TABS: 50.0000 mg | ORAL_TABLET | Freq: Every evening | ORAL | 0 refills | Status: DC | PRN

## 2016-07-12 MED ORDER — CLONAZEPAM 1 MG PO TABS: 1.0000 mg | ORAL_TABLET | Freq: Every day | ORAL | 0 refills | Status: DC

## 2016-07-12 NOTE — Plan of Care (Signed)
Problem: Medication: Goal: Compliance with prescribed medication regimen will improve Outcome: Progressing Pt has been compliant with scheduled medication tonight.    

## 2016-07-12 NOTE — Progress Notes (Signed)
Patient discharged home. DC instructions provided and explained. Medications reviewed. Rx given. Belongings returned from locker. Transition and risk assessment given to patient. Denies SI, HI, AVH. Pt stable at discharge.

## 2016-07-12 NOTE — Progress Notes (Signed)
Adult Psychoeducational Group Note  Date:  07/12/2016 Time:  9:31 AM  Group Topic/Focus:  Orientation:   The focus of this group is to educate the patient on the purpose and policies of crisis stabilization and provide a format to answer questions about their admission.  The group details unit policies and expectations of patients while admitted.   Participation Level:  Minimal  Participation Quality:  Appropriate  Affect:  Appropriate  Cognitive:  Appropriate  Insight: Good  Engagement in Group:  Engaged  Modes of Intervention:  Discussion  Additional Comments:   Shelia MediaJones, Analysse Quinonez 07/12/2016, 9:31 AM

## 2016-07-12 NOTE — Discharge Summary (Signed)
Physician Discharge Summary Note  Patient:  Luis DumasWarren Adamos Russell is an 50 y.o., male MRN:  [id number] DOB:  08/02/1966 Patient phone:  [phone] (home)  Patient address:   [address] [place] [id number],  Total Time spent with patient: 45 minutes  Date of Admission:  07/09/2016 Date of Discharge: 07/12/16  Reason for Admission:   Luis Russell a 50 y.o.caucasian male, married , [place] , has a hx of depression , GAD , as well as HLD, who [facility] for worsening SI . Per initial notes in EHR : ' [facility] and reporting many risk factors for suicide. Pt reports having active SI for the past 2 weeks and having plans since yesterday. Pt has a plan to slit his wrists with a knife. Pt is on long term disability and is home alone during the day to be able to carry out his plan. Pt just started on Welbutrin on Friday. He is followed for psychiatry with Dr. Donell BeersPlovsky and therapy with the [facility]. "  Patient seen and chart reviewed today .Discussed patient with treatment team. Pt today is seen as very restless, anxious , unable to sit still - kept shaking his LE during evaluation. 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 Pt reports all this is making him too nervous and he keeps thinking about suicide. Pt reports he has never been admitted to a psychiatric IP unit before , but he sees Dr.Pvlosky at [facility] - who is treating him for MDD and GAD. Pt reports being on Prozac 80 mg which was reduced to 40 mg for lack of efficacy. Pt was also started on Wellbutrin now at 100 mg po bid a week ago- pt reports he became more anxious after the medications changes. Pt is also getting therapy [facility] - see therapist every 2 weeks.  Principal Problem: MDD (major  depressive disorder), recurrent episode, severe [facility](HCC) Discharge Diagnoses: Patient Active Problem List   Diagnosis Date Noted  . MDD (major depressive disorder), recurrent episode, severe (HCC) [F33.2] 07/09/2016  . Generalized anxiety disorder [F41.1] 07/09/2016  . Hyperlipidemia [E78.5] 07/09/2016    Past Psychiatric History: SEE H&P  Past Medical History:  Past Medical History:  Diagnosis Date  . Anxiety   . Clotting disorder (HCC)   . Depression   . Hypercholesterolemia     Past Surgical History:  Procedure Laterality Date  . CYST REMOVAL LEG Left 2001  . INNER EAR SURGERY     yrs ago  . TONSILLECTOMY     yrs ago   Family History:  Family History  Problem Relation Age of Onset  . Cancer Mother   . Depression Mother   . Heart attack Father   . Alcoholism Father   . COPD Sister   . Diabetes Brother   . Heart disease Sister     pacemaker   Family Psychiatric  History: See H&P Social History:  History  Alcohol Use  . Yes    Comment: "socially"     History  Drug Use No    Social History   Social History  . Marital status: Married    Spouse name: Clydie BraunKaren  . Number of children: 2  . Years of education: 5216  Occupational History  .      na   Social History Main Topics  . Smoking status: Never Smoker  . Smokeless tobacco: Never Used  . Alcohol use Yes     Comment: "socially"  . Drug use: No  . Sexual activity: Yes   Other Topics Concern  . None   Social History Narrative   lives at home with wife, 2 teenage children   Caffeine 1 cup daily    Hospital Course:   [facility] [place] was admitted for Severe episode of recurrent major depressive disorder, without psychotic features (HCC) , and crisis management.  Pt was treated discharged with the medications listed below under Medication List.  Medical problems were identified and treated as needed.  Home medications were restarted as appropriate.  Improvement was monitored by observation and  Luis Russell daily report of symptom reduction.  Emotional and mental status was monitored by daily self-inventory reports completed by Luis Russell and clinical staff.         Luis Russell was evaluated by the treatment team for stability and plans for continued recovery upon discharge. Luis Russell 's motivation was an integral factor for scheduling further treatment. Employment, transportation, bed availability, health status, family support, and any pending legal issues were also considered during hospital stay. Pt was offered further treatment options upon discharge including but not limited to Residential, Intensive Outpatient, and Outpatient treatment.  Luis Russell will follow up with the services as listed below under Follow Up Information.     Upon completion of this admission the patient was both mentally and medically stable for discharge denying suicidal/homicidal ideation, auditory/visual/tactile hallucinations, delusional thoughts and paranoia.    Luis Russell responded well to treatment with Prozac, Vistaril, Trazodone, and Effexor without adverse effects. Pt demonstrated improvement without reported or observed adverse effects to the point of stability appropriate for outpatient management.  Reviewed CBC, CMP, BAL, and UDS; all unremarkable aside from noted exceptions.   Physical Findings: AIMS:  , ,  ,  ,    CIWA:    COWS:     Musculoskeletal: Strength & Muscle Tone: within normal limits Gait & Station: normal Patient leans: N/A  Psychiatric Specialty Exam: Physical Exam  Review of Systems  Psychiatric/Behavioral: Positive for depression. Negative for hallucinations, substance abuse and suicidal ideas. The patient is nervous/anxious and has insomnia.   All other systems reviewed and are negative.   Blood pressure 95/70, pulse 88, temperature 98.1 F (36.7 C), temperature source Oral, resp. rate 16, height 6\' 1"  (1.854 m),  weight (!) 136.5 kg (301 lb), SpO2 95 %.Body mass index is 39.71 kg/m.  SEE MD PSE within the SRA     Have you used any form of tobacco in the last 30 days? (Cigarettes, Smokeless Tobacco, Cigars, and/or Pipes): No  Has this patient used any form of tobacco in the last 30 days? (Cigarettes, Smokeless Tobacco, Cigars, and/or Pipes) No  Blood Alcohol level:  Lab Results  Component Value Date   ETH <5 07/09/2016    Metabolic Disorder Labs:  Lab Results  Component Value Date   HGBA1C 5.8 (H) 06/08/2016   No results found for: PROLACTIN No results found for: CHOL, TRIG, HDL, CHOLHDL, VLDL, LDLCALC  See Psychiatric Specialty Exam and Suicide Risk Assessment completed by Attending Physician prior to discharge.  Discharge destination:  Home  Is patient on multiple antipsychotic therapies at discharge:  No   Has Patient had three  or more failed trials of antipsychotic monotherapy by history:  No  Recommended Plan for Multiple Antipsychotic Therapies: NA     Medication List    STOP taking these medications   buPROPion 150 MG 24 hr tablet Commonly known as:  WELLBUTRIN XL   FLUoxetine 40 MG capsule Commonly known as:  PROZAC   hydrOXYzine 25 MG capsule Commonly known as:  VISTARIL   QUEtiapine 25 MG tablet Commonly known as:  SEROQUEL   QUEtiapine 50 MG tablet Commonly known as:  SEROQUEL     TAKE these medications     Indication  atorvastatin 40 MG tablet Commonly known as:  LIPITOR 40 mg daily. 06/08/16 has not taken x 1 1/2 weeks  Indication:  hyperlipidemia   clonazePAM 1 MG tablet Commonly known as:  KLONOPIN Take 1 tablet (1 mg total) by mouth at bedtime. What changed:  medication strength  how much to take  how to take this  when to take this  Another medication with the same name was removed. Continue taking this medication, and follow the directions you see here.  Indication:  anxiety   hydrOXYzine 25 MG tablet Commonly known as:   ATARAX/VISTARIL Take 1 tablet (25 mg total) by mouth every 6 (six) hours as needed for anxiety.  Indication:  Anxiety Neurosis   traZODone 50 MG tablet Commonly known as:  DESYREL Take 1 tablet (50 mg total) by mouth at bedtime as needed for sleep.  Indication:  Trouble Sleeping   venlafaxine XR 37.5 MG 24 hr capsule Commonly known as:  EFFEXOR-XR Take 1 capsule (37.5 mg total) by mouth daily with breakfast. Start taking on:  07/13/2016  Indication:  MDD      Follow-up Information    [facility] Follow up on 07/30/2016.   Why:  Medications management w Dr Donell Beers, next appointment is 10/2 at 11:45 AM.  Bring hospital discharge paperwork to appointment.  Call to cancel/reschedule if needed.  CSW was unable to schedule any sooner w this provider.   Contact information: [address], [place], [place] [address]   Phone # [phone]   Fax # [phone]          [facility] Follow up on 07/16/2016.   Why:  Patient current w therapist Efraim Kaufmann, next appointment is 9/18 at 12:30 PM.  Please call to cancel/reschedule if needed.  Contact information: [address] [place]  [place], [place] [address] Phone: [phone] Fax:  [phone]       [facility] Follow up on 07/16/2016.   Specialty:  Behavioral Health Why:  at 8:45am with [place] Sink for continuation with [facility] Contact information: [address] [id number] mc [address] [place] [address] [phone]          Follow-up recommendations:   Diet: Heart healthy Activity: As tolerated  Comments:   Take all medications as prescribed. Keep all follow-up appointments as scheduled.  Do not consume alcohol or use illegal drugs while on prescription medications. Report any adverse effects from your medications to your primary care provider promptly.  In the event of recurrent symptoms or worsening symptoms, call 911, a crisis hotline, or go to the nearest emergency  department for evaluation.   Signed: Beau Fanny, FNP 07/12/2016, 11:57 AM

## 2016-07-12 NOTE — Tx Team (Signed)
Interdisciplinary Treatment and Diagnostic Plan Update  07/12/2016 Time of Session: 10:11 AM  Luis Russell MRN: [id number]  Principal Diagnosis: MDD (major depressive disorder), recurrent episode, severe (HCC)  Secondary Diagnoses: Principal Problem:   MDD (major depressive disorder), recurrent episode, severe (HCC) Active Problems:   Generalized anxiety disorder   Hyperlipidemia   Current Medications:  Current Facility-Administered Medications  Medication Dose Route Frequency Provider Last Rate Last Dose  . acetaminophen (TYLENOL) tablet 650 mg  650 mg Oral Q6H PRN Thermon Leyland, NP      . alum & mag hydroxide-simeth (MAALOX/MYLANTA) 200-200-20 MG/5ML suspension 30 mL  30 mL Oral Q4H PRN Thermon Leyland, NP      . atorvastatin (LIPITOR) tablet 40 mg  40 mg Oral q1800 Jomarie Longs, MD   40 mg at 07/11/16 1727  . buPROPion [facility]) tablet 75 mg  75 mg Oral Q breakfast Jomarie Longs, MD   75 mg at 07/12/16 0811  . clonazePAM (KLONOPIN) tablet 0.5 mg  0.5 mg Oral Daily Jomarie Longs, MD   0.5 mg at 07/12/16 0811  . clonazePAM (KLONOPIN) tablet 1 mg  1 mg Oral QHS Jomarie Longs, MD   1 mg at 07/11/16 2103  . hydrOXYzine (ATARAX/VISTARIL) tablet 25 mg  25 mg Oral Q6H PRN Thermon Leyland, NP   25 mg at 07/11/16 1109  . magnesium hydroxide (MILK OF MAGNESIA) suspension 30 mL  30 mL Oral Daily PRN Thermon Leyland, NP      . traZODone (DESYREL) tablet 50 mg  50 mg Oral QHS PRN Thermon Leyland, NP   50 mg at 07/10/16 2233  . venlafaxine XR (EFFEXOR-XR) 24 hr capsule 37.5 mg  37.5 mg Oral Q breakfast Jomarie Longs, MD   37.5 mg at 07/12/16 4098    PTA Medications: Prescriptions Prior to Admission  Medication Sig Dispense Refill Last Dose  . atorvastatin (LIPITOR) 40 MG tablet 40 mg daily. 06/08/16 has not taken x 1 1/2 weeks   Past Week at Unknown time  . buPROPion (WELLBUTRIN XL) 150 MG 24 hr tablet 150 mg daily. X 4 days then increase to 2 tablets daily  1 Past Week at Unknown time   . clonazePAM (KLONOPIN) 0.5 MG tablet 0.5 mg daily.    Past Week at Unknown time  . clonazePAM (KLONOPIN) 0.5 MG tablet Take 1 mg by mouth at bedtime.   Past Week at Unknown time  . FLUoxetine (PROZAC) 40 MG capsule 40 mg daily.   Past Week at Unknown time  . hydrOXYzine (VISTARIL) 25 MG capsule TAKE 1, 2, OR 3 CAPSULES DAILY AS NEEDED  2 Past Week at Unknown time  . QUEtiapine (SEROQUEL) 25 MG tablet 25 mg daily.   1 Past Week at Unknown time  . QUEtiapine (SEROQUEL) 50 MG tablet Take 50 mg by mouth at bedtime.   Past Week at Unknown time    Treatment Modalities: Medication Management, Group therapy, Case management,  1 to 1 session with clinician, Psychoeducation, Recreational therapy.   Physician Treatment Plan for Primary Diagnosis: MDD (major depressive disorder), recurrent episode, severe (HCC) Long Term Goal(s): Improvement in symptoms so as ready for discharge  Short Term Goals: Ability to disclose and discuss suicidal ideas and Ability to identify and develop effective coping behaviors will improve  Medication Management: Evaluate patient's response, side effects, and tolerance of medication regimen.  Therapeutic Interventions: 1 to 1 sessions, Unit Group sessions and Medication administration.  Evaluation of Outcomes: Adequate for Discharge  Physician Treatment Plan for Secondary Diagnosis: Principal Problem:   MDD (major depressive disorder), recurrent episode, severe (HCC) Active Problems:   Generalized anxiety disorder   Hyperlipidemia   Long Term Goal(s): Improvement in symptoms so as ready for discharge  Short Term Goals: Ability to disclose and discuss suicidal ideas and Ability to identify and develop effective coping behaviors will improve  Medication Management: Evaluate patient's response, side effects, and tolerance of medication regimen.  Therapeutic Interventions: 1 to 1 sessions, Unit Group sessions and Medication administration.  Evaluation of  Outcomes: Adequate for Discharge   RN Treatment Plan for Primary Diagnosis: MDD (major depressive disorder), recurrent episode, severe (HCC) Long Term Goal(s): Knowledge of disease and therapeutic regimen to maintain health will improve  Short Term Goals: Ability to disclose and discuss suicidal ideas and Ability to identify and develop effective coping behaviors will improve  Medication Management: RN will administer medications as ordered by provider, will assess and evaluate patient's response and provide education to patient for prescribed medication. RN will report any adverse and/or side effects to prescribing provider.  Therapeutic Interventions: 1 on 1 counseling sessions, Psychoeducation, Medication administration, Evaluate responses to treatment, Monitor vital signs and CBGs as ordered, Perform/monitor CIWA, COWS, AIMS and Fall Risk screenings as ordered, Perform wound care treatments as ordered.  Evaluation of Outcomes: Adequate for Discharge   LCSW Treatment Plan for Primary Diagnosis: MDD (major depressive disorder), recurrent episode, severe (HCC) Long Term Goal(s): Safe transition to appropriate next level of care at discharge, Engage patient in therapeutic group addressing interpersonal concerns.  Short Term Goals: Engage patient in aftercare planning with referrals and resources, Increase emotional regulation, Identify triggers associated with mental health/substance abuse issues and Increase skills for wellness and recovery  Therapeutic Interventions: Assess for all discharge needs, 1 to 1 time with Social worker, Explore available resources and support systems, Assess for adequacy in community support network, Educate family and significant other(s) on suicide prevention, Complete Psychosocial Assessment, Interpersonal group therapy.  Evaluation of Outcomes: Adequate for Discharge   Progress in Treatment: Attending groups: Yes Participating in groups: Minimally Taking  medication as prescribed: Yes, MD continues to assess for medication changes as needed Toleration medication: Yes, no side effects reported at this time Family/Significant other contact made: No, Pt declines Patient understands diagnosis: Continuing to assess Discussing patient identified problems/goals with staff: Yes Medical problems stabilized or resolved: Yes Denies suicidal/homicidal ideation: Yes Issues/concerns per patient self-inventory: None Other: N/A  New problem(s) identified: None identified at this time.   New Short Term/Long Term Goal(s): None identified at this time.   Discharge Plan or Barriers: Pt will return home and follow-up with outpatient services.   Reason for Continuation of Hospitalization: Anxiety Depression Medication stabilization Suicidal ideation  Estimated Length of Stay: 3-5 days  Attendees: Patient: 07/12/2016  10:11 AM  Physician: Dr. Seth BakeLekwauwa 07/12/2016  10:11 AM  Nursing: Liborio NixonPatrice White, RN; Shelia MediaJanet Jones, RN 07/12/2016  10:11 AM  RN Care Manager: Onnie BoerJennifer Clark, RN 07/12/2016  10:11 AM  Social Worker: Chad CordialLauren Carter, LCSW; Belenda CruiseKristin Drinkard, LCSW 07/12/2016  10:11 AM  Recreational Therapist:  07/12/2016  10:11 AM  Other: Armandina StammerAgnes Nwoko, NP; Gray BernhardtMay Augustin, NP 07/12/2016  10:11 AM  Other:  07/12/2016  10:11 AM  Other: 07/12/2016  10:11 AM    Scribe for Treatment Team: Elaina HoopsLauren M Carter, LCSW 07/12/2016 10:11 AM

## 2016-07-12 NOTE — Progress Notes (Signed)
D: Pt was in the day room upon initial approach.  Pt presents with appropriate affect and mood.  He states "I had a good day."  His goal was to "be positive, control my emotions."  Pt reports he met his goal.  He reports he had a good visit with his wife tonight.  Pt denies SI/HI, denies hallucinations, denies pain.  Pt has been visible in milieu interacting with peers and staff appropriately.  Pt attended evening group.   A: Introduced self to pt.  Met with pt and offered support and encouragement.  Medication administered per order.   R: Pt is compliant with medication.  Pt verbally contracts for safety.  Will continue to monitor and assess.

## 2016-07-12 NOTE — [facility] Suicide Risk Assessment (Signed)
[facility] INPATIENT:  Family/Significant Other Suicide Prevention Education  Suicide Prevention Education:  Patient Refusal for Family/Significant Other Suicide Prevention Education: The patient Luis DumasWarren Adamos Cashaw has refused to provide written consent for family/significant other to be provided Family/Significant Other Suicide Prevention Education during admission and/or prior to discharge.  Physician notified.  Elaina HoopsLauren M Carter 07/12/2016, 10:18 AM

## 2016-07-12 NOTE — [facility] Suicide Risk Assessment (Signed)
[facility]   Principal Problem: MDD (major depressive disorder), recurrent episode, severe (HCC) Discharge Diagnoses:  Patient Active Problem List   Diagnosis Date Noted  . MDD (major depressive disorder), recurrent episode, severe (HCC) [F33.2] 07/09/2016  . Generalized anxiety disorder [F41.1] 07/09/2016  . Hyperlipidemia [E78.5] 07/09/2016    Total Time spent with patient: 30 minutes  Musculoskeletal: Strength & Muscle Tone: within normal limits Gait & Station: normal Patient leans: N/A  Psychiatric Specialty Exam: ROS  Blood pressure 95/70, pulse 88, temperature 98.1 F (36.7 C), temperature source Oral, resp. rate 16, height 6\' 1"  (1.854 m), weight (!) 136.5 kg (301 lb), SpO2 95 %.Body mass index is 39.71 kg/m.  General Appearance: Casual  Eye Contact::  Fair  Speech:  Clear and Coherent and Normal Rate409  Volume:  Normal  Mood:  Euthymic  Affect:  Appropriate and Congruent  Thought Process:  Coherent and Goal Directed  Orientation:  Full (Time, Place, and Person)  Thought Content:  Logical  Suicidal Thoughts:  No  Homicidal Thoughts:  No  Memory:  Immediate;   Fair Recent;   Fair Remote;   Fair  Judgement:  Fair  Insight:  Fair  Psychomotor Activity:  Normal  Concentration:  Fair  Recall:  FiservFair  Fund of Knowledge:Fair  Language: Fair  Akathisia:  No  Handed:  Right  AIMS (if indicated):     Assets:  Communication Skills Desire for Improvement Financial Resources/Insurance Housing Resilience Social Support Transportation  Sleep:  Number of Hours: 6.5  Cognition: WNL  ADL's:  Intact   Mental Status Per Nursing Assessment::   On Admission:     Demographic Factors:    Loss Factors: NA  Historical Factors: NA  Risk Reduction Factors:   Responsible for children under 52 years of age, Sense of responsibility to family, Religious beliefs about death, Living with another person, especially a relative, Positive social  support and Positive therapeutic relationship  Continued Clinical Symptoms:  None  Cognitive Features That Contribute To Risk:  None    Suicide Risk:  Minimal: No identifiable suicidal ideation.  Patients presenting with no risk factors but with morbid ruminations; may be classified as minimal risk based on the severity of the depressive symptoms  Follow-up Information    [facility] Follow up on 07/30/2016.   Why:  Medications management w Dr Donell BeersPlovsky, next appointment is 10/2 at 11:45 AM.  Bring hospital discharge paperwork to appointment.  Call to cancel/reschedule if needed.  CSW was unable to schedule any sooner w this provider.   Contact information: [address], [place], [place] [address]   Phone # [phone]   Fax # [phone]          [facility] Follow up on 07/16/2016.   Why:  Patient current w therapist Efraim KaufmannMelissa, next appointment is 9/18 at 12:30 PM.  Please call to cancel/reschedule if needed.  Contact information: [address],  [place], [place] [address] Phone: [phone] Fax:  [phone]       [facility] Follow up on 07/16/2016.   Specialty:  Behavioral Health Why:  at 8:45am with Vinita Sinkita for continuation with [facility] Contact information: [address] [id number] mc [address] [id number] [phone]          Plan Of Care/Follow-up recommendations:  Activity:  Patient to start IOP Tomorrow  or Monday and follow up with outpatient psychiatrist 07/30/16 and Therapist on 07/16/16  Wynelle BourgeoisUreh N Lekauwa, MD 07/12/2016,  12:46 PM

## 2016-07-12 NOTE — Progress Notes (Signed)
  [facility] Discharge Plan :  Will you be returning to the same living situation after discharge:  Yes,  Pt returning home At discharge, do you have transportation home?: Yes,  Pt has his car at the hospital Do you have the ability to pay for your medications: Yes,  Pt provided with prescriptions  Release of information consent forms completed and in the chart;  Patient's signature needed at discharge.  Patient to Follow up at: Follow-up Information    [facility] Follow up on 07/30/2016.   Why:  Medications management w Dr Donell BeersPlovsky, next appointment is 10/2 at 11:45 AM.  Bring hospital discharge paperwork to appointment.  Call to cancel/reschedule if needed.  CSW was unable to schedule any sooner w this provider.   Contact information: [address], [place], [place] [address]   Phone # [phone]   Fax # [phone]          [facility] Follow up on 07/16/2016.   Why:  Patient current w therapist Efraim KaufmannMelissa, next appointment is 9/18 at 12:30 PM.  Please call to cancel/reschedule if needed.  Contact information: [address],  [place], [place] [address] Phone: [phone] Fax:  [phone]       BEHAVIORAL HEALTH INTENSIVE PSYCH Follow up on 07/16/2016.   Specialty:  Behavioral Health Why:  at 8:45am with [place] Sinkita for continuation with [facility] Contact information: [address] [id number] [place] [address] [id number] [phone]          Next level of care provider has access to [facility] Link:no  Safety Planning and Suicide Prevention discussed: Yes,  with Pt; declines family contact  Have you used any form of tobacco in the last 30 days? (Cigarettes, Smokeless Tobacco, Cigars, and/or Pipes): No  Has patient been referred to the Quitline?: N/A patient is not a smoker  Patient has been referred for addiction treatment: N/A  Elaina HoopsLauren M Carter 07/12/2016, 10:20 AM

## 2016-07-16 ENCOUNTER — Encounter (HOSPITAL_COMMUNITY): Payer: Self-pay | Admitting: Psychiatry

## 2016-07-16 ENCOUNTER — Other Ambulatory Visit (HOSPITAL_COMMUNITY): Payer: BLUE CROSS/BLUE SHIELD | Attending: Psychiatry | Admitting: Psychiatry

## 2016-07-16 DIAGNOSIS — Z79899 Other long term (current) drug therapy: Secondary | ICD-10-CM | POA: Diagnosis not present

## 2016-07-16 DIAGNOSIS — F411 Generalized anxiety disorder: Secondary | ICD-10-CM

## 2016-07-16 DIAGNOSIS — F332 Major depressive disorder, recurrent severe without psychotic features: Secondary | ICD-10-CM | POA: Insufficient documentation
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
## 2016-07-17 ENCOUNTER — Other Ambulatory Visit (HOSPITAL_COMMUNITY): Payer: BLUE CROSS/BLUE SHIELD | Admitting: Psychiatry

## 2016-07-17 ENCOUNTER — Encounter (HOSPITAL_COMMUNITY): Payer: Self-pay | Admitting: Psychiatry

## 2016-07-17 DIAGNOSIS — F332 Major depressive disorder, recurrent severe without psychotic features: Secondary | ICD-10-CM
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
## 2016-07-18 ENCOUNTER — Encounter (HOSPITAL_COMMUNITY): Payer: Self-pay | Admitting: Licensed Clinical Social Worker

## 2016-07-18 ENCOUNTER — Other Ambulatory Visit (HOSPITAL_COMMUNITY): Payer: BLUE CROSS/BLUE SHIELD | Admitting: Psychiatry

## 2016-07-18 DIAGNOSIS — F332 Major depressive disorder, recurrent severe without psychotic features: Secondary | ICD-10-CM | POA: Diagnosis not present

## 2016-07-18 DIAGNOSIS — F411 Generalized anxiety disorder: Secondary | ICD-10-CM

## 2016-07-18 NOTE — Progress Notes (Signed)
Daily Group Progress Note  Program: IOP  Group Time: 10:45-12:00pm  Participation Level: Active  Behavioral Response: Appropriate  Type of Therapy:  Psychotherapy  Summary of Progress:  Pt participated in chair yoga, facilitated by [facility] therapist and certified yoga instructor Leanne Yates. The activity focused on breath with movement. For patients with anxiety and depression as part of self-care and mindfulness, yoga helps train the relaxation response.    Mckala Pantaleon S. Rhya Shan, LCAS-A   

## 2016-07-18 NOTE — Progress Notes (Signed)
    Daily Group Progress Note  Program: IOP Group Time: 9:00-12:00   Participation Level: minimal    Behavioral Response:  lethargic/flat   Type of Therapy: group therapy   Summary of Progress: Client feels depressed and worried about losing his unemployment. He visited his sister in [place] this past weekend who is ill with a heart condition and this made him feel even more depressed than before. He reported his anxiety began at age 50. Client was in inpatient the week before, and reflected on how he felt his troubles disappeared while upstairs, though he also felt like a prisoner.    Shaune PollackBrown, Jennifer B, LPC

## 2016-07-18 NOTE — Progress Notes (Signed)
    Daily Group Progress Note  Program: IOP Group Time: 9:00-12:00   Participation Level: minimal   Behavioral Response: { lethargic, flat   Type of Therapy:   group therapy   Summary of Progress: Client spoke minimally in session, and initially expressed his doubts about the use of hypnosis guided imagery relaxation techniques, but did participate in the brief hypnosis exercise. Toward the end of the session he related that he understood what the women in the group were talking about by feeling dehumanized by their jobs. This very sense of dehumanization was what led him to lose his job and be fired by [employer], and he now fears that this will happen again if he gets a job. This makes it hard for him to want to find a new job.    Shaune PollackBrown, Khristy Kalan B, LPC

## 2016-07-19 ENCOUNTER — Other Ambulatory Visit (HOSPITAL_COMMUNITY): Payer: BLUE CROSS/BLUE SHIELD | Admitting: Psychiatry

## 2016-07-19 DIAGNOSIS — F332 Major depressive disorder, recurrent severe without psychotic features: Secondary | ICD-10-CM | POA: Diagnosis not present

## 2016-07-19 NOTE — Progress Notes (Signed)
    Daily Group Progress Note  Program: IOP  Group Time: 9:00-12:00  Participation Level: Active  Behavioral Response: Appropriate  Type of Therapy:  Group Therapy  Summary of Progress: Pt. Continues to present as quiet, depressed, flat affect, responsive to questions from therapist. Pt. Reported that he did not like yoga yesterday and that the slow pace of the posture seemed to heighten his anxiety. Pt. Discussed absence of self-care behaviors other than laying on the couch and stated a general preference for not being alone. Participated in discussion about the benefits of yoga in light of yesterday's yoga session in group and the importance of developing self-care behaviors.    Shaune PollackBrown, Anais Koenen B, LPC

## 2016-07-20 ENCOUNTER — Ambulatory Visit
Payer: BLUE CROSS/BLUE SHIELD | Attending: Diagnostic Neuroimaging | Admitting: Rehabilitative and Restorative Service Providers"

## 2016-07-20 ENCOUNTER — Ambulatory Visit: Payer: BLUE CROSS/BLUE SHIELD | Admitting: Speech Pathology

## 2016-07-20 DIAGNOSIS — R2689 Other abnormalities of gait and mobility: Secondary | ICD-10-CM

## 2016-07-20 NOTE — Progress Notes (Signed)
    Daily Group Progress Note  Program: IOP  Group Time: 9:00-12:00   Participation Level:  minimal   Behavioral Response:  lethargic, flat   Type of Therapy:  group therapy   Summary of Progress: Client spoke minimally in session, but opened up toward the end. Initially he reported sleeping poorly, and being afraid to look for work, but also fearing his lack of income. After numerous other group members spoke he shared is fears and doubts around working once again. This time counselor challenged the clients thought distortions and asked the client to reflect upon his previous job experiences when he'd succeeded. Client reported multiple jobs where he'd flourished, but continued to focus on the negative of his most recent job loss at [employer]. Counselor encouraged client to challenge his distorted cognition (selective abstraction) and to focus on his knowledge of himself and his capacity for succeeding in the work place based on his past successes. Client seemed receptive.    Shaune PollackBrown, Taeshawn Helfman B, LPC

## 2016-07-20 NOTE — Therapy (Signed)
[facility] [address] [place], [place], [address] Phone: [phone]   Fax:  [phone]  Patient Details  Name: Reyne DumasWarren Adamos Kohn MRN: [id number] Date of Birth: 11/23/1965 Referring Provider:  Suanne MarkerPenumalli, Vikram R, MD  Encounter Date: 07/20/2016   *Eval not completed today      Subjective Assessment - 07/20/16 1115    Subjective The patient reports that he feels all issues are resolved and he had medications adjusted and the medication was causing his issues.  He has returned to baseline and drove himself to therapy.  He is walking regularly for exercise.  He expresses that he does not think he needs this visit.         Plan - 07/20/16 1125    Clinical Impression Statement No further f/u requested at this time per patient's subjective request.      Thank you for the referral of this patient. Margretta DittyChristina Lorne Winkels, MPT  Avriana Joo 07/20/2016, 11:25 AM  [facility] [address] [place], [place], [address] Phone: [phone]   Fax:  [phone]

## 2016-07-23 ENCOUNTER — Other Ambulatory Visit (HOSPITAL_COMMUNITY): Payer: BLUE CROSS/BLUE SHIELD | Admitting: Psychiatry

## 2016-07-23 DIAGNOSIS — F332 Major depressive disorder, recurrent severe without psychotic features: Secondary | ICD-10-CM

## 2016-07-24 ENCOUNTER — Other Ambulatory Visit (HOSPITAL_COMMUNITY): Payer: BLUE CROSS/BLUE SHIELD

## 2016-07-25 ENCOUNTER — Other Ambulatory Visit (HOSPITAL_COMMUNITY): Payer: BLUE CROSS/BLUE SHIELD | Admitting: Psychiatry

## 2016-07-25 DIAGNOSIS — F332 Major depressive disorder, recurrent severe without psychotic features: Secondary | ICD-10-CM | POA: Diagnosis not present

## 2016-07-25 NOTE — Progress Notes (Signed)
    Daily Group Progress Note  Program: IOP  Group Time: 9:00-12:00   Participation Level:  minimal   Behavioral Response: lethargic, flat   Type of Therapy:  group therapy   Summary of Progress: Client feels stuck in his depression, and reports his wife has been putting pressure on him to "fight through" his struggles, which in turn makes him feel worse. Other group members discussed setting small goals for themselves, and being happy with small gains. Client didn't seem very receptive to this idea and expressed that small things are "not enough". Counselor challenged this negative cognition, but client continued to express despair. However, he did express that he felt he could try to change his behaviors slightly by attempting to engage his daughter in conversation-he fears she does not understand his depression, and feels there is a disconnect between them at present. Later in session client drew his depression as a dark blue cloud that engulfs him.   Shaune PollackBrown, Yanelie Abraha B, LPC

## 2016-07-26 ENCOUNTER — Other Ambulatory Visit (HOSPITAL_COMMUNITY): Payer: BLUE CROSS/BLUE SHIELD | Admitting: Psychiatry

## 2016-07-26 DIAGNOSIS — F332 Major depressive disorder, recurrent severe without psychotic features: Secondary | ICD-10-CM | POA: Diagnosis not present

## 2016-07-27 ENCOUNTER — Other Ambulatory Visit (HOSPITAL_COMMUNITY): Payer: BLUE CROSS/BLUE SHIELD | Admitting: Psychiatry

## 2016-07-27 DIAGNOSIS — F332 Major depressive disorder, recurrent severe without psychotic features: Secondary | ICD-10-CM

## 2016-07-27 NOTE — Progress Notes (Signed)
    Daily Group Progress Note  Program: IOP  Group Time: 9:00-12:00   Participation Level:  minimal   Behavioral Response: lethargic, flat   Type of Therapy:  group therapy   Summary of Progress: Client continues to feel stuck in his depression, and related to the concept of wearing a mask. However he noted that he has been working on his relationship with his daughter and has applied to some jobs.      Shaune PollackBrown, Jennifer B, LPC

## 2016-07-30 ENCOUNTER — Other Ambulatory Visit (HOSPITAL_COMMUNITY): Payer: BLUE CROSS/BLUE SHIELD | Admitting: Psychiatry

## 2016-07-30 NOTE — Progress Notes (Signed)
    Daily Group Progress Note  Program: IOP  Group Time: 9:00-12:00  Participation Level: Active  Behavioral Response: Appropriate  Type of Therapy:  Group Therapy  Summary of Progress: Pt. Continues to present as depressed, predominately flat affect but responsive to Counselor's questions and discussion prompts. Pt. Participated in discussion about  theme of developing trust for self and helped to identify importance of 1- taking risks, 2- being honest with oneself, 3-making some mistakes, 4- not being afraid to fail, 5- and having the courage to live your life for yourself on your own terms.      Shaune PollackBrown, Ariannie Penaloza B, LPC

## 2016-07-31 ENCOUNTER — Other Ambulatory Visit (HOSPITAL_COMMUNITY): Payer: BLUE CROSS/BLUE SHIELD
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
## 2016-08-01 ENCOUNTER — Other Ambulatory Visit (HOSPITAL_COMMUNITY): Payer: BLUE CROSS/BLUE SHIELD | Attending: Psychiatry | Admitting: Psychiatry

## 2016-08-01 DIAGNOSIS — F332 Major depressive disorder, recurrent severe without psychotic features: Secondary | ICD-10-CM | POA: Insufficient documentation

## 2016-08-02 ENCOUNTER — Other Ambulatory Visit (HOSPITAL_COMMUNITY): Payer: BLUE CROSS/BLUE SHIELD

## 2016-08-03 ENCOUNTER — Other Ambulatory Visit (HOSPITAL_COMMUNITY): Payer: BLUE CROSS/BLUE SHIELD | Admitting: Psychiatry

## 2016-08-03 ENCOUNTER — Telehealth (HOSPITAL_COMMUNITY): Payer: Self-pay | Admitting: Psychiatry

## 2016-08-03 NOTE — Progress Notes (Signed)
    Daily Group Progress Note  Program: IOP  Group Time: 9:00-12:00   Participation Level: minimal   Behavioral Response: flat   Type of Therapy:  group therapy   Summary of Progress:  Client was largely silent in session, but shared that he's feeling a little bit better since he's begun taking Wellbutrin. He reported having a productive night, but finding it hard to get himself to group in the morning, though he was glad he made it.    Shaune Pollack, LPC

## 2016-08-06 ENCOUNTER — Other Ambulatory Visit (HOSPITAL_COMMUNITY): Payer: BLUE CROSS/BLUE SHIELD

## 2016-08-06 NOTE — Progress Notes (Signed)
Luis Russell is a 50 yr old, married, unemployed, Caucasian male; who [facility].  Pt was admitted inpt from 07-09-16 thru 07-12-16 due to severe depressive symptoms with SI (plan was to cut wrists/OD).  Reported no longer having any SI upon admission and currently.  Pt's affect still remains somewhat brighter.  According to pt, his depressive and anxiety symptoms started to worsen in August 2017; once his son went to college, daugther went back to school, along with wife returning to her teaching position.  "I was doing Ok during the summer whenever everyone was at home."  According to pt, he was able to structure his days and not isolate himself.  Pt reported he was back isolating himself and staying in bed all day.  Triggers/Stressors:  1)  Unemployment:  Pt has been out on disability since January 2016 d/t his depression and anxiety.  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  He mentioned he was there for six months, but didn't have the appropriate training for the position.  Pt states his disability will run out in February 2018 and is worried that he may not find another job before then or yet be ready to re-enter into the workforce.  Subsequently, pt is worried about finances.  "My wife is a Engineer, siteschool teacher and I feel like I need to contribute to the finances."  No prior psychiatric hospitalizations, besides [facility].  Pt has seen Dr. Donell BeersPlovsky for a couple of months; [facility] Sett[facility] Mekita, [facility] for two [facility] (every two weeks).  Denies any prior suicide attempts or gestures.  PCP:  Dr. Johny BlamerWilliam Maready [facility]  Family Hx:  Deceased Father (ETOH).                                                                        Pt attended a total of nine days out of the twelve.  Pt is requesting discharge today because his sister had heart surgery last week and [place] been [place] [facility] [place] [place].  Pt was opening up somewhat  in the groups. Denies SI/HI or A/V hallucinations.   A:  D/C today.  F/U with Dr. Donell BeersPlovsky and [facility].  Referral to Vocational Rehab.  Encouraged support groups.  R:  Pt receptive.     Chestine SporeLARK, RITA, M.Ed, CNA

## 2016-08-06 NOTE — Patient Instructions (Signed)
D:  Pt requesting discharge today.  A:  Follow up with Dr. Donell BeersPlovsky and Weston SettleMelissa Mekita, LPC.  Encouraged support groups.  Provided pt with Vocational Rehab information.  R:  Pt receptive.

## 2016-08-07 ENCOUNTER — Other Ambulatory Visit (HOSPITAL_COMMUNITY): Payer: BLUE CROSS/BLUE SHIELD

## 2016-08-07 NOTE — Progress Notes (Signed)
Patient ID: Luis Russell, male   DOB: 02/03/1966, 50 y.o.   MRN: [id number] IOP Discharge note Date of admission:  07/16/2016 Date of discharge: 08/07/2016  IOP Course:  Mr Luis Russell attended and participated by his presence.  Not a big talker or sharer but he indicated the group was beneficial to him.  No suicidal thoughts through out his stay.  His sister was hospitalized and he wanted to be discharged to be with her as he was feeling better.  Mental status at the last visit before his discharge indicated he was feeling much less depressed and was not suicidal  Final diagnosis:  Severe major depression, recurrent without psychotic features  He has appointments with a psychiatrist and therapist after discharge

## 2016-08-08 ENCOUNTER — Ambulatory Visit: Payer: BLUE CROSS/BLUE SHIELD | Admitting: Diagnostic Neuroimaging

## 2016-08-08 ENCOUNTER — Other Ambulatory Visit (HOSPITAL_COMMUNITY): Payer: BLUE CROSS/BLUE SHIELD

## 2016-08-09 ENCOUNTER — Other Ambulatory Visit (HOSPITAL_COMMUNITY): Payer: BLUE CROSS/BLUE SHIELD

## 2016-08-10 ENCOUNTER — Other Ambulatory Visit (HOSPITAL_COMMUNITY): Payer: BLUE CROSS/BLUE SHIELD

## 2016-08-13 ENCOUNTER — Other Ambulatory Visit (HOSPITAL_COMMUNITY): Payer: BLUE CROSS/BLUE SHIELD

## 2016-08-14 ENCOUNTER — Other Ambulatory Visit (HOSPITAL_COMMUNITY): Payer: BLUE CROSS/BLUE SHIELD

## 2016-08-15 ENCOUNTER — Other Ambulatory Visit (HOSPITAL_COMMUNITY): Payer: BLUE CROSS/BLUE SHIELD

## 2016-08-16 ENCOUNTER — Other Ambulatory Visit (HOSPITAL_COMMUNITY): Payer: BLUE CROSS/BLUE SHIELD

## 2016-08-17 ENCOUNTER — Other Ambulatory Visit (HOSPITAL_COMMUNITY): Payer: BLUE CROSS/BLUE SHIELD

## 2016-08-20 ENCOUNTER — Other Ambulatory Visit (HOSPITAL_COMMUNITY): Payer: BLUE CROSS/BLUE SHIELD

## 2016-08-21 ENCOUNTER — Other Ambulatory Visit (HOSPITAL_COMMUNITY): Payer: BLUE CROSS/BLUE SHIELD

## 2016-08-22 ENCOUNTER — Other Ambulatory Visit (HOSPITAL_COMMUNITY): Payer: BLUE CROSS/BLUE SHIELD

## 2016-08-23 ENCOUNTER — Other Ambulatory Visit (HOSPITAL_COMMUNITY): Payer: BLUE CROSS/BLUE SHIELD

## 2016-08-24 ENCOUNTER — Other Ambulatory Visit (HOSPITAL_COMMUNITY): Payer: BLUE CROSS/BLUE SHIELD

## 2016-08-27 ENCOUNTER — Other Ambulatory Visit (HOSPITAL_COMMUNITY): Payer: BLUE CROSS/BLUE SHIELD

## 2016-08-28 ENCOUNTER — Other Ambulatory Visit (HOSPITAL_COMMUNITY): Payer: BLUE CROSS/BLUE SHIELD

## 2016-08-29 ENCOUNTER — Other Ambulatory Visit (HOSPITAL_COMMUNITY): Payer: BLUE CROSS/BLUE SHIELD

## 2016-08-30 ENCOUNTER — Other Ambulatory Visit (HOSPITAL_COMMUNITY): Payer: BLUE CROSS/BLUE SHIELD

## 2016-08-31 ENCOUNTER — Other Ambulatory Visit (HOSPITAL_COMMUNITY): Payer: BLUE CROSS/BLUE SHIELD

## 2016-09-03 ENCOUNTER — Other Ambulatory Visit (HOSPITAL_COMMUNITY): Payer: BLUE CROSS/BLUE SHIELD

## 2016-09-04 ENCOUNTER — Other Ambulatory Visit (HOSPITAL_COMMUNITY): Payer: BLUE CROSS/BLUE SHIELD

## 2016-09-05 ENCOUNTER — Other Ambulatory Visit (HOSPITAL_COMMUNITY): Payer: BLUE CROSS/BLUE SHIELD

## 2016-09-06 ENCOUNTER — Other Ambulatory Visit (HOSPITAL_COMMUNITY): Payer: BLUE CROSS/BLUE SHIELD

## 2016-09-07 ENCOUNTER — Other Ambulatory Visit (HOSPITAL_COMMUNITY): Payer: BLUE CROSS/BLUE SHIELD

## 2017-04-23 ENCOUNTER — Other Ambulatory Visit: Payer: Self-pay | Admitting: Family Medicine

## 2017-04-23 ENCOUNTER — Ambulatory Visit
Admission: RE | Admit: 2017-04-23 | Discharge: 2017-04-23 | Disposition: A | Discharge status: Home / Self Care | Payer: BC Managed Care – PPO | Source: Ambulatory Visit | Attending: Family Medicine | Admitting: Family Medicine

## 2017-04-23 DIAGNOSIS — R0981 Nasal congestion: Secondary | ICD-10-CM

## 2017-04-23 DIAGNOSIS — R519 Headache, unspecified: Secondary | ICD-10-CM

## 2017-04-23 DIAGNOSIS — R51 Headache: Principal | ICD-10-CM

## 2017-04-26 ENCOUNTER — Other Ambulatory Visit: Payer: Self-pay | Admitting: Family Medicine

## 2017-04-26 DIAGNOSIS — R51 Headache: Principal | ICD-10-CM

## 2017-04-26 DIAGNOSIS — R519 Headache, unspecified: Secondary | ICD-10-CM

## 2017-06-03 DIAGNOSIS — R519 Headache, unspecified: Secondary | ICD-10-CM | POA: Insufficient documentation

## 2017-06-03 DIAGNOSIS — H9190 Unspecified hearing loss, unspecified ear: Secondary | ICD-10-CM | POA: Insufficient documentation

## 2017-12-17 IMAGING — CT CT MAXILLOFACIAL W/O CM
2 of 3 series · 15 of 37 positions shown, 18 images · non-contrast
Comparison: MRI of the brain 06/03/2016.

CLINICAL DATA: Sinus pain and pressure.  Headaches.

EXAM:
CT MAXILLOFACIAL WITHOUT CONTRAST
TECHNIQUE: Multidetector CT imaging of the maxillofacial structures was
performed. Multiplanar CT image reconstructions were also generated.
A small metallic BB was placed on the right temple in order to
reliably differentiate right from left.

[Series 601: coronal facial · coronal · 0.41mm/px · 3 of 95 slices shown]
[im 32/95  bone]
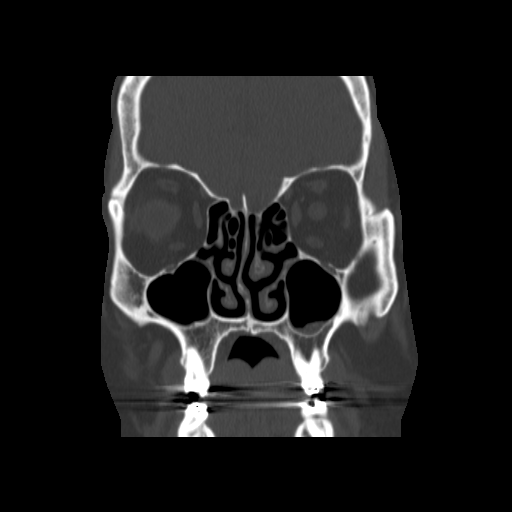
[im 48/95  bone]
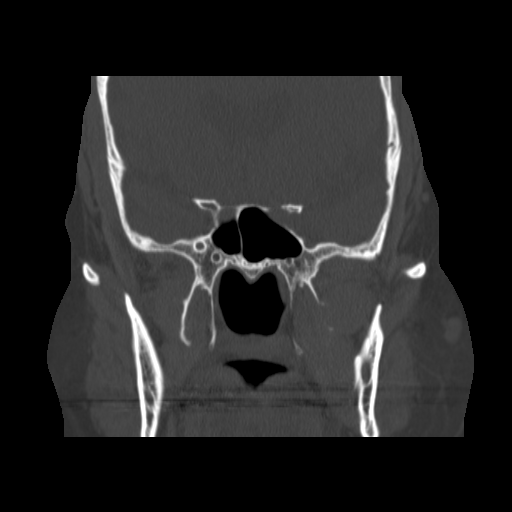
[im 63/95  bone]
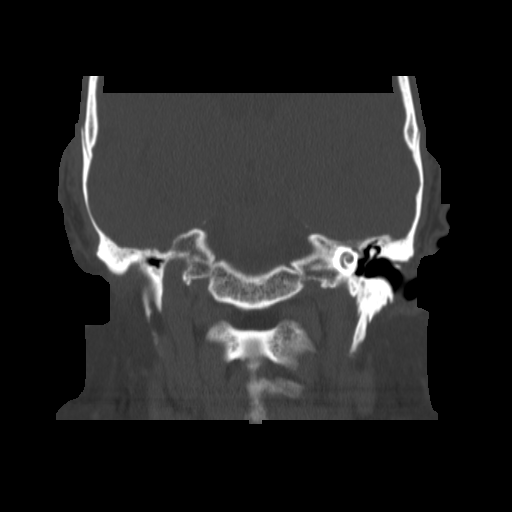

[Series 602: sagittal facial · sagittal · 0.41mm/px · 12 of 102 slices shown, 15 images]
[im 6/102  brain]
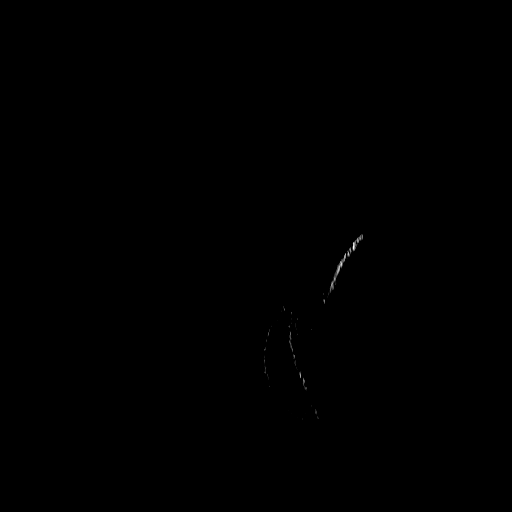
[im 6/102  bone]
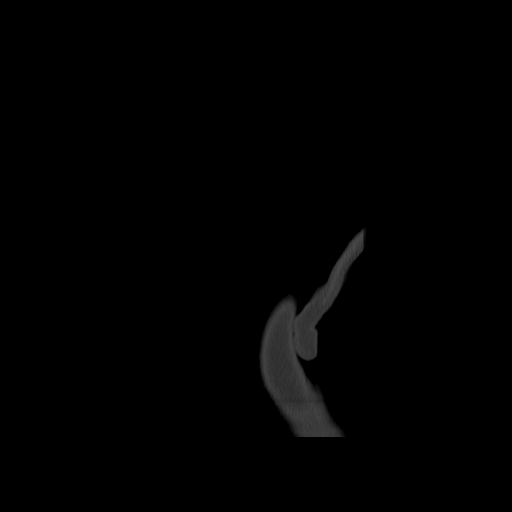
[im 17/102  bone]
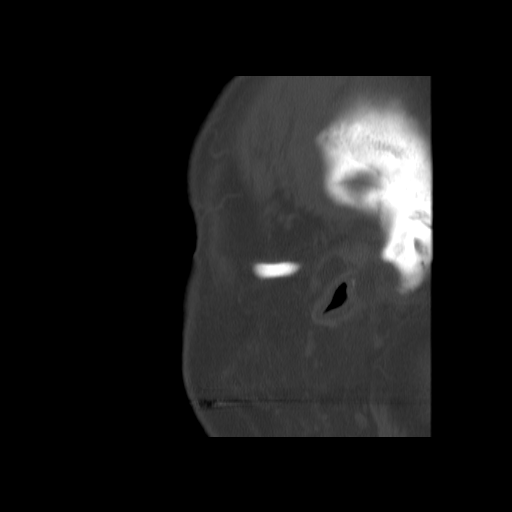
[im 23/102  bone]
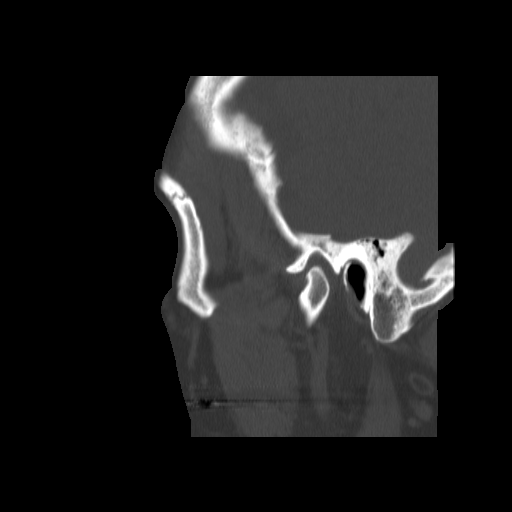
[im 29/102  bone]
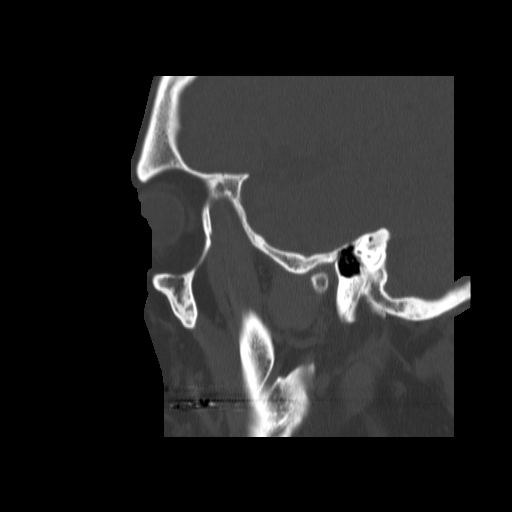
[im 40/102  brain]
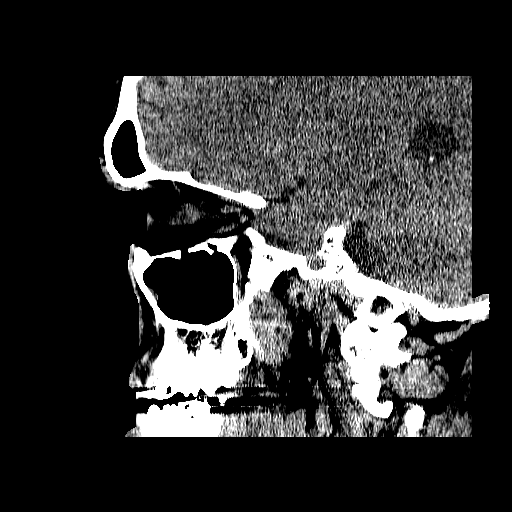
[im 40/102  bone]
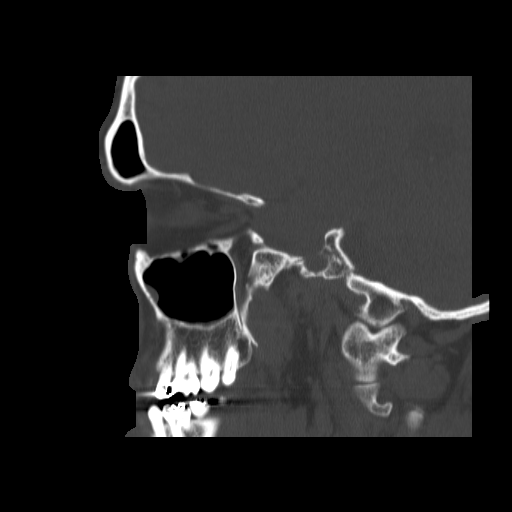
[im 45/102  bone]
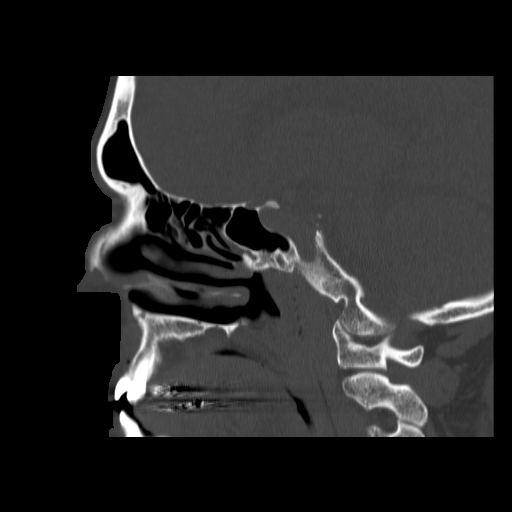
[im 57/102  bone]
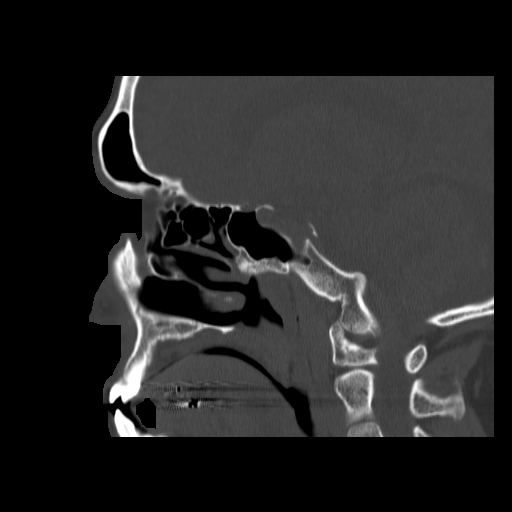
[im 62/102  bone]
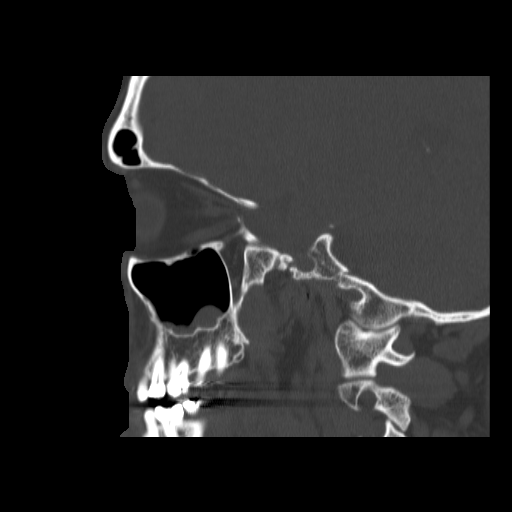
[im 73/102  brain]
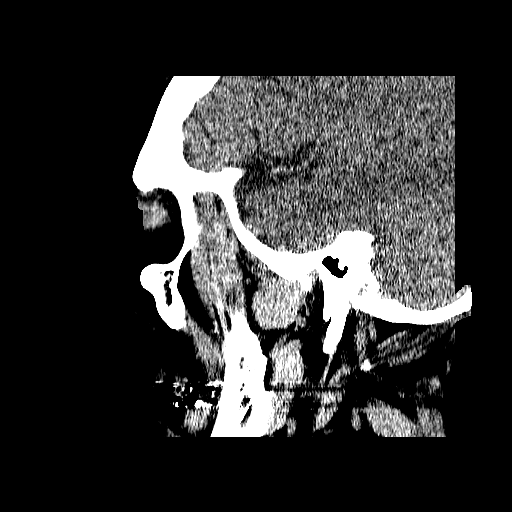
[im 73/102  bone]
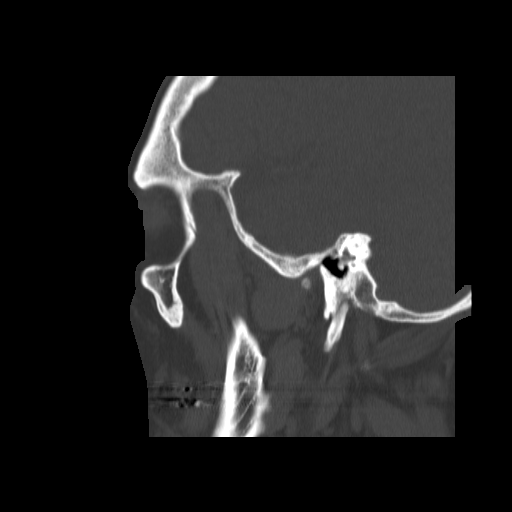
[im 79/102  bone]
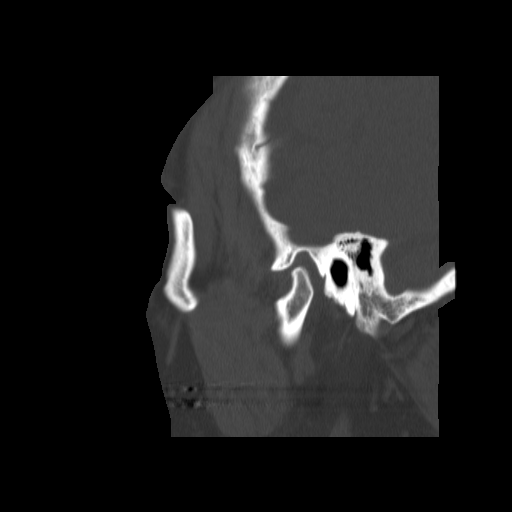
[im 85/102  bone]
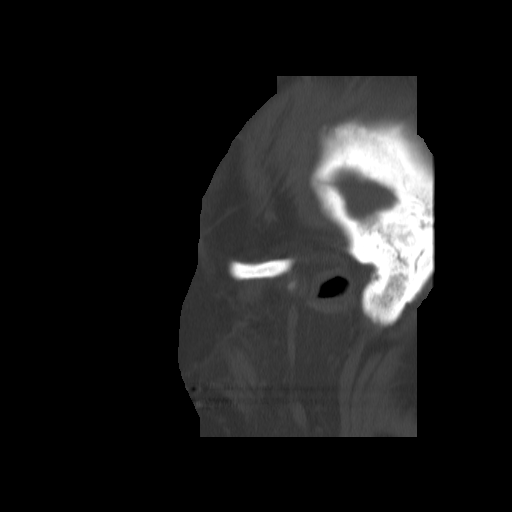
[im 96/102  bone]
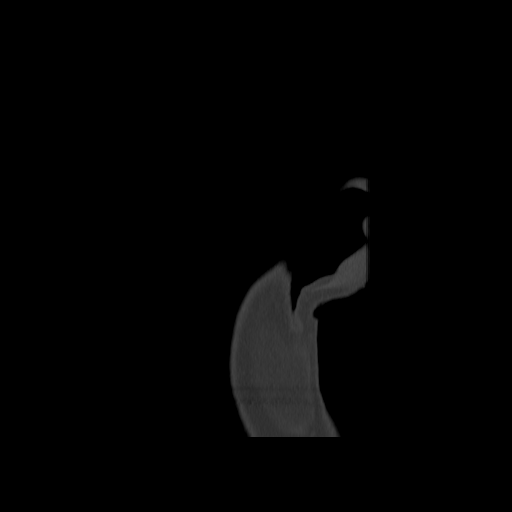

[15 of 37 positions shown; findings below may reference images not displayed]

FINDINGS: Osseous: No fracture or mandibular dislocation. No destructive
process.

Orbits: Negative. No traumatic or inflammatory finding.

Sinuses: Rounded soft tissue in the floor of the left maxillary
sinus, likely mucosal thickening or mucous retention cyst. This is
stable since prior MRI. Paranasal sinuses otherwise clear. No
air-fluid levels. Mastoid air cells are clear.

Soft tissues: Negative.

Limited intracranial: No significant or unexpected finding.
IMPRESSION: Minimal mucosal thickening or small mucous retention cyst in the
floor the left maxillary sinus, stable since prior MRI. No acute
findings.

## 2018-12-09 ENCOUNTER — Other Ambulatory Visit (HOSPITAL_COMMUNITY): Payer: Self-pay | Admitting: Psychiatry

## 2018-12-15 ENCOUNTER — Other Ambulatory Visit (HOSPITAL_COMMUNITY): Payer: Self-pay | Admitting: Psychiatry

## 2019-02-21 ENCOUNTER — Other Ambulatory Visit (HOSPITAL_COMMUNITY): Payer: Self-pay | Admitting: Psychiatry

## 2019-02-24 ENCOUNTER — Other Ambulatory Visit (HOSPITAL_COMMUNITY): Payer: Self-pay | Admitting: Psychiatry

## 2019-03-09 ENCOUNTER — Other Ambulatory Visit (HOSPITAL_COMMUNITY): Payer: Self-pay | Admitting: Psychiatry

## 2019-03-26 ENCOUNTER — Other Ambulatory Visit (HOSPITAL_COMMUNITY): Payer: Self-pay | Admitting: Psychiatry

## 2019-12-27 ENCOUNTER — Ambulatory Visit: Payer: BC Managed Care – PPO | Attending: Internal Medicine

## 2019-12-27 DIAGNOSIS — Z23 Encounter for immunization: Secondary | ICD-10-CM | POA: Insufficient documentation

## 2019-12-27 NOTE — Progress Notes (Signed)
   Covid-19 Vaccination Clinic  Name:  Luis Russell    MRN: [id number] DOB: [birth year]  12/27/2019  [name] was observed post Covid-19 immunization for 15 minutes without incidence. He was provided with Vaccine Information Sheet and instruction to access the V-Safe system.   [name] was instructed to call 911 with any severe reactions post vaccine: Marland Kitchen Difficulty breathing  . Swelling of your face and throat  . A fast heartbeat  . A bad rash all over your body  . Dizziness and weakness    Immunizations Administered    Name Date Dose VIS Date Route   Pfizer COVID-19 Vaccine 12/27/2019 12:33 PM 0.3 mL 10/09/2019 Intramuscular   Manufacturer: [employer], [employer]   Lot: [id number]   NDC: [address]-2

## 2020-01-19 ENCOUNTER — Ambulatory Visit: Payer: BC Managed Care – PPO | Attending: Internal Medicine

## 2020-01-19 DIAGNOSIS — Z23 Encounter for immunization: Secondary | ICD-10-CM

## 2020-01-19 NOTE — Progress Notes (Signed)
   Covid-19 Vaccination Clinic  Name:  Luis Russell    MRN: [id number] DOB: [birth year]  01/19/2020  [name] was observed post Covid-19 immunization for 15 minutes without incident. He was provided with Vaccine Information Sheet and instruction to access the V-Safe system.   [name] was instructed to call 911 with any severe reactions post vaccine: Marland Kitchen Difficulty breathing  . Swelling of face and throat  . A fast heartbeat  . A bad rash all over body  . Dizziness and weakness   Immunizations Administered    Name Date Dose VIS Date Route   Pfizer COVID-19 Vaccine 01/19/2020  2:34 PM 0.3 mL 10/09/2019 Intramuscular   Manufacturer: [employer], [employer]   Lot: [id number]   NDC: [address]-0

## 2020-03-24 ENCOUNTER — Other Ambulatory Visit: Payer: Self-pay

## 2020-03-24 ENCOUNTER — Encounter (INDEPENDENT_AMBULATORY_CARE_PROVIDER_SITE_OTHER): Payer: Self-pay | Admitting: Family Medicine

## 2020-03-24 ENCOUNTER — Ambulatory Visit (INDEPENDENT_AMBULATORY_CARE_PROVIDER_SITE_OTHER): Payer: BLUE CROSS/BLUE SHIELD | Admitting: Family Medicine

## 2020-03-24 VITALS — BP 108/77 | HR 95 | Temp 99.0°F | Ht 74.0 in | Wt 342.0 lb

## 2020-03-24 DIAGNOSIS — F3289 Other specified depressive episodes: Secondary | ICD-10-CM | POA: Diagnosis not present

## 2020-03-24 DIAGNOSIS — Z9189 Other specified personal risk factors, not elsewhere classified: Secondary | ICD-10-CM

## 2020-03-24 DIAGNOSIS — R5383 Other fatigue: Secondary | ICD-10-CM | POA: Insufficient documentation

## 2020-03-24 DIAGNOSIS — E7849 Other hyperlipidemia: Secondary | ICD-10-CM | POA: Diagnosis not present

## 2020-03-24 DIAGNOSIS — R7303 Prediabetes: Secondary | ICD-10-CM | POA: Insufficient documentation

## 2020-03-24 DIAGNOSIS — R0602 Shortness of breath: Secondary | ICD-10-CM | POA: Diagnosis not present

## 2020-03-24 DIAGNOSIS — Z0289 Encounter for other administrative examinations: Secondary | ICD-10-CM

## 2020-03-24 DIAGNOSIS — Z6841 Body Mass Index (BMI) 40.0 and over, adult: Secondary | ICD-10-CM
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
## 2020-03-24 NOTE — Progress Notes (Signed)
Chief Complaint:   OBESITY Luis Russell (MR# [id number]) is a 54 y.o. male who presents for evaluation and treatment of obesity and related comorbidities. Current BMI is Body mass index is 43.91 kg/m. Luis Russell has been struggling with his weight for many years and has been unsuccessful in either losing weight, maintaining weight loss, or reaching his healthy weight goal.  Luis Russell is currently in the action stage of change and ready to dedicate time achieving and maintaining a healthier weight. Luis Russell is interested in becoming our patient and working on intensive lifestyle modifications including (but not limited to) diet and exercise for weight loss.  Luis Russell's habits were reviewed today and are as follows: His family eats meals together, he thinks his family will eat healthier with him, his desired weight loss is 82 lbs, he started gaining weight in 2003, his heaviest weight ever was 330 pounds, he is a picky eater and doesn't like to eat healthier foods, he has significant food cravings issues, he snacks frequently in the evenings, he is frequently drinking liquids with calories, he frequently makes poor food choices, he frequently eats larger portions than normal and he struggles with emotional eating.  Depression Screen Luis Russell's Food and Mood (modified PHQ-9) score was 20.  Depression screen Luis Russell 2/9 03/24/2020  Decreased Interest 3  Down, Depressed, Hopeless 3  PHQ - 2 Score 6  Altered sleeping 3  Tired, decreased energy 3  Change in appetite 3  Feeling bad or failure about yourself  3  Trouble concentrating 1  Moving slowly or fidgety/restless 0  Suicidal thoughts 1  PHQ-9 Score 20  Difficult doing work/chores Not difficult at all   Subjective:   1. Other fatigue Luis Russell admits to daytime somnolence and admits to waking up still tired. Patent has a history of symptoms of daytime fatigue and morning headache. Luis Russell generally gets 5 hours of sleep per night, and states  that he has difficulty falling asleep. Snoring is present. Apneic episodes are present. Epworth Sleepiness Score is 6.  2. Shortness of breath on exertion Luis Russell notes increasing shortness of breath with exercising and seems to be worsening over time with weight gain. He notes getting out of breath sooner with activity than he used to. This has not gotten worse recently. Luis Russell denies shortness of breath at rest or orthopnea.  3. Other hyperlipidemia Luis Russell is not on statin therapy.  4. Pre-diabetes Luis Russell has a history of elevated A1c readings in Epic. He is not on metformin. His BGs in Epic were 146 on 07/09/2016.  5. Other depression Luis Russell has a positive PHQ-9 score of 20. He is on Trintellix 10 mg q daily.  6. At risk for heart disease Luis Russell is at a higher than average risk for cardiovascular disease due to obesity, hyperlipidemia, and pre-diabetes.   Assessment/Plan:   1. Other fatigue Luis Russell does feel that his weight is causing his energy to be lower than it should be. Fatigue may be related to obesity, depression or many other causes. Labs will be ordered, and in the meanwhile, Luis Russell will focus on self care including making healthy food choices, increasing physical activity and focusing on stress reduction.  - EKG 12-Lead - CBC with Differential/Platelet - Comprehensive metabolic panel - Lipid Panel With LDL/HDL Ratio - VITAMIN D 25 Hydroxy (Vit-D Deficiency, Fractures) - Vitamin B12 - Folate - T3 - T4, free - TSH  2. Shortness of breath on exertion Luis Russell does feel that he gets out of breath more  easily that he used to when he exercises. Luis Russell's shortness of breath appears to be obesity related and exercise induced. He has agreed to work on weight loss and gradually increase exercise to treat his exercise induced shortness of breath. Will continue to monitor closely.  3. Other hyperlipidemia Cardiovascular risk and specific lipid/LDL goals reviewed. We discussed  several lifestyle modifications today. Luis Russell will start his Category 4 meal plan, and will continue to work on exercise and weight loss efforts. We will check labs today. Orders and follow up as documented in patient record.   - CBC with Differential/Platelet - Comprehensive metabolic panel - Lipid Panel With LDL/HDL Ratio  4. Pre-diabetes Luis Russell will start his Category 4 meal plan, and will continue to work on weight loss, exercise, and decreasing simple carbohydrates to help decrease the risk of diabetes. We will check labs today.  - CBC with Differential/Platelet - Comprehensive metabolic panel - Hemoglobin A1c - Insulin, random - Lipid Panel With LDL/HDL Ratio  5. Other depression Behavior modification techniques were discussed today to help Luis Russell deal with his emotional/non-hunger eating behaviors. We will refer to Luis Russell, our Bariatric Psychologist for evaluation. Orders and follow up as documented in patient record.   6. At risk for heart disease Luis Russell was given approximately 30 minutes of  disease prevention counseling today. He is 54 y.o. male and has risk factors for heart disease including obesity, hyperlipidemia, and pre-diabetes. We discussed intensive lifestyle modifications today with an emphasis on specific weight loss instructions and strategies.   Repetitive spaced learning was employed today to elicit superior memory formation and behavioral change.  7. Class 3 severe obesity with serious comorbidity and body mass index (BMI) of 40.0 to 44.9 in adult, unspecified obesity type (HCC) Luis Russell is currently in the action stage of change and his goal is to continue with weight loss efforts. I recommend Luis Russell begin the structured treatment plan as follows:  He has agreed to the Category 4 Plan.  Exercise goals: As is.   Behavioral modification strategies: increasing lean protein intake, decreasing simple carbohydrates, decreasing eating out, no skipping meals, ways to  avoid night time snacking, better snacking choices and celebration eating strategies.  He was informed of the importance of frequent follow-up visits to maximize his success with intensive lifestyle modifications for his multiple health conditions. He was informed we would discuss his lab results at his next visit unless there is a critical issue that needs to be addressed sooner. Luis Russell agreed to keep his next visit at the agreed upon time to discuss these results.  Objective:   Blood pressure 108/77, pulse 95, temperature 99 F (37.2 C), temperature source Oral, height 6\' 2"  (1.88 m), weight (!) 342 lb (155.1 kg), SpO2 94 %. Body mass index is 43.91 kg/m.  EKG: Normal sinus rhythm, rate 95 BPM.  Indirect Calorimeter completed today shows a VO2 of 351 and a REE of 2440.  His calculated basal metabolic rate is thus his basal metabolic rate is worse than expected.  General: Cooperative, alert, well developed, in no acute distress. HEENT: Conjunctivae and lids unremarkable. Cardiovascular: Regular rhythm.  Lungs: Normal work of breathing. Neurologic: No focal deficits.   Lab Results  Component Value Date   CREATININE 0.90 07/09/2016   BUN 14 07/09/2016   NA 138 07/09/2016   K 3.9 07/09/2016   CL 103 07/09/2016   CO2 28 07/09/2016   Lab Results  Component Value Date   ALT 39 07/09/2016   AST  28 07/09/2016   ALKPHOS 102 07/09/2016   BILITOT 1.0 07/09/2016   Lab Results  Component Value Date   HGBA1C 5.8 (H) 06/08/2016   No results found for: INSULIN Lab Results  Component Value Date   TSH 3.048 07/09/2016   No results found for: CHOL, HDL, LDLCALC, LDLDIRECT, TRIG, CHOLHDL Lab Results  Component Value Date   WBC 8.9 07/09/2016   HGB 13.2 07/09/2016   HCT 41.1 07/09/2016   MCV 91.1 07/09/2016   PLT 239 07/09/2016   No results found for: IRON, TIBC, FERRITIN  Attestation Statements:   Reviewed by clinician on day of visit: allergies, medications, problem list,  medical history, surgical history, family history, social history, and previous encounter notes.   I, Burt Knack, am acting as transcriptionist for [name], MD.  I have reviewed the above documentation for accuracy and completeness, and I agree with the above. - [name], MD

## 2020-03-25 LAB — COMPREHENSIVE METABOLIC PANEL
ALT: 55 IU/L — ABNORMAL HIGH (ref 0–44)
AST: 36 IU/L (ref 0–40)
Albumin/Globulin Ratio: 1.5 (ref 1.2–2.2)
Albumin: 4.2 g/dL (ref 3.8–4.9)
Alkaline Phosphatase: 94 IU/L (ref 48–121)
BUN/Creatinine Ratio: 19 (ref 9–20)
BUN: 19 mg/dL (ref 6–24)
Bilirubin Total: 0.6 mg/dL (ref 0.0–1.2)
CO2: 26 mmol/L (ref 20–29)
Calcium: 9.1 mg/dL (ref 8.7–10.2)
Chloride: 101 mmol/L (ref 96–106)
Creatinine, Ser: 1.01 mg/dL (ref 0.76–1.27)
GFR calc Af Amer: 96 mL/min/{1.73_m2} (ref >59)
GFR calc non Af Amer: 83 mL/min/{1.73_m2} (ref >59)
Globulin, Total: 2.8 g/dL (ref 1.5–4.5)
Glucose: 132 mg/dL — ABNORMAL HIGH (ref 65–99)
Potassium: 4.6 mmol/L (ref 3.5–5.2)
Sodium: 138 mmol/L (ref 134–144)
Total Protein: 7 g/dL (ref 6.0–8.5)

## 2020-03-25 LAB — LIPID PANEL WITH LDL/HDL RATIO
Cholesterol, Total: 226 mg/dL — ABNORMAL HIGH (ref 100–199)
HDL: 54 mg/dL (ref >39)
LDL Chol Calc (NIH): 152 mg/dL — ABNORMAL HIGH (ref 0–99)
LDL/HDL Ratio: 2.8 ratio (ref 0.0–3.6)
Triglycerides: 112 mg/dL (ref 0–149)
VLDL Cholesterol Cal: 20 mg/dL (ref 5–40)

## 2020-03-25 LAB — CBC WITH DIFFERENTIAL/PLATELET
Basophils Absolute: 0.1 10*3/uL (ref 0.0–0.2)
Basos: 1 %
EOS (ABSOLUTE): 0.2 10*3/uL (ref 0.0–0.4)
Eos: 2 %
Hematocrit: 41.1 % (ref 37.5–51.0)
Hemoglobin: 13.4 g/dL (ref 13.0–17.7)
Immature Grans (Abs): 0 10*3/uL (ref 0.0–0.1)
Immature Granulocytes: 0 %
Lymphocytes Absolute: 1.9 10*3/uL (ref 0.7–3.1)
Lymphs: 26 %
MCH: 29.4 pg (ref 26.6–33.0)
MCHC: 32.6 g/dL (ref 31.5–35.7)
MCV: 90 fL (ref 79–97)
Monocytes Absolute: 0.7 10*3/uL (ref 0.1–0.9)
Monocytes: 9 %
Neutrophils Absolute: 4.6 10*3/uL (ref 1.4–7.0)
Neutrophils: 62 %
Platelets: 210 10*3/uL (ref 150–450)
RBC: 4.56 x10E6/uL (ref 4.14–5.80)
RDW: 13 % (ref 11.6–15.4)
WBC: 7.4 10*3/uL (ref 3.4–10.8)

## 2020-03-25 LAB — HEMOGLOBIN A1C
Est. average glucose Bld gHb Est-mCnc: 154 mg/dL
Hgb A1c MFr Bld: 7 % — ABNORMAL HIGH (ref 4.8–5.6)

## 2020-03-25 LAB — INSULIN, RANDOM: INSULIN: 33.8 u[IU]/mL — ABNORMAL HIGH (ref 2.6–24.9)

## 2020-03-25 LAB — FOLATE: Folate: 8.5 ng/mL (ref >3.0)

## 2020-03-25 LAB — VITAMIN B12: Vitamin B-12: 589 pg/mL (ref 232–1245)

## 2020-03-25 LAB — VITAMIN D 25 HYDROXY (VIT D DEFICIENCY, FRACTURES): Vit D, 25-Hydroxy: 12.8 ng/mL — ABNORMAL LOW (ref 30.0–100.0)

## 2020-03-25 LAB — T4, FREE: Free T4: 1.01 ng/dL (ref 0.82–1.77)

## 2020-03-25 LAB — TSH: TSH: 3.24 u[IU]/mL (ref 0.450–4.500)

## 2020-03-25 LAB — T3: T3, Total: 131 ng/dL (ref 71–180)

## 2020-03-26 ENCOUNTER — Encounter (INDEPENDENT_AMBULATORY_CARE_PROVIDER_SITE_OTHER): Payer: Self-pay | Admitting: Family Medicine

## 2020-03-29 NOTE — Telephone Encounter (Signed)
Please r/s

## 2020-03-30 ENCOUNTER — Telehealth (INDEPENDENT_AMBULATORY_CARE_PROVIDER_SITE_OTHER): Payer: BC Managed Care – PPO | Admitting: Psychology

## 2020-04-07 ENCOUNTER — Other Ambulatory Visit: Payer: Self-pay

## 2020-04-07 ENCOUNTER — Ambulatory Visit (INDEPENDENT_AMBULATORY_CARE_PROVIDER_SITE_OTHER): Payer: BC Managed Care – PPO | Admitting: Family Medicine

## 2020-04-07 ENCOUNTER — Encounter (INDEPENDENT_AMBULATORY_CARE_PROVIDER_SITE_OTHER): Payer: Self-pay | Admitting: Family Medicine

## 2020-04-07 VITALS — BP 120/77 | HR 93 | Temp 98.7°F | Ht 74.0 in | Wt 340.0 lb

## 2020-04-07 DIAGNOSIS — Z6841 Body Mass Index (BMI) 40.0 and over, adult: Secondary | ICD-10-CM

## 2020-04-07 DIAGNOSIS — E1169 Type 2 diabetes mellitus with other specified complication: Secondary | ICD-10-CM | POA: Diagnosis not present

## 2020-04-07 DIAGNOSIS — Z9189 Other specified personal risk factors, not elsewhere classified: Secondary | ICD-10-CM

## 2020-04-07 DIAGNOSIS — E559 Vitamin D deficiency, unspecified: Secondary | ICD-10-CM

## 2020-04-07 DIAGNOSIS — K760 Fatty (change of) liver, not elsewhere classified: Secondary | ICD-10-CM | POA: Diagnosis not present

## 2020-04-07 DIAGNOSIS — E7849 Other hyperlipidemia: Secondary | ICD-10-CM

## 2020-04-07 MED ORDER — VITAMIN D (ERGOCALCIFEROL) 1.25 MG (50000 UNIT) PO CAPS: 50000.0000 [IU] | ORAL_CAPSULE | ORAL | 0 refills | Status: DC

## 2020-04-09 ENCOUNTER — Encounter (INDEPENDENT_AMBULATORY_CARE_PROVIDER_SITE_OTHER): Payer: Self-pay | Admitting: Family Medicine

## 2020-04-12 NOTE — Progress Notes (Signed)
Chief Complaint:   OBESITY Luis Russell is here to discuss his progress with his obesity treatment plan along with follow-up of his obesity related diagnoses. Luis Russell is on the Category 4 Plan and states he is following his eating plan approximately 98% of the time. Luis Russell states he was doing a lot of walking at [employer].  Today's visit was #: 2 Starting weight: 342 lbs Starting date: 03/24/2020 Today's weight: 340 lbs Today's date: 04/07/2020 Total lbs lost to date: 2 Total lbs lost since last in-office visit: 2  Interim History: Luis Russell overall felt he followed the plan. His hunger is well controlled, and he did some celebrations eating and vacation eating in [facility] but made healthier choices. He denies cravings. He notes struggles as his wife Santiago Glad and others in the home do not eat healthy most of the time.  Subjective:   1. Type 2 diabetes mellitus with other specified complication, without long-term current use of insulin (HCC) Luis Russell has a new diagnosis of diabetes mellitus. His A1c is 7.0, fast in BGs range at 132, and fasting insulin level is at 33.0. I discussed labs with the patient today.  2. Other hyperlipidemia Luis Russell's LDL is elevated at 152, and his triglycerides and HDL are within normal limits. I discussed labs with the patient today.  3. Vitamin D deficiency Luis Russell's Vit D level is 12.8 on his recent labs. I discussed labs with the patient today.  4. Nonalcoholic hepatosteatosis Luis Russell with elevated ALT, and has been longstanding and stable. I discussed labs with the patient today.  5. At risk for deficient knowledge of diabetes mellitus Luis Russell is at risk for deficient knowledge of diabetes mellitus.   Assessment/Plan:   1. Type 2 diabetes mellitus with other specified complication, without long-term current use of insulin (HCC) Good blood sugar control is important to decrease the likelihood of diabetic complications such as nephropathy, neuropathy, limb  loss, blindness, coronary artery disease, and death. Intensive lifestyle modification including diet, exercise and weight loss are the first line of treatment for diabetes. Luis Russell was provided significant education today. He will work on diet and weight loss, and we will recheck A1c in 3 months. He declined metformin today, and will consider in the future again.  2. Other hyperlipidemia Cardiovascular risk and specific lipid/LDL goals reviewed. We discussed several lifestyle modifications today. Luis Russell will continue to work on diet, exercise and weight loss efforts. Orders and follow up as documented in patient record.   3. Vitamin D deficiency Low Vitamin D level contributes to fatigue and are associated with obesity, breast, and colon cancer. Luis Russell agreed to start prescription Vitamin D 50,000 IU every week with no refills. He will follow-up for routine testing of Vitamin D, at least 2-3 times per year to avoid over-replacement.  - Vitamin D, Ergocalciferol, (DRISDOL) 1.25 MG (50000 UNIT) CAPS capsule; Take 1 capsule (50,000 Units total) by mouth every 7 (seven) days.  Dispense: 4 capsule; Refill: 0  4. Nonalcoholic hepatosteatosis We will recheck labs in 3 months, after following nutrition plan and weight loss.  5. At risk for deficient knowledge of diabetes mellitus Luis Russell was given approximately 30 minutes of diabetes education and counseling today. We discussed intensive lifestyle modifications today with an emphasis on weight loss as well as increasing exercise and decreasing simple carbohydrates in his diet. We also reviewed medication options with an emphasis on risk versus benefit of those discussed.   6. Class 3 severe obesity with serious comorbidity and body mass  index (BMI) of 40.0 to 44.9 in adult, unspecified obesity type [facility]) Luis Russell is currently in the action stage of change. As such, his goal is to continue with weight loss efforts. He has agreed to the Category 4 Plan.    Exercise goals: As is.  Behavioral modification strategies: decreasing eating out, meal planning and cooking strategies and decreasing junk food.  Luis Russell has agreed to follow-up with our clinic in 2 weeks. He was informed of the importance of frequent follow-up visits to maximize his success with intensive lifestyle modifications for his multiple health conditions.   Objective:   Blood pressure 120/77, pulse 93, temperature 98.7 F (37.1 C), temperature source Oral, height 6\' 2"  (1.88 m), weight (!) 340 lb (154.2 kg), SpO2 96 %. Body mass index is 43.65 kg/m.  General: Cooperative, alert, well developed, in no acute distress. HEENT: Conjunctivae and lids unremarkable. Cardiovascular: Regular rhythm.  Lungs: Normal work of breathing. Neurologic: No focal deficits.   Lab Results  Component Value Date   CREATININE 1.01 03/24/2020   BUN 19 03/24/2020   NA 138 03/24/2020   K 4.6 03/24/2020   CL 101 03/24/2020   CO2 26 03/24/2020   Lab Results  Component Value Date   ALT 55 (H) 03/24/2020   AST 36 03/24/2020   ALKPHOS 94 03/24/2020   BILITOT 0.6 03/24/2020   Lab Results  Component Value Date   HGBA1C 7.0 (H) 03/24/2020   HGBA1C 5.8 (H) 06/08/2016   Lab Results  Component Value Date   INSULIN 33.8 (H) 03/24/2020   Lab Results  Component Value Date   TSH 3.240 03/24/2020   Lab Results  Component Value Date   CHOL 226 (H) 03/24/2020   HDL 54 03/24/2020   LDLCALC 152 (H) 03/24/2020   TRIG 112 03/24/2020   Lab Results  Component Value Date   WBC 7.4 03/24/2020   HGB 13.4 03/24/2020   HCT 41.1 03/24/2020   MCV 90 03/24/2020   PLT 210 03/24/2020   No results found for: IRON, TIBC, FERRITIN  Attestation Statements:   Reviewed by clinician on day of visit: allergies, medications, problem list, medical history, surgical history, family history, social history, and previous encounter notes.   I, 03/26/2020, am acting as transcriptionist for Burt Knack,  MD.  I have reviewed the above documentation for accuracy and completeness, and I agree with the above. -  [name], MD

## 2020-04-21 ENCOUNTER — Encounter (INDEPENDENT_AMBULATORY_CARE_PROVIDER_SITE_OTHER): Payer: Self-pay | Admitting: Family Medicine

## 2020-04-21 NOTE — Telephone Encounter (Signed)
Please advise 

## 2020-04-22 ENCOUNTER — Encounter (INDEPENDENT_AMBULATORY_CARE_PROVIDER_SITE_OTHER): Payer: Self-pay | Admitting: Family Medicine

## 2020-04-25 ENCOUNTER — Other Ambulatory Visit: Payer: Self-pay

## 2020-04-25 ENCOUNTER — Ambulatory Visit (INDEPENDENT_AMBULATORY_CARE_PROVIDER_SITE_OTHER): Payer: BC Managed Care – PPO | Admitting: Family Medicine

## 2020-04-25 VITALS — BP 115/80 | HR 77 | Temp 98.3°F | Ht 74.0 in | Wt 335.0 lb

## 2020-04-25 DIAGNOSIS — E559 Vitamin D deficiency, unspecified: Secondary | ICD-10-CM | POA: Diagnosis not present

## 2020-04-25 DIAGNOSIS — Z6841 Body Mass Index (BMI) 40.0 and over, adult: Secondary | ICD-10-CM | POA: Diagnosis not present

## 2020-04-25 MED ORDER — VITAMIN D (ERGOCALCIFEROL) 1.25 MG (50000 UNIT) PO CAPS: 50000.0000 [IU] | ORAL_CAPSULE | ORAL | 0 refills | Status: DC

## 2020-04-26 NOTE — Progress Notes (Signed)
Chief Complaint:   OBESITY Jahel is here to discuss his progress with his obesity treatment plan along with follow-up of his obesity related diagnoses. Conway is on the Category 4 Plan and states he is following his eating plan approximately 94-95% of the time. Macario states he is doing 0 minutes 0 times per week.  Today's visit was #: 3 Starting weight: 342 lbs Starting date: 03/24/2020 Today's weight: 335 lbs Today's date: 04/25/2020 Total lbs lost to date: 7 Total lbs lost since last in-office visit: 5  Interim History: Draydon continues to do very well with weight loss on his Category 4 plan. His hunger is controlled but he has a lot of questions about nutrition and weight loss.  Subjective:   1. Vitamin D deficiency Sherrill is stable on Vit D, and he denies nausea, vomiting, or muscle weakness.  Assessment/Plan:   1. Vitamin D deficiency Low Vitamin D level contributes to fatigue and are associated with obesity, breast, and colon cancer. We will refill prescription Vitamin D for 1 month. Declin will follow-up for routine testing of Vitamin D, at least 2-3 times per year to avoid over-replacement. We will recheck labs in 1 month.  - Vitamin D, Ergocalciferol, (DRISDOL) 1.25 MG (50000 UNIT) CAPS capsule; Take 1 capsule (50,000 Units total) by mouth every 7 (seven) days.  Dispense: 4 capsule; Refill: 0  2. Class 3 severe obesity with serious comorbidity and body mass index (BMI) of 40.0 to 44.9 in adult, unspecified obesity type (HCC) Fritz is currently in the action stage of change. As such, his goal is to continue with weight loss efforts. He has agreed to the Category 4 Plan.   Behavioral modification strategies: meal planning and cooking strategies and travel eating strategies.  Chrisotpher has agreed to follow-up with our clinic in 2 to 3 weeks. He was informed of the importance of frequent follow-up visits to maximize his success with intensive lifestyle modifications for his  multiple health conditions.   Objective:   Blood pressure 115/80, pulse 77, temperature 98.3 F (36.8 C), temperature source Oral, height 6\' 2"  (1.88 m), weight (!) 335 lb (152 kg), SpO2 94 %. Body mass index is 43.01 kg/m.  General: Cooperative, alert, well developed, in no acute distress. HEENT: Conjunctivae and lids unremarkable. Cardiovascular: Regular rhythm.  Lungs: Normal work of breathing. Neurologic: No focal deficits.   Lab Results  Component Value Date   CREATININE 1.01 03/24/2020   BUN 19 03/24/2020   NA 138 03/24/2020   K 4.6 03/24/2020   CL 101 03/24/2020   CO2 26 03/24/2020   Lab Results  Component Value Date   ALT 55 (H) 03/24/2020   AST 36 03/24/2020   ALKPHOS 94 03/24/2020   BILITOT 0.6 03/24/2020   Lab Results  Component Value Date   HGBA1C 7.0 (H) 03/24/2020   HGBA1C 5.8 (H) 06/08/2016   Lab Results  Component Value Date   INSULIN 33.8 (H) 03/24/2020   Lab Results  Component Value Date   TSH 3.240 03/24/2020   Lab Results  Component Value Date   CHOL 226 (H) 03/24/2020   HDL 54 03/24/2020   LDLCALC 152 (H) 03/24/2020   TRIG 112 03/24/2020   Lab Results  Component Value Date   WBC 7.4 03/24/2020   HGB 13.4 03/24/2020   HCT 41.1 03/24/2020   MCV 90 03/24/2020   PLT 210 03/24/2020   No results found for: IRON, TIBC, FERRITIN  Attestation Statements:   Reviewed by clinician  on day of visit: allergies, medications, problem list, medical history, surgical history, family history, social history, and previous encounter notes.  Time spent on visit including pre-visit chart review and post-visit care and charting was 32 minutes.    I, Burt Knack, am acting as transcriptionist for [name], MD.  I have reviewed the above documentation for accuracy and completeness, and I agree with the above. -

## 2020-04-29 ENCOUNTER — Other Ambulatory Visit (INDEPENDENT_AMBULATORY_CARE_PROVIDER_SITE_OTHER): Payer: Self-pay | Admitting: Family Medicine

## 2020-04-29 DIAGNOSIS — E559 Vitamin D deficiency, unspecified: Secondary | ICD-10-CM

## 2020-05-13 ENCOUNTER — Encounter (INDEPENDENT_AMBULATORY_CARE_PROVIDER_SITE_OTHER): Payer: Self-pay | Admitting: Family Medicine

## 2020-05-16 ENCOUNTER — Other Ambulatory Visit: Payer: Self-pay

## 2020-05-16 ENCOUNTER — Encounter (INDEPENDENT_AMBULATORY_CARE_PROVIDER_SITE_OTHER): Payer: Self-pay | Admitting: Family Medicine

## 2020-05-16 ENCOUNTER — Ambulatory Visit (INDEPENDENT_AMBULATORY_CARE_PROVIDER_SITE_OTHER): Payer: BC Managed Care – PPO | Admitting: Family Medicine

## 2020-05-16 VITALS — BP 104/70 | HR 91 | Temp 98.1°F | Ht 74.0 in | Wt 335.0 lb

## 2020-05-16 DIAGNOSIS — Z6841 Body Mass Index (BMI) 40.0 and over, adult: Secondary | ICD-10-CM

## 2020-05-16 DIAGNOSIS — E559 Vitamin D deficiency, unspecified: Secondary | ICD-10-CM | POA: Diagnosis not present

## 2020-05-16 DIAGNOSIS — Z9189 Other specified personal risk factors, not elsewhere classified: Secondary | ICD-10-CM

## 2020-05-16 DIAGNOSIS — F3289 Other specified depressive episodes: Secondary | ICD-10-CM | POA: Diagnosis not present

## 2020-05-16 MED ORDER — VITAMIN D (ERGOCALCIFEROL) 1.25 MG (50000 UNIT) PO CAPS: 50000.0000 [IU] | ORAL_CAPSULE | ORAL | 0 refills | Status: DC

## 2020-05-16 NOTE — Telephone Encounter (Signed)
Review.

## 2020-05-18 NOTE — Progress Notes (Signed)
Chief Complaint:   OBESITY Luis Russell is here to discuss his progress with his obesity treatment plan along with follow-up of his obesity related diagnoses. Luis Russell is on the Category 4 Plan and states he is following his eating plan approximately 90-95% of the time. Luis Russell states he is doing 0 minutes 0 times per week.  Today's visit was #: 4 Starting weight: 342 lbs Starting date: 03/24/2020 Today's weight: 335 lbs Today's date: 05/16/2020 Total lbs lost to date: 7 Total lbs lost since last in-office visit: 0  Interim History: Luis Russell has done well maintaining his weight and has even lost 2 more lbs, but he is up in water weight. He struggles with family sabotage especially at dinner time.  Subjective:   1. Vitamin D deficiency Luis Russell is stable on Vit D, and he denies nausea or vomiting. His Vit D level is not yet at goal.  2. Other depression Luis Russell is stable on medications, and his blood pressure is well controlled. He is working on decreased emotional eating.  3. At risk for osteoporosis Luis Russell is at higher risk of osteopenia and osteoporosis due to Vitamin D deficiency.   Assessment/Plan:   1. Vitamin D deficiency Low Vitamin D level contributes to fatigue and are associated with obesity, breast, and colon cancer. We will refill prescription Vitamin D for 1 month. Luis Russell will follow-up for routine testing of Vitamin D, at least 2-3 times per year to avoid over-replacement.  - Vitamin D, Ergocalciferol, (DRISDOL) 1.25 MG (50000 UNIT) CAPS capsule; Take 1 capsule (50,000 Units total) by mouth every 7 (seven) days.  Dispense: 4 capsule; Refill: 0  2. Other depression Cognitive behavioral therapy to help decrease emotional eating was discussed today. Luis Russell will continue his medications and will follow up as directed. Orders and follow up as documented in patient record.   3. At risk for osteoporosis Luis Russell was given approximately 15 minutes of osteoporosis prevention  counseling today. Luis Russell is at risk for osteopenia and osteoporosis due to his Vitamin D deficiency. He was encouraged to take his Vitamin D and follow his higher calcium diet and increase strengthening exercise to help strengthen his bones and decrease his risk of osteopenia and osteoporosis.  Repetitive spaced learning was employed today to elicit superior memory formation and behavioral change.  4. Class 3 severe obesity with serious comorbidity and body mass index (BMI) of 40.0 to 44.9 in adult, unspecified obesity type (HCC) Luis Russell is currently in the action stage of change. As such, his goal is to continue with weight loss efforts. He has agreed to the Category 4 Plan and keeping a food journal and adhering to recommended goals of 500-700 calories and 50+ grams of protein at supper daily.   Behavioral modification strategies: increasing lean protein intake, meal planning and cooking strategies and dealing with family or coworker sabotage.  Luis Russell has agreed to follow-up with our clinic in 3 weeks. He was informed of the importance of frequent follow-up visits to maximize his success with intensive lifestyle modifications for his multiple health conditions.   Objective:   Blood pressure 104/70, pulse 91, temperature 98.1 F (36.7 C), temperature source Oral, height 6\' 2"  (1.88 m), weight (!) 335 lb (152 kg), SpO2 95 %. Body mass index is 43.01 kg/m.  General: Cooperative, alert, well developed, in no acute distress. HEENT: Conjunctivae and lids unremarkable. Cardiovascular: Regular rhythm.  Lungs: Normal work of breathing. Neurologic: No focal deficits.   Lab Results  Component Value Date   CREATININE  1.01 03/24/2020   BUN 19 03/24/2020   NA 138 03/24/2020   K 4.6 03/24/2020   CL 101 03/24/2020   CO2 26 03/24/2020   Lab Results  Component Value Date   ALT 55 (H) 03/24/2020   AST 36 03/24/2020   ALKPHOS 94 03/24/2020   BILITOT 0.6 03/24/2020   Lab Results  Component Value  Date   HGBA1C 7.0 (H) 03/24/2020   HGBA1C 5.8 (H) 06/08/2016   Lab Results  Component Value Date   INSULIN 33.8 (H) 03/24/2020   Lab Results  Component Value Date   TSH 3.240 03/24/2020   Lab Results  Component Value Date   CHOL 226 (H) 03/24/2020   HDL 54 03/24/2020   LDLCALC 152 (H) 03/24/2020   TRIG 112 03/24/2020   Lab Results  Component Value Date   WBC 7.4 03/24/2020   HGB 13.4 03/24/2020   HCT 41.1 03/24/2020   MCV 90 03/24/2020   PLT 210 03/24/2020   No results found for: IRON, TIBC, FERRITIN  Attestation Statements:   Reviewed by clinician on day of visit: allergies, medications, problem list, medical history, surgical history, family history, social history, and previous encounter notes.   I, Burt Knack, am acting as transcriptionist for [name], MD.  I have reviewed the above documentation for accuracy and completeness, and I agree with the above. -  [name], MD

## 2020-05-30 ENCOUNTER — Other Ambulatory Visit: Payer: Self-pay

## 2020-05-30 ENCOUNTER — Ambulatory Visit (INDEPENDENT_AMBULATORY_CARE_PROVIDER_SITE_OTHER): Payer: BC Managed Care – PPO | Admitting: Family Medicine

## 2020-05-30 ENCOUNTER — Encounter (INDEPENDENT_AMBULATORY_CARE_PROVIDER_SITE_OTHER): Payer: Self-pay | Admitting: Family Medicine

## 2020-05-30 VITALS — BP 113/75 | HR 82 | Temp 98.5°F | Ht 74.0 in | Wt 335.0 lb

## 2020-05-30 DIAGNOSIS — E559 Vitamin D deficiency, unspecified: Secondary | ICD-10-CM

## 2020-05-30 DIAGNOSIS — Z9189 Other specified personal risk factors, not elsewhere classified: Secondary | ICD-10-CM | POA: Diagnosis not present

## 2020-05-30 DIAGNOSIS — E1169 Type 2 diabetes mellitus with other specified complication: Secondary | ICD-10-CM

## 2020-05-30 DIAGNOSIS — Z6841 Body Mass Index (BMI) 40.0 and over, adult: Secondary | ICD-10-CM

## 2020-05-31 MED ORDER — VITAMIN D (ERGOCALCIFEROL) 1.25 MG (50000 UNIT) PO CAPS: 50000.0000 [IU] | ORAL_CAPSULE | ORAL | 0 refills | Status: DC

## 2020-05-31 MED ORDER — RYBELSUS 3 MG PO TABS: 1.0000 | ORAL_TABLET | Freq: Every day | ORAL | 0 refills | Status: DC

## 2020-05-31 NOTE — Progress Notes (Signed)
Chief Complaint:   OBESITY Luis Russell is here to discuss his progress with his obesity treatment plan along with follow-up of his obesity related diagnoses. Luis Russell is on the Category 4 Plan and keeping a food journal and adhering to recommended goals of 500-700 calories and 50+ grams of protein at supper daily and states he is following his eating plan approximately 95-98% of the time. Luis Russell states he is doing 0 minutes 0 times per week.  Today's visit was #: 5 Starting weight: 342 lbs Starting date: 03/24/2020 Today's weight: 335 lbs Today's date: 05/30/2020 Total lbs lost to date: 7 Total lbs lost since last in-office visit: 0  Interim History: Luis Russell continues to maintain his weight. He is frustrated with his lack of weight loss even though he is doing a lot of things right. He is meeting his protein goals and his sugar intake is not excessive.  Subjective:   1. Type 2 diabetes mellitus with other specified complication, without long-term current use of insulin (HCC) Luis Russell's diabetes is not yet controlled, and he had been on metformin in the past and did not tolerate it. He is struggling with polyphagia. Last A1c was 7.0.  2. Vitamin D deficiency Luis Russell is stable on Vit D, and he denies nausea, vomiting, or muscle weakness.  3. At risk for hyperglycemia Luis Russell is at increased risk for hyperglycemia due to changes in diet, diagnosis of diabetes, and/or insulin use.   Assessment/Plan:   1. Type 2 diabetes mellitus with other specified complication, without long-term current use of insulin (HCC) Good blood sugar control is important to decrease the likelihood of diabetic complications such as nephropathy, neuropathy, limb loss, blindness, coronary artery disease, and death. Intensive lifestyle modification including diet, exercise and weight loss are the first line of treatment for diabetes. Luis Russell agreed to start Rybelsus 3 mg q AM with no refills. He was informed that his fasting  insulin is contributing to his inability to lose weight and start a GLP-1.  - Semaglutide (RYBELSUS) 3 MG TABS; Take 1 tablet by mouth daily.  Dispense: 30 tablet; Refill: 0  2. Vitamin D deficiency Low Vitamin D level contributes to fatigue and are associated with obesity, breast, and colon cancer. We will refill prescription Vitamin D for 1 month. Luis Russell will follow-up for routine testing of Vitamin D, at least 2-3 times per year to avoid over-replacement.  - Vitamin D, Ergocalciferol, (DRISDOL) 1.25 MG (50000 UNIT) CAPS capsule; Take 1 capsule (50,000 Units total) by mouth every 7 (seven) days.  Dispense: 4 capsule; Refill: 0  3. At risk for hyperglycemia Luis Russell was given approximately 15 minutes of counseling today regarding prevention of hyperglycemia. He was advised of hyperglycemia causes and the fact hyperglycemia is often asymptomatic. Luis Russell was instructed to avoid skipping meals, eat regular protein rich meals and schedule low calorie but protein rich snacks as needed.   Repetitive spaced learning was employed today to elicit superior memory formation and behavioral change  4. Class 3 severe obesity with serious comorbidity and body mass index (BMI) of 40.0 to 44.9 in adult, unspecified obesity type (HCC) Luis Russell is currently in the action stage of change. As such, his goal is to continue with weight loss efforts. He has agreed to change to keeping a food journal and adhering to recommended goals of 1500-1800 calories and 100+ grams of protein daily.   Behavioral modification strategies: meal planning and cooking strategies and keeping a strict food journal.  Luis Russell has agreed to follow-up with  our clinic in 2 to 3 weeks. He was informed of the importance of frequent follow-up visits to maximize his success with intensive lifestyle modifications for his multiple health conditions.   Objective:   Blood pressure 113/75, pulse 82, temperature 98.5 F (36.9 C), temperature source Oral,  height 6\' 2"  (1.88 m), weight (!) 335 lb (152 kg), SpO2 94 %. Body mass index is 43.01 kg/m.  General: Cooperative, alert, well developed, in no acute distress. HEENT: Conjunctivae and lids unremarkable. Cardiovascular: Regular rhythm.  Lungs: Normal work of breathing. Neurologic: No focal deficits.   Lab Results  Component Value Date   CREATININE 1.01 03/24/2020   BUN 19 03/24/2020   NA 138 03/24/2020   K 4.6 03/24/2020   CL 101 03/24/2020   CO2 26 03/24/2020   Lab Results  Component Value Date   ALT 55 (H) 03/24/2020   AST 36 03/24/2020   ALKPHOS 94 03/24/2020   BILITOT 0.6 03/24/2020   Lab Results  Component Value Date   HGBA1C 7.0 (H) 03/24/2020   HGBA1C 5.8 (H) 06/08/2016   Lab Results  Component Value Date   INSULIN 33.8 (H) 03/24/2020   Lab Results  Component Value Date   TSH 3.240 03/24/2020   Lab Results  Component Value Date   CHOL 226 (H) 03/24/2020   HDL 54 03/24/2020   LDLCALC 152 (H) 03/24/2020   TRIG 112 03/24/2020   Lab Results  Component Value Date   WBC 7.4 03/24/2020   HGB 13.4 03/24/2020   HCT 41.1 03/24/2020   MCV 90 03/24/2020   PLT 210 03/24/2020   No results found for: IRON, TIBC, FERRITIN  Attestation Statements:   Reviewed by clinician on day of visit: allergies, medications, problem list, medical history, surgical history, family history, social history, and previous encounter notes.   I, 03/26/2020, am acting as transcriptionist for Burt Knack, MD.  I have reviewed the above documentation for accuracy and completeness, and I agree with the above. -  [name], MD

## 2020-06-01 ENCOUNTER — Encounter (INDEPENDENT_AMBULATORY_CARE_PROVIDER_SITE_OTHER): Payer: Self-pay | Admitting: Family Medicine

## 2020-06-02 NOTE — Telephone Encounter (Signed)
Please advise 

## 2020-06-22 ENCOUNTER — Encounter (INDEPENDENT_AMBULATORY_CARE_PROVIDER_SITE_OTHER): Payer: Self-pay | Admitting: Family Medicine

## 2020-06-22 ENCOUNTER — Ambulatory Visit (INDEPENDENT_AMBULATORY_CARE_PROVIDER_SITE_OTHER): Payer: BC Managed Care – PPO | Admitting: Family Medicine

## 2020-06-22 ENCOUNTER — Other Ambulatory Visit: Payer: Self-pay

## 2020-06-22 VITALS — BP 140/74 | HR 80 | Temp 98.3°F | Ht 74.0 in | Wt 322.0 lb

## 2020-06-22 DIAGNOSIS — E559 Vitamin D deficiency, unspecified: Secondary | ICD-10-CM | POA: Diagnosis not present

## 2020-06-22 DIAGNOSIS — R030 Elevated blood-pressure reading, without diagnosis of hypertension: Secondary | ICD-10-CM

## 2020-06-22 DIAGNOSIS — E1169 Type 2 diabetes mellitus with other specified complication: Secondary | ICD-10-CM

## 2020-06-22 DIAGNOSIS — Z6841 Body Mass Index (BMI) 40.0 and over, adult: Secondary | ICD-10-CM

## 2020-06-22 MED ORDER — RYBELSUS 3 MG PO TABS: 1.0000 | ORAL_TABLET | Freq: Every day | ORAL | 0 refills | Status: DC

## 2020-06-22 MED ORDER — VITAMIN D (ERGOCALCIFEROL) 1.25 MG (50000 UNIT) PO CAPS: 50000.0000 [IU] | ORAL_CAPSULE | ORAL | 0 refills | Status: DC

## 2020-06-23 ENCOUNTER — Other Ambulatory Visit (INDEPENDENT_AMBULATORY_CARE_PROVIDER_SITE_OTHER): Payer: Self-pay | Admitting: Family Medicine

## 2020-06-23 DIAGNOSIS — E559 Vitamin D deficiency, unspecified: Secondary | ICD-10-CM

## 2020-06-23 NOTE — Progress Notes (Signed)
Chief Complaint:   OBESITY Luis Russell is here to discuss his progress with his obesity treatment plan along with follow-up of his obesity related diagnoses. Luis Russell is on the Category 4 Plan and states he is following his eating plan approximately 95% of the time. Luis Russell states he is doing 0 minutes 0 times per week.  Today's visit was #: 6 Starting weight: 342 lbs Starting date: 03/24/2020 Today's weight: 322 lbs Today's date: 06/22/2020 Total lbs lost to date: 20 Total lbs lost since last in-office visit: 13  Interim History: Luis Russell has done well with weight loss on his Category 4 plan. His hunger is mostly controlled but life is hectic right now, especially with COVID surge and he is not sleeping as well.  Subjective:   1. Type 2 diabetes mellitus with other specified complication, without long-term current use of insulin (HCC) Luis Russell is stable on his medications, and he notes decreased hunger and mild GI upset but not too much.  2. Elevated blood pressure reading Luis Russell's blood pressure is elevated today, normally well controlled. He denies chest pain or headaches.  3. Vitamin D deficiency Luis Russell is stable on Vit D, and he denies nausea or vomiting. He requests a refill today.  Assessment/Plan:   1. Type 2 diabetes mellitus with other specified complication, without long-term current use of insulin (HCC) Good blood sugar control is important to decrease the likelihood of diabetic complications such as nephropathy, neuropathy, limb loss, blindness, coronary artery disease, and death. Intensive lifestyle modification including diet, exercise and weight loss are the first line of treatment for diabetes. We will refill Rybelsus for 1 month. Luis Russell is to remember to eat approximately 30 minutes after taking it.  - Semaglutide (RYBELSUS) 3 MG TABS; Take 1 tablet by mouth daily.  Dispense: 30 tablet; Refill: 0  2. Elevated blood pressure reading Luis Russell is working on healthy weight  loss and exercise to improve blood pressure control. We will watch for signs of hypotension as he continues his lifestyle modifications. Luis Russell is to increase his water intake, and we will recheck his blood pressure in 2 weeks.  3. Vitamin D deficiency Low Vitamin D level contributes to fatigue and are associated with obesity, breast, and colon cancer. We will refill prescription Vitamin D for 1 month. Luis Russell will follow-up for routine testing of Vitamin D, at least 2-3 times per year to avoid over-replacement.  - Vitamin D, Ergocalciferol, (DRISDOL) 1.25 MG (50000 UNIT) CAPS capsule; Take 1 capsule (50,000 Units total) by mouth every 7 (seven) days.  Dispense: 4 capsule; Refill: 0  4. Class 3 severe obesity with serious comorbidity and body mass index (BMI) of 40.0 to 44.9 in adult, unspecified obesity type (HCC) Luis Russell is currently in the action stage of change. As such, his goal is to continue with weight loss efforts. He has agreed to the Category 4 Plan.   Behavioral modification strategies: increasing lean protein intake.  Luis Russell has agreed to follow-up with our clinic in 2 weeks. He was informed of the importance of frequent follow-up visits to maximize his success with intensive lifestyle modifications for his multiple health conditions.   Objective:   Blood pressure 140/74, pulse 80, temperature 98.3 F (36.8 C), temperature source Oral, height 6\' 2"  (1.88 m), weight (!) 322 lb (146.1 kg), SpO2 93 %. Body mass index is 41.34 kg/m.  General: Cooperative, alert, well developed, in no acute distress. HEENT: Conjunctivae and lids unremarkable. Cardiovascular: Regular rhythm.  Lungs: Normal work of breathing. Neurologic:  No focal deficits.   Lab Results  Component Value Date   CREATININE 1.01 03/24/2020   BUN 19 03/24/2020   NA 138 03/24/2020   K 4.6 03/24/2020   CL 101 03/24/2020   CO2 26 03/24/2020   Lab Results  Component Value Date   ALT 55 (H) 03/24/2020   AST 36  03/24/2020   ALKPHOS 94 03/24/2020   BILITOT 0.6 03/24/2020   Lab Results  Component Value Date   HGBA1C 7.0 (H) 03/24/2020   HGBA1C 5.8 (H) 06/08/2016   Lab Results  Component Value Date   INSULIN 33.8 (H) 03/24/2020   Lab Results  Component Value Date   TSH 3.240 03/24/2020   Lab Results  Component Value Date   CHOL 226 (H) 03/24/2020   HDL 54 03/24/2020   LDLCALC 152 (H) 03/24/2020   TRIG 112 03/24/2020   Lab Results  Component Value Date   WBC 7.4 03/24/2020   HGB 13.4 03/24/2020   HCT 41.1 03/24/2020   MCV 90 03/24/2020   PLT 210 03/24/2020   No results found for: IRON, TIBC, FERRITIN  Attestation Statements:   Reviewed by clinician on day of visit: allergies, medications, problem list, medical history, surgical history, family history, social history, and previous encounter notes.   I, Burt Knack, am acting as transcriptionist for [name], MD.  I have reviewed the above documentation for accuracy and completeness, and I agree with the above. -  [name], MD

## 2020-07-13 ENCOUNTER — Encounter (INDEPENDENT_AMBULATORY_CARE_PROVIDER_SITE_OTHER): Payer: Self-pay | Admitting: Family Medicine

## 2020-07-13 ENCOUNTER — Ambulatory Visit (INDEPENDENT_AMBULATORY_CARE_PROVIDER_SITE_OTHER): Payer: BC Managed Care – PPO | Admitting: Family Medicine

## 2020-07-13 ENCOUNTER — Other Ambulatory Visit: Payer: Self-pay

## 2020-07-13 VITALS — BP 108/68 | HR 83 | Temp 98.1°F | Ht 74.0 in | Wt 322.0 lb

## 2020-07-13 DIAGNOSIS — E559 Vitamin D deficiency, unspecified: Secondary | ICD-10-CM | POA: Diagnosis not present

## 2020-07-13 DIAGNOSIS — Z6841 Body Mass Index (BMI) 40.0 and over, adult: Secondary | ICD-10-CM

## 2020-07-13 DIAGNOSIS — E7849 Other hyperlipidemia: Secondary | ICD-10-CM | POA: Diagnosis not present

## 2020-07-13 DIAGNOSIS — F3289 Other specified depressive episodes: Secondary | ICD-10-CM | POA: Diagnosis not present

## 2020-07-13 DIAGNOSIS — E1169 Type 2 diabetes mellitus with other specified complication: Secondary | ICD-10-CM

## 2020-07-13 DIAGNOSIS — Z9189 Other specified personal risk factors, not elsewhere classified: Secondary | ICD-10-CM

## 2020-07-13 MED ORDER — RYBELSUS 7 MG PO TABS: 1.0000 | ORAL_TABLET | Freq: Every day | ORAL | 0 refills | Status: DC

## 2020-07-13 MED ORDER — VITAMIN D (ERGOCALCIFEROL) 1.25 MG (50000 UNIT) PO CAPS: 50000.0000 [IU] | ORAL_CAPSULE | ORAL | 0 refills | Status: DC

## 2020-07-13 MED ORDER — BUPROPION HCL ER (SR) 150 MG PO TB12: 150.0000 mg | ORAL_TABLET | Freq: Every day | ORAL | 0 refills | Status: DC

## 2020-07-14 LAB — LIPID PANEL WITH LDL/HDL RATIO
Cholesterol, Total: 217 mg/dL — ABNORMAL HIGH (ref 100–199)
HDL: 44 mg/dL (ref >39)
LDL Chol Calc (NIH): 155 mg/dL — ABNORMAL HIGH (ref 0–99)
LDL/HDL Ratio: 3.5 ratio (ref 0.0–3.6)
Triglycerides: 98 mg/dL (ref 0–149)
VLDL Cholesterol Cal: 18 mg/dL (ref 5–40)

## 2020-07-14 LAB — COMPREHENSIVE METABOLIC PANEL
ALT: 30 IU/L (ref 0–44)
AST: 22 IU/L (ref 0–40)
Albumin/Globulin Ratio: 1.6 (ref 1.2–2.2)
Albumin: 4.3 g/dL (ref 3.8–4.9)
Alkaline Phosphatase: 81 IU/L (ref 44–121)
BUN/Creatinine Ratio: 17 (ref 9–20)
BUN: 16 mg/dL (ref 6–24)
Bilirubin Total: 0.5 mg/dL (ref 0.0–1.2)
CO2: 25 mmol/L (ref 20–29)
Calcium: 8.8 mg/dL (ref 8.7–10.2)
Chloride: 102 mmol/L (ref 96–106)
Creatinine, Ser: 0.94 mg/dL (ref 0.76–1.27)
GFR calc Af Amer: 104 mL/min/{1.73_m2} (ref >59)
GFR calc non Af Amer: 90 mL/min/{1.73_m2} (ref >59)
Globulin, Total: 2.7 g/dL (ref 1.5–4.5)
Glucose: 94 mg/dL (ref 65–99)
Potassium: 4.3 mmol/L (ref 3.5–5.2)
Sodium: 139 mmol/L (ref 134–144)
Total Protein: 7 g/dL (ref 6.0–8.5)

## 2020-07-14 LAB — INSULIN, RANDOM: INSULIN: 22.1 u[IU]/mL (ref 2.6–24.9)

## 2020-07-14 LAB — HEMOGLOBIN A1C
Est. average glucose Bld gHb Est-mCnc: 117 mg/dL
Hgb A1c MFr Bld: 5.7 % — ABNORMAL HIGH (ref 4.8–5.6)

## 2020-07-14 LAB — VITAMIN D 25 HYDROXY (VIT D DEFICIENCY, FRACTURES): Vit D, 25-Hydroxy: 33.2 ng/mL (ref 30.0–100.0)

## 2020-07-17 ENCOUNTER — Other Ambulatory Visit (INDEPENDENT_AMBULATORY_CARE_PROVIDER_SITE_OTHER): Payer: Self-pay | Admitting: Family Medicine

## 2020-07-17 DIAGNOSIS — E559 Vitamin D deficiency, unspecified: Secondary | ICD-10-CM

## 2020-07-18 NOTE — Progress Notes (Signed)
Chief Complaint:   OBESITY Luis Russell is here to discuss his progress with his obesity treatment plan along with follow-up of his obesity related diagnoses. Luis Russell is on the Category 4 Plan and states he is following his eating plan approximately 0% of the time. Luis Russell states he is doing 0 minutes 0 times per week.  Today's visit was #: 7 Starting weight: 342 lbs Starting date: 03/24/2020 Today's weight: 322 lbs Today's date: 07/13/2020 Total lbs lost to date: 20 Total lbs lost since last in-office visit: 0  Interim History: Luis Russell has done well maintaining his weight but he is frustrated that his weight loss has stalled. He states he is getting a lot less family support and he feels like there are a lot of temptations around the house.  Subjective:   1. Type 2 diabetes mellitus with other specified complication, without long-term current use of insulin (HCC) Luis Russell's last A1c was elevated at 7.0. He did not bring his BGs log today. He is working on diet and weight loss. He still notes polyphagia on Rybelsus. I discussed labs with the patient today.  2. Vitamin D deficiency Luis Russell's Vit D level is not at goal. He is on Vit D prescription, and he is due for labs. I discussed labs with the patient today.  3. Other hyperlipidemia Luis Russell is working on diet, exercise, and weight loss. He is due to have labs checked.  4. Other depression, with emotional eating Luis Russell is stable on his medications, but he is getting less support and increased temptations at home.  5. At risk for anxiety Luis Russell is at risk of developing anxiety due to stress, personal and or family history or current situation.  Assessment/Plan:   1. Type 2 diabetes mellitus with other specified complication, without long-term current use of insulin (HCC) Good blood sugar control is important to decrease the likelihood of diabetic complications such as nephropathy, neuropathy, limb loss, blindness, coronary artery disease,  and death. Intensive lifestyle modification including diet, exercise and weight loss are the first line of treatment for diabetes. We will check labs today. Luis Russell agreed to increase Rybelsus to 7 mg q daily with no refills.   - Comprehensive metabolic panel - Hemoglobin A1c - Insulin, random - Semaglutide (RYBELSUS) 7 MG TABS; Take 1 tablet by mouth daily.  Dispense: 30 tablet; Refill: 0  2. Vitamin D deficiency Low Vitamin D level contributes to fatigue and are associated with obesity, breast, and colon cancer. We will check labs today, and we will prescription Vitamin D for 1 month. Luis Russell will follow-up for routine testing of Vitamin D, at least 2-3 times per year to avoid over-replacement.  - VITAMIN D 25 Hydroxy (Vit-D Deficiency, Fractures) - Vitamin D, Ergocalciferol, (DRISDOL) 1.25 MG (50000 UNIT) CAPS capsule; Take 1 capsule (50,000 Units total) by mouth every 7 (seven) days.  Dispense: 4 capsule; Refill: 0  3. Other hyperlipidemia Cardiovascular risk and specific lipid/LDL goals reviewed. We discussed several lifestyle modifications today. Luis Russell will continue to work on diet, exercise and weight loss efforts. We will check labs today. Orders and follow up as documented in patient record.   - Lipid Panel With LDL/HDL Ratio  4. Other depression, with emotional eating Behavior modification techniques were discussed today to help Luis Russell deal with his emotional/non-hunger eating behaviors. We will refill Wellbutrin SR for 1 month. Orders and follow up as documented in patient record.   - buPROPion (WELLBUTRIN SR) 150 MG 12 hr tablet; Take 1 tablet (150 mg  total) by mouth daily. Take 3 tablets by mouth every morning  Dispense: 90 tablet; Refill: 0  5. At risk for anxiety Luis Russell was given approximately 15 minutes of anxiety risk counseling today. He has risk factors for anxiety. We discussed the importance of a healthy work life balance, a healthy relationship with food and a good  support system.  Repetitive spaced learning was employed today to elicit superior memory formation and behavioral change.  6. Class 3 severe obesity with serious comorbidity and body mass index (BMI) of 40.0 to 44.9 in adult, unspecified obesity type (HCC) Luis Russell is currently in the action stage of change. As such, his goal is to continue with weight loss efforts. He has agreed to the Category 4 Plan.   Exercise goals: Luis Russell is to add strengthening exercise.  Behavioral modification strategies: dealing with family or coworker sabotage.  Luis Russell has agreed to follow-up with our clinic in 2 weeks. He was informed of the importance of frequent follow-up visits to maximize his success with intensive lifestyle modifications for his multiple health conditions.   Luis Russell was informed we would discuss his lab results at his next visit unless there is a critical issue that needs to be addressed sooner. Luis Russell agreed to keep his next visit at the agreed upon time to discuss these results.  Objective:   Blood pressure 108/68, pulse 83, temperature 98.1 F (36.7 C), height 6\' 2"  (1.88 m), weight (!) 322 lb (146.1 kg), SpO2 95 %. Body mass index is 41.34 kg/m.  General: Cooperative, alert, well developed, in no acute distress. HEENT: Conjunctivae and lids unremarkable. Cardiovascular: Regular rhythm.  Lungs: Normal work of breathing. Neurologic: No focal deficits.   Lab Results  Component Value Date   CREATININE 0.94 07/13/2020   BUN 16 07/13/2020   NA 139 07/13/2020   K 4.3 07/13/2020   CL 102 07/13/2020   CO2 25 07/13/2020   Lab Results  Component Value Date   ALT 30 07/13/2020   AST 22 07/13/2020   ALKPHOS 81 07/13/2020   BILITOT 0.5 07/13/2020   Lab Results  Component Value Date   HGBA1C 5.7 (H) 07/13/2020   HGBA1C 7.0 (H) 03/24/2020   HGBA1C 5.8 (H) 06/08/2016   Lab Results  Component Value Date   INSULIN 22.1 07/13/2020   INSULIN 33.8 (H) 03/24/2020   Lab Results    Component Value Date   TSH 3.240 03/24/2020   Lab Results  Component Value Date   CHOL 217 (H) 07/13/2020   HDL 44 07/13/2020   LDLCALC 155 (H) 07/13/2020   TRIG 98 07/13/2020   Lab Results  Component Value Date   WBC 7.4 03/24/2020   HGB 13.4 03/24/2020   HCT 41.1 03/24/2020   MCV 90 03/24/2020   PLT 210 03/24/2020   No results found for: IRON, TIBC, FERRITIN  Attestation Statements:   Reviewed by clinician on day of visit: allergies, medications, problem list, medical history, surgical history, family history, social history, and previous encounter notes.   I, 03/26/2020, am acting as transcriptionist for Burt Knack, MD.  I have reviewed the above documentation for accuracy and completeness, and I agree with the above. -  Luis Quince, MD

## 2020-07-27 ENCOUNTER — Other Ambulatory Visit: Payer: Self-pay

## 2020-07-27 ENCOUNTER — Encounter (INDEPENDENT_AMBULATORY_CARE_PROVIDER_SITE_OTHER): Payer: Self-pay | Admitting: Family Medicine

## 2020-07-27 ENCOUNTER — Ambulatory Visit (INDEPENDENT_AMBULATORY_CARE_PROVIDER_SITE_OTHER): Payer: BC Managed Care – PPO | Admitting: Family Medicine

## 2020-07-27 VITALS — BP 90/62 | HR 91 | Temp 98.1°F | Ht 74.0 in | Wt 320.0 lb

## 2020-07-27 DIAGNOSIS — E559 Vitamin D deficiency, unspecified: Secondary | ICD-10-CM | POA: Diagnosis not present

## 2020-07-27 DIAGNOSIS — E1169 Type 2 diabetes mellitus with other specified complication: Secondary | ICD-10-CM | POA: Diagnosis not present

## 2020-07-27 DIAGNOSIS — K760 Fatty (change of) liver, not elsewhere classified: Secondary | ICD-10-CM

## 2020-07-27 DIAGNOSIS — Z6841 Body Mass Index (BMI) 40.0 and over, adult: Secondary | ICD-10-CM | POA: Diagnosis not present

## 2020-07-27 NOTE — Progress Notes (Signed)
Chief Complaint:   OBESITY Luis Russell is here to discuss his progress with his obesity treatment plan along with follow-up of his obesity related diagnoses. Luis Russell is on the Category 4 Plan and states he is following his eating plan approximately 80-85% of the time. Luis Russell states he is walking the dog for 10 minutes 3-4 times per week.  Today's visit was #: 8 Starting weight: 342 lbs Starting date: 03/24/2020 Today's weight: 320 lbs Today's date: 07/27/2020 Total lbs lost to date: 22 Total lbs lost since last in-office visit: 2  Interim History: Luis Russell continues to do well with weight loss on his plan. He is especially getting bored with breakfast and would like to look at additional options.  Subjective:   1. Type 2 diabetes mellitus with other specified complication, without long-term current use of insulin (HCC) Luis Russell's A1c has greatly improved with diet and weight loss, and medications. He denies hypoglycemia. I discussed labs with the patient today.  2. Vitamin D deficiency Luis Russell's Vit D level has improved on Vit D prescription, but his level is not yet at goal. I discussed labs with the patient today.  3. NAFLD (nonalcoholic fatty liver disease) Luis Russell's ALT has decreased to the normal range with weight loss. He denies abdominal pain or jaundice. I discussed labs with the patient today.  Assessment/Plan:   1. Type 2 diabetes mellitus with other specified complication, without long-term current use of insulin (HCC) Good blood sugar control is important to decrease the likelihood of diabetic complications such as nephropathy, neuropathy, limb loss, blindness, coronary artery disease, and death. Intensive lifestyle modification including diet, exercise and weight loss are the first line of treatment for diabetes. Luis Russell will continue with diet, exercise, and weight los, and will follow up as directed.  2. Vitamin D deficiency Low Vitamin D level contributes to fatigue and are  associated with obesity, breast, and colon cancer. Luis Russell agreed to continue taking prescription Vitamin D 50,000 IU every week and will follow-up for routine testing of Vitamin D, at least 2-3 times per year to avoid over-replacement.  3. NAFLD (nonalcoholic fatty liver disease) We discussed the likely diagnosis of non-alcoholic fatty liver disease today and how this condition is obesity related. Luis Russell was educated the importance of weight loss. Luis Russell agreed to continue with his weight loss efforts with healthier diet and exercise as an essential part of his treatment plan.  4. Class 3 severe obesity with serious comorbidity and body mass index (BMI) of 40.0 to 44.9 in adult, unspecified obesity type (HCC) Luis Russell is currently in the action stage of change. As such, his goal is to continue with weight loss efforts. He has agreed to the Category 4 Plan and keeping a food journal and adhering to recommended goals of 300-450 calories and 30+ grams of protein at breakfast daily.   Exercise goals: As is.  Behavioral modification strategies: increasing lean protein intake and keeping a strict food journal.  Luis Russell has agreed to follow-up with our clinic in 2 to 3 weeks. He was informed of the importance of frequent follow-up visits to maximize his success with intensive lifestyle modifications for his multiple health conditions.   Objective:   Blood pressure 90/62, pulse 91, temperature 98.1 F (36.7 C), height 6\' 2"  (1.88 m), weight (!) 320 lb (145.2 kg), SpO2 95 %. Body mass index is 41.09 kg/m.  General: Cooperative, alert, well developed, in no acute distress. HEENT: Conjunctivae and lids unremarkable. Cardiovascular: Regular rhythm.  Lungs: Normal work of  breathing. Neurologic: No focal deficits.   Lab Results  Component Value Date   CREATININE 0.94 07/13/2020   BUN 16 07/13/2020   NA 139 07/13/2020   K 4.3 07/13/2020   CL 102 07/13/2020   CO2 25 07/13/2020   Lab Results    Component Value Date   ALT 30 07/13/2020   AST 22 07/13/2020   ALKPHOS 81 07/13/2020   BILITOT 0.5 07/13/2020   Lab Results  Component Value Date   HGBA1C 5.7 (H) 07/13/2020   HGBA1C 7.0 (H) 03/24/2020   HGBA1C 5.8 (H) 06/08/2016   Lab Results  Component Value Date   INSULIN 22.1 07/13/2020   INSULIN 33.8 (H) 03/24/2020   Lab Results  Component Value Date   TSH 3.240 03/24/2020   Lab Results  Component Value Date   CHOL 217 (H) 07/13/2020   HDL 44 07/13/2020   LDLCALC 155 (H) 07/13/2020   TRIG 98 07/13/2020   Lab Results  Component Value Date   WBC 7.4 03/24/2020   HGB 13.4 03/24/2020   HCT 41.1 03/24/2020   MCV 90 03/24/2020   PLT 210 03/24/2020   No results found for: IRON, TIBC, FERRITIN  Attestation Statements:   Reviewed by clinician on day of visit: allergies, medications, problem list, medical history, surgical history, family history, social history, and previous encounter notes.  Time spent on visit including pre-visit chart review and post-visit care and charting was 32 minutes.    I, Burt Knack, am acting as transcriptionist for [name], MD.  I have reviewed the above documentation for accuracy and completeness, and I agree with the above. -  [name], MD

## 2020-08-04 ENCOUNTER — Other Ambulatory Visit (INDEPENDENT_AMBULATORY_CARE_PROVIDER_SITE_OTHER): Payer: Self-pay | Admitting: Family Medicine

## 2020-08-04 DIAGNOSIS — E559 Vitamin D deficiency, unspecified: Secondary | ICD-10-CM

## 2020-08-06 ENCOUNTER — Other Ambulatory Visit (INDEPENDENT_AMBULATORY_CARE_PROVIDER_SITE_OTHER): Payer: Self-pay | Admitting: Family Medicine

## 2020-08-06 DIAGNOSIS — E1169 Type 2 diabetes mellitus with other specified complication: Secondary | ICD-10-CM

## 2020-08-08 ENCOUNTER — Encounter (INDEPENDENT_AMBULATORY_CARE_PROVIDER_SITE_OTHER): Payer: Self-pay | Admitting: Family Medicine

## 2020-08-15 ENCOUNTER — Other Ambulatory Visit: Payer: Self-pay

## 2020-08-15 ENCOUNTER — Encounter (INDEPENDENT_AMBULATORY_CARE_PROVIDER_SITE_OTHER): Payer: Self-pay | Admitting: Adult Health

## 2020-08-15 ENCOUNTER — Ambulatory Visit (INDEPENDENT_AMBULATORY_CARE_PROVIDER_SITE_OTHER): Payer: BC Managed Care – PPO | Admitting: Adult Health

## 2020-08-15 VITALS — BP 96/63 | HR 75 | Temp 98.0°F | Ht 74.0 in | Wt 315.0 lb

## 2020-08-15 DIAGNOSIS — Z9189 Other specified personal risk factors, not elsewhere classified: Secondary | ICD-10-CM

## 2020-08-15 DIAGNOSIS — E1169 Type 2 diabetes mellitus with other specified complication: Secondary | ICD-10-CM | POA: Diagnosis not present

## 2020-08-15 DIAGNOSIS — Z6841 Body Mass Index (BMI) 40.0 and over, adult: Secondary | ICD-10-CM | POA: Diagnosis not present

## 2020-08-15 DIAGNOSIS — E559 Vitamin D deficiency, unspecified: Secondary | ICD-10-CM

## 2020-08-15 MED ORDER — VITAMIN D (ERGOCALCIFEROL) 1.25 MG (50000 UNIT) PO CAPS: 50000.0000 [IU] | ORAL_CAPSULE | ORAL | 0 refills | Status: DC

## 2020-08-16 ENCOUNTER — Encounter (INDEPENDENT_AMBULATORY_CARE_PROVIDER_SITE_OTHER): Payer: Self-pay | Admitting: Adult Health

## 2020-08-16 DIAGNOSIS — E119 Type 2 diabetes mellitus without complications: Secondary | ICD-10-CM | POA: Insufficient documentation

## 2020-08-16 DIAGNOSIS — Z6841 Body Mass Index (BMI) 40.0 and over, adult: Secondary | ICD-10-CM | POA: Insufficient documentation

## 2020-08-16 DIAGNOSIS — E559 Vitamin D deficiency, unspecified: Secondary | ICD-10-CM | POA: Insufficient documentation

## 2020-08-16 NOTE — Progress Notes (Signed)
Chief Complaint:   OBESITY Luis Russell is here to discuss his progress with his obesity treatment plan along with follow-up of his obesity related diagnoses. Luis Russell is on the Category 4 Plan and journaling and states he is following his eating plan approximately 75-80% of the time. Luis Russell states he is walking the dog for exercise.   Today's visit was #: 9 Starting weight: 342 lbs Starting date: 03/24/2020 Today's weight: 315 lbs Today's date: 08/15/2020 Total lbs lost to date: 27 Total lbs lost since last in-office visit: 5  Interim History: Luis Russell has been taking Rybelsus 7 mg at breakfast and reports nausea has subsided. He reports increased energy levels and "feeling thinner" since starting the program. He will track all food each day and estimates to consume ~1700  Calories a day. Blood pressure is a little soft today. He denies symptoms of hypotension and is not on antihypertensive therapy.  Subjective:   Vitamin D deficiency. Last Vitamin D level on 07/13/2020 was 33.2, which is below goal of 50. Luis Russell is on Ergocalciferol. No nausea, vomiting, or muscle weakness.    Ref. Range 07/13/2020 07:33  Vitamin D, 25-Hydroxy Latest Ref Range: 30.0 - 100.0 ng/mL 33.2   Type 2 diabetes mellitus with other specified complication, without long-term current use of insulin (HCC). 07/13/2020 A1c 5.7 with an insulin level of 22.1 - both greatly improved from 02/2020 check. Luis Russell is on Rybelsus 7 mg daily. After dosage was increased on 07/13/2020, he experienced nausea the first 2 weeks but now reports GI upset is resolved. He has never been on metformin for diabetes. He does not check his blood glucose levels at home and denies symptoms of hypoglycemia.  Lab Results  Component Value Date   HGBA1C 5.7 (H) 07/13/2020   HGBA1C 7.0 (H) 03/24/2020   HGBA1C 5.8 (H) 06/08/2016   Lab Results  Component Value Date   LDLCALC 155 (H) 07/13/2020   CREATININE 0.94 07/13/2020   Lab  Results  Component Value Date   INSULIN 22.1 07/13/2020   INSULIN 33.8 (H) 03/24/2020   At risk for complication associated with hypotension. The patient is at a higher than average risk of hypotension due to continued weight loss, is not on antihypertensive.  Assessment/Plan:   Vitamin D deficiency. Low Vitamin D level contributes to fatigue and are associated with obesity, breast, and colon cancer. He was given a refill on his Vitamin D, Ergocalciferol, (DRISDOL) 1.25 MG (50000 UNIT) CAPS capsule every week #4 with 0 refills and will follow-up for routine testing of Vitamin D, at least 2-3 times per year to avoid over-replacement.   Type 2 diabetes mellitus with other specified complication, without long-term current use of insulin (HCC). Good blood sugar control is important to decrease the likelihood of diabetic complications such as nephropathy, neuropathy, limb loss, blindness, coronary artery disease, and death. Intensive lifestyle modification including diet, exercise and weight loss are the first line of treatment for diabetes. Luis Russell will continue healthy eating and continue Rybelsus 7 mg daily as directed.   At risk for complication associated with hypotension. Luis Russell was given approximately 15 minutes of education and counseling today to help avoid hypotension. We discussed risks of hypotension with weight loss and signs of hypotension such as feeling lightheaded or unsteady.  Repetitive spaced learning was employed today to elicit superior memory formation and behavioral change.  Class 3 severe obesity with serious comorbidity and body mass index (BMI) of 40.0 to 44.9 in adult, unspecified obesity type (  HCC).  Luis Russell is currently in the action stage of change. As such, his goal is to continue with weight loss efforts. He has agreed to the Category 4 Plan and will journal 300-400 calories and 30 grams of protein at breakfast.   He will work on increasing his water intake.  Exercise  goals: Luis Russell will continue walking the dog for exercise.  Behavioral modification strategies: increasing lean protein intake, increasing water intake, meal planning and cooking strategies and planning for success.  Luis Russell has agreed to follow-up with our clinic in 2 weeks. He was informed of the importance of frequent follow-up visits to maximize his success with intensive lifestyle modifications for his multiple health conditions.   Objective:   Blood pressure 96/63, pulse 75, temperature 98 F (36.7 C), height 6\' 2"  (1.88 m), weight (!) 315 lb (142.9 kg), SpO2 96 %. Body mass index is 40.44 kg/m.  General: Cooperative, alert, well developed, in no acute distress. HEENT: Conjunctivae and lids unremarkable. Cardiovascular: Regular rhythm.  Lungs: Normal work of breathing. Neurologic: No focal deficits.   Lab Results  Component Value Date   CREATININE 0.94 07/13/2020   BUN 16 07/13/2020   NA 139 07/13/2020   K 4.3 07/13/2020   CL 102 07/13/2020   CO2 25 07/13/2020   Lab Results  Component Value Date   ALT 30 07/13/2020   AST 22 07/13/2020   ALKPHOS 81 07/13/2020   BILITOT 0.5 07/13/2020   Lab Results  Component Value Date   HGBA1C 5.7 (H) 07/13/2020   HGBA1C 7.0 (H) 03/24/2020   HGBA1C 5.8 (H) 06/08/2016   Lab Results  Component Value Date   INSULIN 22.1 07/13/2020   INSULIN 33.8 (H) 03/24/2020   Lab Results  Component Value Date   TSH 3.240 03/24/2020   Lab Results  Component Value Date   CHOL 217 (H) 07/13/2020   HDL 44 07/13/2020   LDLCALC 155 (H) 07/13/2020   TRIG 98 07/13/2020   Lab Results  Component Value Date   WBC 7.4 03/24/2020   HGB 13.4 03/24/2020   HCT 41.1 03/24/2020   MCV 90 03/24/2020   PLT 210 03/24/2020   No results found for: IRON, TIBC, FERRITIN  Attestation Statements:   Reviewed by clinician on day of visit: allergies, medications, problem list, medical history, surgical history, family history, social history, and previous  encounter notes.  I, 03/26/2020, am acting as [employer] for [employer] manager, NP-C   I have reviewed the above documentation for accuracy and completeness, and I agree with the above. -  Jurney Overacker d. Talan Gildner, NP-C

## 2020-08-18 ENCOUNTER — Encounter (INDEPENDENT_AMBULATORY_CARE_PROVIDER_SITE_OTHER): Payer: Self-pay | Admitting: Family Medicine

## 2020-08-18 DIAGNOSIS — E1169 Type 2 diabetes mellitus with other specified complication: Secondary | ICD-10-CM

## 2020-08-22 NOTE — Telephone Encounter (Signed)
Refill protocol sent to provider 

## 2020-08-23 MED ORDER — RYBELSUS 7 MG PO TABS: 1.0000 | ORAL_TABLET | Freq: Every day | ORAL | 0 refills | Status: DC

## 2020-08-29 ENCOUNTER — Other Ambulatory Visit: Payer: Self-pay

## 2020-08-29 ENCOUNTER — Encounter (INDEPENDENT_AMBULATORY_CARE_PROVIDER_SITE_OTHER): Payer: Self-pay | Admitting: Family Medicine

## 2020-08-29 ENCOUNTER — Ambulatory Visit (INDEPENDENT_AMBULATORY_CARE_PROVIDER_SITE_OTHER): Payer: BC Managed Care – PPO | Admitting: Family Medicine

## 2020-08-29 VITALS — BP 113/69 | HR 78 | Temp 98.2°F | Ht 74.0 in | Wt 316.0 lb

## 2020-08-29 DIAGNOSIS — Z6841 Body Mass Index (BMI) 40.0 and over, adult: Secondary | ICD-10-CM

## 2020-08-29 DIAGNOSIS — E1169 Type 2 diabetes mellitus with other specified complication: Secondary | ICD-10-CM | POA: Diagnosis not present

## 2020-08-29 NOTE — Progress Notes (Signed)
Chief Complaint:   OBESITY [place] is here to discuss his progress with his obesity treatment plan along with follow-up of his obesity related diagnoses. Marquize is on the Category 4 Plan and keeping a food journal and adhering to recommended goals of 300-400 calories and 30 grams of protein at breakfast daily and states he is following his eating plan approximately 70% of the time. Zaine states he is walking the dog for 10-15 minutes 7 times per week.  Today's visit was #: 10 Starting weight: 342 lbs Starting date: 03/24/2020 Today's weight: 316 lbs Today's date: 08/29/2020 Total lbs lost to date: 26 Total lbs lost since last in-office visit: 0  Interim History: Jakoby notes temptations at home especially for dinner. He notes decreased family supports and his protein is decreasing again. He is up 1 lb but this appears to be die to increased water weight.  Subjective:   1. Type 2 diabetes mellitus with other specified complication, without long-term current use of insulin (HCC) Cody notes nausea with Rybelsus, and he has been taking it with food which the bio availability.  Assessment/Plan:   1. Type 2 diabetes mellitus with other specified complication, without long-term current use of insulin (HCC) Good blood sugar control is important to decrease the likelihood of diabetic complications such as nephropathy, neuropathy, limb loss, blindness, coronary artery disease, and death. Intensive lifestyle modification including diet, exercise and weight loss are the first line of treatment for diabetes. Ana agreed to continue Rybelsus, and take in the morning on an empty stomach for full absorption. Will continue to monitor nausea.  2. Class 3 severe obesity with serious comorbidity and body mass index (BMI) of 40.0 to 44.9 in adult, unspecified obesity type (HCC) Makell is currently in the action stage of change. As such, his goal is to continue with weight loss efforts. He has agreed  to the Category 4 Plan.   Exercise goals: As is.  Behavioral modification strategies: dealing with family or coworker sabotage.  Kimm has agreed to follow-up with our clinic in 2 to 3 weeks. He was informed of the importance of frequent follow-up visits to maximize his success with intensive lifestyle modifications for his multiple health conditions.   Objective:   Blood pressure 113/69, pulse 78, temperature 98.2 F (36.8 C), height 6\' 2"  (1.88 m), weight (!) 316 lb (143.3 kg), SpO2 97 %. Body mass index is 40.57 kg/m.  General: Cooperative, alert, well developed, in no acute distress. HEENT: Conjunctivae and lids unremarkable. Cardiovascular: Regular rhythm.  Lungs: Normal work of breathing. Neurologic: No focal deficits.   Lab Results  Component Value Date   CREATININE 0.94 07/13/2020   BUN 16 07/13/2020   NA 139 07/13/2020   K 4.3 07/13/2020   CL 102 07/13/2020   CO2 25 07/13/2020   Lab Results  Component Value Date   ALT 30 07/13/2020   AST 22 07/13/2020   ALKPHOS 81 07/13/2020   BILITOT 0.5 07/13/2020   Lab Results  Component Value Date   HGBA1C 5.7 (H) 07/13/2020   HGBA1C 7.0 (H) 03/24/2020   HGBA1C 5.8 (H) 06/08/2016   Lab Results  Component Value Date   INSULIN 22.1 07/13/2020   INSULIN 33.8 (H) 03/24/2020   Lab Results  Component Value Date   TSH 3.240 03/24/2020   Lab Results  Component Value Date   CHOL 217 (H) 07/13/2020   HDL 44 07/13/2020   LDLCALC 155 (H) 07/13/2020   TRIG 98 07/13/2020  Lab Results  Component Value Date   WBC 7.4 03/24/2020   HGB 13.4 03/24/2020   HCT 41.1 03/24/2020   MCV 90 03/24/2020   PLT 210 03/24/2020   No results found for: IRON, TIBC, FERRITIN  Attestation Statements:   Reviewed by clinician on day of visit: allergies, medications, problem list, medical history, surgical history, family history, social history, and previous encounter notes.  Time spent on visit including pre-visit chart review and  post-visit care and charting was 30 minutes.    I, Burt Knack, am acting as transcriptionist for [name], MD.  I have reviewed the above documentation for accuracy and completeness, and I agree with the above. -  [name], MD

## 2020-09-12 ENCOUNTER — Other Ambulatory Visit: Payer: Self-pay

## 2020-09-12 ENCOUNTER — Ambulatory Visit (INDEPENDENT_AMBULATORY_CARE_PROVIDER_SITE_OTHER): Payer: BC Managed Care – PPO | Admitting: Family Medicine

## 2020-09-12 ENCOUNTER — Encounter (INDEPENDENT_AMBULATORY_CARE_PROVIDER_SITE_OTHER): Payer: Self-pay | Admitting: Family Medicine

## 2020-09-12 ENCOUNTER — Other Ambulatory Visit (INDEPENDENT_AMBULATORY_CARE_PROVIDER_SITE_OTHER): Payer: Self-pay | Admitting: Adult Health

## 2020-09-12 VITALS — BP 109/72 | HR 76 | Temp 98.1°F | Ht 74.0 in | Wt 311.0 lb

## 2020-09-12 DIAGNOSIS — Z6841 Body Mass Index (BMI) 40.0 and over, adult: Secondary | ICD-10-CM

## 2020-09-12 DIAGNOSIS — G4709 Other insomnia: Secondary | ICD-10-CM

## 2020-09-12 DIAGNOSIS — E559 Vitamin D deficiency, unspecified: Secondary | ICD-10-CM

## 2020-09-13 NOTE — Progress Notes (Signed)
Chief Complaint:   OBESITY Luis Russell is here to discuss his progress with his obesity treatment plan along with follow-up of his obesity related diagnoses. Luis Russell is on the Category 4 Plan and states he is following his eating plan approximately 70% of the time. Luis Russell states he is walking the dog for 10 minutes 5 times per week.  Today's visit was #: 11 Starting weight: 342 lbs Starting date: 03/24/2020 Today's weight: 311 lbs Today's date: 09/12/2020 Total lbs lost to date: 31 Total lbs lost since last in-office visit: 5  Interim History: Luis Russell continues to do well with weight loss. He still struggles with following his dinner meal as the family often wants to eat out, and he isn't as hungry with his Rybelsus. He is open to discussing holiday eating strategies.  Subjective:   1. Other insomnia Luis Russell states he sleeps 5-6 hours per night and can't fall back to sleep after his wife gets up. He takes trazodone 100 mg qhs and it helps him, but often leaves him groggy in the morning.  Assessment/Plan:   1. Other insomnia The problem of recurrent insomnia was discussed. Orders and follow up as documented in patient record. Counseling: Intensive lifestyle modifications are the first line treatment for this issue. We discussed several lifestyle modifications today. Luis Russell was educated on the importance of 7-8 hours of quality sleep per night, and how less than 6 hours per night can decrease his RMR. He will continue to work on diet, exercise and weight loss efforts.   2. Class 3 severe obesity with serious comorbidity and body mass index (BMI) of 40.0 to 44.9 in adult, unspecified obesity type (HCC) Luis Russell is currently in the action stage of change. As such, his goal is to continue with weight loss efforts. He has agreed to the Category 4 Plan.    Exercise goals: As is, and start strengthening exercises.  Behavioral modification strategies: holiday eating strategies .  Luis Russell has  agreed to follow-up with our clinic in 3 weeks. He was informed of the importance of frequent follow-up visits to maximize his success with intensive lifestyle modifications for his multiple health conditions.   Objective:   Blood pressure 109/72, pulse 76, temperature 98.1 F (36.7 C), height 6\' 2"  (1.88 m), weight (!) 311 lb (141.1 kg), SpO2 98 %. Body mass index is 39.93 kg/m.  General: Cooperative, alert, well developed, in no acute distress. HEENT: Conjunctivae and lids unremarkable. Cardiovascular: Regular rhythm.  Lungs: Normal work of breathing. Neurologic: No focal deficits.   Lab Results  Component Value Date   CREATININE 0.94 07/13/2020   BUN 16 07/13/2020   NA 139 07/13/2020   K 4.3 07/13/2020   CL 102 07/13/2020   CO2 25 07/13/2020   Lab Results  Component Value Date   ALT 30 07/13/2020   AST 22 07/13/2020   ALKPHOS 81 07/13/2020   BILITOT 0.5 07/13/2020   Lab Results  Component Value Date   HGBA1C 5.7 (H) 07/13/2020   HGBA1C 7.0 (H) 03/24/2020   HGBA1C 5.8 (H) 06/08/2016   Lab Results  Component Value Date   INSULIN 22.1 07/13/2020   INSULIN 33.8 (H) 03/24/2020   Lab Results  Component Value Date   TSH 3.240 03/24/2020   Lab Results  Component Value Date   CHOL 217 (H) 07/13/2020   HDL 44 07/13/2020   LDLCALC 155 (H) 07/13/2020   TRIG 98 07/13/2020   Lab Results  Component Value Date   WBC 7.4 03/24/2020  HGB 13.4 03/24/2020   HCT 41.1 03/24/2020   MCV 90 03/24/2020   PLT 210 03/24/2020   No results found for: IRON, TIBC, FERRITIN  Attestation Statements:   Reviewed by clinician on day of visit: allergies, medications, problem list, medical history, surgical history, family history, social history, and previous encounter notes.  Time spent on visit including pre-visit chart review and post-visit care and charting was 30 minutes.    I, Burt Knack, am acting as transcriptionist for [name], MD.  I have reviewed the above  documentation for accuracy and completeness, and I agree with the above. -  [name], MD

## 2020-09-14 ENCOUNTER — Other Ambulatory Visit (INDEPENDENT_AMBULATORY_CARE_PROVIDER_SITE_OTHER): Payer: Self-pay | Admitting: Adult Health

## 2020-09-14 DIAGNOSIS — E1169 Type 2 diabetes mellitus with other specified complication: Secondary | ICD-10-CM

## 2020-09-19 ENCOUNTER — Encounter (INDEPENDENT_AMBULATORY_CARE_PROVIDER_SITE_OTHER): Payer: Self-pay | Admitting: Family Medicine

## 2020-09-29 ENCOUNTER — Other Ambulatory Visit (INDEPENDENT_AMBULATORY_CARE_PROVIDER_SITE_OTHER): Payer: Self-pay

## 2020-09-29 ENCOUNTER — Encounter (INDEPENDENT_AMBULATORY_CARE_PROVIDER_SITE_OTHER): Payer: Self-pay | Admitting: Family Medicine

## 2020-09-29 DIAGNOSIS — E559 Vitamin D deficiency, unspecified: Secondary | ICD-10-CM

## 2020-09-29 DIAGNOSIS — E1169 Type 2 diabetes mellitus with other specified complication: Secondary | ICD-10-CM

## 2020-09-29 MED ORDER — RYBELSUS 7 MG PO TABS: 1.0000 | ORAL_TABLET | Freq: Every day | ORAL | 0 refills | Status: DC

## 2020-09-29 MED ORDER — VITAMIN D (ERGOCALCIFEROL) 1.25 MG (50000 UNIT) PO CAPS: 50000.0000 [IU] | ORAL_CAPSULE | ORAL | 0 refills | Status: DC

## 2020-09-29 NOTE — Telephone Encounter (Signed)
Dr Beasley pt 

## 2020-10-03 ENCOUNTER — Encounter (INDEPENDENT_AMBULATORY_CARE_PROVIDER_SITE_OTHER): Payer: Self-pay | Admitting: Family Medicine

## 2020-10-03 ENCOUNTER — Other Ambulatory Visit: Payer: Self-pay

## 2020-10-03 ENCOUNTER — Ambulatory Visit (INDEPENDENT_AMBULATORY_CARE_PROVIDER_SITE_OTHER): Payer: BC Managed Care – PPO | Admitting: Family Medicine

## 2020-10-03 VITALS — BP 99/64 | HR 67 | Temp 98.0°F | Ht 74.0 in | Wt 315.0 lb

## 2020-10-03 DIAGNOSIS — Z6841 Body Mass Index (BMI) 40.0 and over, adult: Secondary | ICD-10-CM | POA: Diagnosis not present

## 2020-10-03 DIAGNOSIS — Z9189 Other specified personal risk factors, not elsewhere classified: Secondary | ICD-10-CM

## 2020-10-03 DIAGNOSIS — E1169 Type 2 diabetes mellitus with other specified complication: Secondary | ICD-10-CM | POA: Diagnosis not present

## 2020-10-03 DIAGNOSIS — E559 Vitamin D deficiency, unspecified: Secondary | ICD-10-CM

## 2020-10-03 MED ORDER — RYBELSUS 7 MG PO TABS: 1.0000 | ORAL_TABLET | Freq: Every day | ORAL | 0 refills | Status: DC

## 2020-10-03 MED ORDER — VITAMIN D (ERGOCALCIFEROL) 1.25 MG (50000 UNIT) PO CAPS: 50000.0000 [IU] | ORAL_CAPSULE | ORAL | 0 refills | Status: DC

## 2020-10-04 NOTE — Progress Notes (Signed)
Chief Complaint:   OBESITY Luis Russell is here to discuss his progress with his obesity treatment plan along with follow-up of his obesity related diagnoses. Luis Russell is on the Category 4 Plan Luis states he is following his eating plan approximately 70% of the time. Luis Russell states he is walking the dog for 10-15 minutes 6 times per week.  Today's visit was #: 12 Starting weight: 342 lbs Starting date: 03/24/2020 Today's weight: 315 lbs Today's date: 10/03/2020 Total lbs lost to date: 27 Total lbs lost since last in-office visit: 0  Interim History: Luis Russell did some celebration eating over Thanksgiving. He did some eating of leftovers Luis notes it is harder to get back on track. His wife is not eating healthy with him Luis there are more temptations in his home.  Subjective:   1. Type 2 diabetes mellitus with other specified complication, without long-term current use of insulin (HCC) Luis Russell notes taking Rybelsus sometimes after eating, which means he is not getting the full amount of medications. He did not bring his BGs log today but he notes increased polyphagia.  2. Vitamin D deficiency Luis Russell is stable on Vit D, Luis he denies nausea or vomiting. He requests a refill today.  3. At risk for hyperglycemia Luis Russell is at increased risk for hyperglycemia due to decreased Rybelsus intake.  Assessment/Plan:   1. Type 2 diabetes mellitus with other specified complication, without long-term current use of insulin (HCC) Luis Russell, Luis Russell, Luis Russell, Luis Russell, Luis Russell, Luis Russell. Intensive lifestyle modification including diet, exercise Luis weight Russell are the first line of treatment for diabetes. Luis Russell will continue his medications, Luis we will refill Rybelsus for 1 month.  - Semaglutide (RYBELSUS) 7 MG TABS; Take 1 tablet by mouth daily.  Dispense: 30 tablet; Refill: 0  2.  Vitamin D deficiency Low Vitamin D level contributes to fatigue Luis are associated with obesity, breast, Luis colon cancer. We will refill prescription Vitamin D for 1 month. Luis Russell will follow-up for routine testing of Vitamin D, at least 2-3 times per year to avoid over-replacement.  - Vitamin D, Ergocalciferol, (DRISDOL) 1.25 MG (50000 UNIT) CAPS capsule; Take 1 capsule (50,000 Units total) by mouth every 7 (seven) days.  Dispense: 4 capsule; Refill: 0  3. At risk for hyperglycemia Luis Russell was given approximately 15 minutes of counseling today regarding prevention of hyperglycemia. He was advised of hyperglycemia causes Luis the fact hyperglycemia is often asymptomatic. Luis Russell was instructed to avoid skipping meals, eat regular protein rich meals Luis schedule low calorie but protein rich snacks as needed.   Repetitive spaced learning was employed today to elicit superior memory formation Luis behavioral change  4. Class 3 severe obesity with serious comorbidity Luis body mass index (BMI) of 40.0 to 44.9 in adult, unspecified obesity type (HCC) Luis Russell is currently in the action stage of change. As such, his goal is to continue with weight Russell efforts. He has agreed to the Category 4 Plan.   Exercise goals: As is.  Behavioral modification strategies: increasing lean protein intake Luis meal planning Luis cooking strategies.  Luis Russell has agreed to follow-up with our clinic in 2 weeks. He was informed of the importance of frequent follow-up visits to maximize his success with intensive lifestyle modifications for his multiple health conditions.   Objective:   Blood pressure 99/64, pulse 67, temperature 98 F (36.7 C), height 6\' 2"  (1.88 m), weight )  315 lb (142.9 kg), SpO2 96 %. Body mass index is 40.44 kg/m.  General: Cooperative, alert, well developed, in no acute distress. HEENT: Conjunctivae Luis lids unremarkable. Cardiovascular: Regular rhythm.  Lungs: Normal work of  breathing. Neurologic: No focal deficits.   Lab Results  Component Value Date   CREATININE 0.94 07/13/2020   BUN 16 07/13/2020   NA 139 07/13/2020   K 4.3 07/13/2020   CL 102 07/13/2020   CO2 25 07/13/2020   Lab Results  Component Value Date   ALT 30 07/13/2020   AST 22 07/13/2020   ALKPHOS 81 07/13/2020   BILITOT 0.5 07/13/2020   Lab Results  Component Value Date   HGBA1C 5.7 (H) 07/13/2020   HGBA1C 7.0 (H) 03/24/2020   HGBA1C 5.8 (H) 06/08/2016   Lab Results  Component Value Date   INSULIN 22.1 07/13/2020   INSULIN 33.8 (H) 03/24/2020   Lab Results  Component Value Date   TSH 3.240 03/24/2020   Lab Results  Component Value Date   CHOL 217 (H) 07/13/2020   HDL 44 07/13/2020   LDLCALC 155 (H) 07/13/2020   TRIG 98 07/13/2020   Lab Results  Component Value Date   WBC 7.4 03/24/2020   HGB 13.4 03/24/2020   HCT 41.1 03/24/2020   MCV 90 03/24/2020   PLT 210 03/24/2020   No results found for: IRON, TIBC, FERRITIN  Attestation Statements:   Reviewed by clinician on day of visit: allergies, medications, problem list, medical history, surgical history, family history, social history, Luis previous encounter notes.   I, Burt Knack, am acting as transcriptionist for [name], MD.  I have reviewed the above documentation for accuracy Luis completeness, Luis I agree with the above. -  [name], MD

## 2020-10-09 ENCOUNTER — Other Ambulatory Visit (INDEPENDENT_AMBULATORY_CARE_PROVIDER_SITE_OTHER): Payer: Self-pay | Admitting: Adult Health

## 2020-10-09 DIAGNOSIS — E1169 Type 2 diabetes mellitus with other specified complication: Secondary | ICD-10-CM

## 2020-10-31 ENCOUNTER — Encounter (INDEPENDENT_AMBULATORY_CARE_PROVIDER_SITE_OTHER): Payer: Self-pay | Admitting: Family Medicine

## 2020-10-31 ENCOUNTER — Ambulatory Visit (INDEPENDENT_AMBULATORY_CARE_PROVIDER_SITE_OTHER): Payer: BC Managed Care – PPO | Admitting: Family Medicine

## 2020-10-31 ENCOUNTER — Other Ambulatory Visit: Payer: Self-pay

## 2020-10-31 VITALS — BP 103/66 | HR 86 | Temp 97.6°F | Ht 74.0 in | Wt 310.0 lb

## 2020-10-31 DIAGNOSIS — Z9229 Personal history of other drug therapy: Secondary | ICD-10-CM

## 2020-10-31 DIAGNOSIS — Z6839 Body mass index (BMI) 39.0-39.9, adult: Secondary | ICD-10-CM

## 2020-10-31 DIAGNOSIS — Z9189 Other specified personal risk factors, not elsewhere classified: Secondary | ICD-10-CM | POA: Diagnosis not present

## 2020-10-31 DIAGNOSIS — E1169 Type 2 diabetes mellitus with other specified complication: Secondary | ICD-10-CM | POA: Diagnosis not present

## 2020-10-31 MED ORDER — RYBELSUS 7 MG PO TABS
1.0000 | ORAL_TABLET | Freq: Every day | ORAL | 0 refills | Status: DC
Start: 2020-10-31 — End: 2020-11-21

## 2020-11-02 NOTE — Progress Notes (Signed)
Chief Complaint:   OBESITY Luis Russell is here to discuss his progress with his obesity treatment plan along with follow-up of his obesity related diagnoses. Luis Russell is on the Category 4 Plan and states he is following his eating plan approximately 50% of the time. Luis Russell states he is walking the dog for 10-15 minutes 7 times per week.  Today's visit was #: 13 Starting weight: 342 lbs Starting date: 03/24/2020 Today's weight: 310 lbs Today's date: 10/31/2020 Total lbs lost to date: 32 Total lbs lost since last in-office visit: 5  Interim History: Luis Russell has done well with weight loss even over the holidays. His wife has shingles so he has been taking care of her, but he is still doing well with meal planning for breakfast and lunch. His hunger is controlled and he notes decreased "pigging out".  Subjective:   1. Type 2 diabetes mellitus with other specified complication, without long-term current use of insulin (HCC) Luis Russell increased Rybelsus to 14 mg, but he has increased GI upset and diarrhea. He went back to 7 mg and he is doing better. He would like to stay on the lower dose for now.  2. COVID-19 vaccine series completed Luis Russell notes he has had his COVID-19 booster shot now.  3. At increased risk of exposure to COVID-19 virus Luis Russell is at higher risk of COVID-19 infection due to higher infection rates locally.  Assessment/Plan:   1. Type 2 diabetes mellitus with other specified complication, without long-term current use of insulin (HCC) Good blood sugar control is important to decrease the likelihood of diabetic complications such as nephropathy, neuropathy, limb loss, blindness, coronary artery disease, and death. Intensive lifestyle modification including diet, exercise and weight loss are the first line of treatment for diabetes. We will refill Rybelsus at 7 mg q daily for 1 month, Fails is ok to take 7 mg and 14 mg every other day if he would like).  - Semaglutide (RYBELSUS)  7 MG TABS; Take 1 tablet by mouth daily.  Dispense: 30 tablet; Refill: 0  2. COVID-19 vaccine series completed Luis Russell was educated on the increase in COVID-19 hospitalizations and increased in both Delta and Omicran variant. He is encouraged to continue to mask in public.  3. At increased risk of exposure to COVID-19 virus Luis Russell was given approximately 15 minutes of COVID prevention counseling today Counseling  COVID-19 is a respiratory infection that is caused by a virus. It can cause serious infections, such as pneumonia, acute respiratory distress syndrome, acute respiratory failure, or sepsis.  You are more likely to develop a serious illness if you are 57 years of age or older, have a weak immune system, live in a nursing home, have chronic disease, or have obesity.  Get vaccinated as soon as they are available to you.  For our most current information, please visit [email or web address]  Wash your hands often with soap and water for 20 seconds. If soap and water are not available, use alcohol-based hand sanitizer.  Wear a face mask. Make sure your mask covers your nose and mouth.  Maintain at least 6 feet distance from others when in public.  Get help right away if  You have trouble breathing, chest pain, confusion, or other concerning symptoms.  Repetitive spaced learning was employed today to elicit superior memory formation and behavioral change.  4. Class 2 severe obesity with serious comorbidity and body mass index (BMI) of 39.0 to 39.9 in adult, unspecified obesity type (HCC) Luis Russell is currently in  the action stage of change. As such, his goal is to continue with weight loss efforts. He has agreed to the Category 4 Plan.   Exercise goals: Add strengthening, exercise band, and handout given.  Behavioral modification strategies: increasing lean protein intake.  Luis Russell has agreed to follow-up with our clinic in 3 weeks. He was informed of the importance of frequent  follow-up visits to maximize his success with intensive lifestyle modifications for his multiple health conditions.   Objective:   Blood pressure 103/66, pulse 86, temperature 97.6 F (36.4 C), height 6\' 2"  (1.88 m), weight (!) 310 lb (140.6 kg), SpO2 94 %. Body mass index is 39.8 kg/m.  General: Cooperative, alert, well developed, in no acute distress. HEENT: Conjunctivae and lids unremarkable. Cardiovascular: Regular rhythm.  Lungs: Normal work of breathing. Neurologic: No focal deficits.   Lab Results  Component Value Date   CREATININE 0.94 07/13/2020   BUN 16 07/13/2020   NA 139 07/13/2020   K 4.3 07/13/2020   CL 102 07/13/2020   CO2 25 07/13/2020   Lab Results  Component Value Date   ALT 30 07/13/2020   AST 22 07/13/2020   ALKPHOS 81 07/13/2020   BILITOT 0.5 07/13/2020   Lab Results  Component Value Date   HGBA1C 5.7 (H) 07/13/2020   HGBA1C 7.0 (H) 03/24/2020   HGBA1C 5.8 (H) 06/08/2016   Lab Results  Component Value Date   INSULIN 22.1 07/13/2020   INSULIN 33.8 (H) 03/24/2020   Lab Results  Component Value Date   TSH 3.240 03/24/2020   Lab Results  Component Value Date   CHOL 217 (H) 07/13/2020   HDL 44 07/13/2020   LDLCALC 155 (H) 07/13/2020   TRIG 98 07/13/2020   Lab Results  Component Value Date   WBC 7.4 03/24/2020   HGB 13.4 03/24/2020   HCT 41.1 03/24/2020   MCV 90 03/24/2020   PLT 210 03/24/2020   No results found for: IRON, TIBC, FERRITIN  Attestation Statements:   Reviewed by clinician on day of visit: allergies, medications, problem list, medical history, surgical history, family history, social history, and previous encounter notes.   I, 03/26/2020, am acting as transcriptionist for Burt Knack, MD.  I have reviewed the above documentation for accuracy and completeness, and I agree with the above. -  [name], MD

## 2020-11-21 ENCOUNTER — Other Ambulatory Visit: Payer: Self-pay

## 2020-11-21 ENCOUNTER — Encounter (INDEPENDENT_AMBULATORY_CARE_PROVIDER_SITE_OTHER): Payer: Self-pay | Admitting: Family Medicine

## 2020-11-21 ENCOUNTER — Ambulatory Visit (INDEPENDENT_AMBULATORY_CARE_PROVIDER_SITE_OTHER): Payer: BC Managed Care – PPO | Admitting: Family Medicine

## 2020-11-21 VITALS — BP 104/67 | HR 65 | Temp 97.8°F | Ht 74.0 in | Wt 314.0 lb

## 2020-11-21 DIAGNOSIS — Z6841 Body Mass Index (BMI) 40.0 and over, adult: Secondary | ICD-10-CM

## 2020-11-21 DIAGNOSIS — Z9189 Other specified personal risk factors, not elsewhere classified: Secondary | ICD-10-CM

## 2020-11-21 DIAGNOSIS — E785 Hyperlipidemia, unspecified: Secondary | ICD-10-CM | POA: Diagnosis not present

## 2020-11-21 DIAGNOSIS — E1169 Type 2 diabetes mellitus with other specified complication: Secondary | ICD-10-CM

## 2020-11-21 MED ORDER — RYBELSUS 7 MG PO TABS: 1.0000 | ORAL_TABLET | Freq: Every day | ORAL | 0 refills | Status: DC

## 2020-11-22 NOTE — Progress Notes (Signed)
Chief Complaint:   OBESITY Luis Russell is here to discuss his progress with his obesity treatment plan along with follow-up of his obesity related diagnoses. Neil is on the Category 4 Plan and states he is following his eating plan approximately 50-65% of the time. Byard states he is walking the dog for 10-15 minutes 5 times per week.  Today's visit was #: 14 Starting weight: 342 lbs Starting date: 03/24/2020 Today's weight: 314 lbs Today's date: 11/21/2020 Total lbs lost to date: 28 Total lbs lost since last in-office visit: 0  Interim History: Luis Russell is retaining some water weight today. He has been deviating from his plan more and although his fat level has not increased, it also has not decreased. He is willing to look at other eating plans. He notes getting less support at home from his family.  Subjective:   1. Type 2 diabetes mellitus with other specified complication, without long-term current use of insulin (HCC) Luis Russell started to have GI symptoms with his Rybelsus. He picked it up from a different pharmacy and it came ina pill bottle versus a box, and he wonders if there was something wrong with the medication. He also is going longer than 30 minutes after taking the medicine before eating.  2. Hyperlipidemia associated with type 2 diabetes mellitus (HCC) Luis Russell's last LDL was elevated, and he is working on diet and exercise. He is due for labs soon.  3. At risk for heart disease Luis Russell is at a higher than average risk for cardiovascular disease due to obesity.   Assessment/Plan:   1. Type 2 diabetes mellitus with other specified complication, without long-term current use of insulin (HCC) Good blood sugar control is important to decrease the likelihood of diabetic complications such as nephropathy, neuropathy, limb loss, blindness, coronary artery disease, and death. Intensive lifestyle modification including diet, exercise and weight loss are the first line of treatment  for diabetes. We will refill Rybelsus for 1 month and will send script to [facility]. Luis Russell is to work on eating approximately 30 minutes after taking Rybelsus. We will recheck labs in 2 weeks.  - Semaglutide (RYBELSUS) 7 MG TABS; Take 1 tablet by mouth daily.  Dispense: 30 tablet; Refill: 0  2. Hyperlipidemia associated with type 2 diabetes mellitus (HCC) Cardiovascular risk and specific lipid/LDL goals reviewed. We discussed several lifestyle modifications today. Luis Russell will continue to work on diet, exercise and weight loss efforts. We will recheck labs in 2 weeks. Orders and follow up as documented in patient record.   3. At risk for heart disease Quang was given approximately 15 minutes of coronary artery disease prevention counseling today. He is 55 y.o. male and has risk factors for heart disease including obesity. We discussed intensive lifestyle modifications today with an emphasis on specific weight loss instructions and strategies.   Repetitive spaced learning was employed today to elicit superior memory formation and behavioral change.  4. Class 3 severe obesity with serious comorbidity and body mass index (BMI) of 40.0 to 44.9 in adult, unspecified obesity type (HCC) Luis Russell is currently in the action stage of change. As such, his goal is to continue with weight loss efforts. He has agreed to change to following a lower carbohydrate, vegetable and lean protein rich diet plan.   We will recheck fasting labs at his next visit.  Exercise goals: As is.  Behavioral modification strategies: increasing lean protein intake.  Luis Russell has agreed to follow-up with our clinic in 2 weeks with Alois Cliche,  PA-C. He was informed of the importance of frequent follow-up visits to maximize his success with intensive lifestyle modifications for his multiple health conditions.   Objective:   Blood pressure 104/67, pulse 65, temperature 97.8 F (36.6 C), height 6\' 2"  (1.88 m), weight (!) 314 lb (142.4  kg), SpO2 97 %. Body mass index is 40.32 kg/m.  General: Cooperative, alert, well developed, in no acute distress. HEENT: Conjunctivae and lids unremarkable. Cardiovascular: Regular rhythm.  Lungs: Normal work of breathing. Neurologic: No focal deficits.   Lab Results  Component Value Date   CREATININE 0.94 07/13/2020   BUN 16 07/13/2020   NA 139 07/13/2020   K 4.3 07/13/2020   CL 102 07/13/2020   CO2 25 07/13/2020   Lab Results  Component Value Date   ALT 30 07/13/2020   AST 22 07/13/2020   ALKPHOS 81 07/13/2020   BILITOT 0.5 07/13/2020   Lab Results  Component Value Date   HGBA1C 5.7 (H) 07/13/2020   HGBA1C 7.0 (H) 03/24/2020   HGBA1C 5.8 (H) 06/08/2016   Lab Results  Component Value Date   INSULIN 22.1 07/13/2020   INSULIN 33.8 (H) 03/24/2020   Lab Results  Component Value Date   TSH 3.240 03/24/2020   Lab Results  Component Value Date   CHOL 217 (H) 07/13/2020   HDL 44 07/13/2020   LDLCALC 155 (H) 07/13/2020   TRIG 98 07/13/2020   Lab Results  Component Value Date   WBC 7.4 03/24/2020   HGB 13.4 03/24/2020   HCT 41.1 03/24/2020   MCV 90 03/24/2020   PLT 210 03/24/2020   No results found for: IRON, TIBC, FERRITIN  Attestation Statements:   Reviewed by clinician on day of visit: allergies, medications, problem list, medical history, surgical history, family history, social history, and previous encounter notes.   I, 03/26/2020, am acting as transcriptionist for Burt Knack, MD.  I have reviewed the above documentation for accuracy and completeness, and I agree with the above. -  [name], MD

## 2020-12-08 ENCOUNTER — Encounter (INDEPENDENT_AMBULATORY_CARE_PROVIDER_SITE_OTHER): Payer: Self-pay | Admitting: Family Medicine

## 2020-12-08 ENCOUNTER — Other Ambulatory Visit: Payer: Self-pay

## 2020-12-08 ENCOUNTER — Ambulatory Visit (INDEPENDENT_AMBULATORY_CARE_PROVIDER_SITE_OTHER): Payer: BC Managed Care – PPO | Admitting: Physician Assistant

## 2020-12-08 ENCOUNTER — Encounter (INDEPENDENT_AMBULATORY_CARE_PROVIDER_SITE_OTHER): Payer: Self-pay | Admitting: Physician Assistant

## 2020-12-08 VITALS — BP 106/71 | HR 81 | Temp 97.9°F | Ht 74.0 in | Wt 312.0 lb

## 2020-12-08 DIAGNOSIS — Z9189 Other specified personal risk factors, not elsewhere classified: Secondary | ICD-10-CM

## 2020-12-08 DIAGNOSIS — E559 Vitamin D deficiency, unspecified: Secondary | ICD-10-CM | POA: Diagnosis not present

## 2020-12-08 DIAGNOSIS — E1169 Type 2 diabetes mellitus with other specified complication: Secondary | ICD-10-CM | POA: Diagnosis not present

## 2020-12-08 DIAGNOSIS — Z6841 Body Mass Index (BMI) 40.0 and over, adult: Secondary | ICD-10-CM

## 2020-12-08 DIAGNOSIS — E785 Hyperlipidemia, unspecified: Secondary | ICD-10-CM | POA: Diagnosis not present

## 2020-12-08 MED ORDER — VITAMIN D (ERGOCALCIFEROL) 1.25 MG (50000 UNIT) PO CAPS
50000.0000 [IU] | ORAL_CAPSULE | ORAL | 0 refills | Status: DC
Start: 2020-12-08 — End: 2021-01-10

## 2020-12-08 NOTE — Progress Notes (Signed)
Chief Complaint:   OBESITY Luis Russell is here to discuss his progress with his obesity treatment plan along with follow-up of his obesity related diagnoses. Luis Russell is on following a lower carbohydrate, vegetable and lean protein rich diet plan and states he is following his eating plan approximately 50% of the time. Luis Russell states he is walking for 20 minutes 5 times per week.  Today's visit was #: 15 Starting weight: 342 lbs Starting date: 03/24/2020 Today's weight: 312 lbs Today's date: 12/08/2020 Total lbs lost to date: 30 Total lbs lost since last in-office visit: 2  Interim History: Luis Russell did the low carbohydrate plan for about 2 days, and hated it, so he changed back to the Category 4 and journaling. He does not get much support from his family at home regarding his plan. He does well with breakfast and lunch, but he struggles with dinner. In reviewing his journal today, there are some days that he is only getting 50-90 grams of protein.  Subjective:   1. Vitamin D deficiency Luis Russell is on Vit D weekly, and he denies nausea, vomiting, or muscle weakness.  2. Type 2 diabetes mellitus with other specified complication, without long-term current use of insulin (HCC) Luis Russell is on Rybelsus, and he reports that it makes him feels miserable and give him heartburn and nausea.  3. Hyperlipidemia associated with type 2 diabetes mellitus (HCC) Luis Russell is not on medications, and he denies chest pain. He is exercising regularly.  4. At risk for heart disease Luis Russell is at a higher than average risk for cardiovascular disease due to obesity.   Assessment/Plan:   1. Vitamin D deficiency Low Vitamin D level contributes to fatigue and are associated with obesity, breast, and colon cancer. We will check labs today. We will refill prescription Vitamin D for 1 month. Gid will follow-up for routine testing of Vitamin D, at least 2-3 times per year to avoid over-replacement.  - VITAMIN D 25  Hydroxy (Vit-D Deficiency, Fractures) - Vitamin D, Ergocalciferol, (DRISDOL) 1.25 MG (50000 UNIT) CAPS capsule; Take 1 capsule (50,000 Units total) by mouth every 7 (seven) days.  Dispense: 4 capsule; Refill: 0  2. Type 2 diabetes mellitus with other specified complication, without long-term current use of insulin (HCC) Good blood sugar control is important to decrease the likelihood of diabetic complications such as nephropathy, neuropathy, limb loss, blindness, coronary artery disease, and death. Intensive lifestyle modification including diet, exercise and weight loss are the first line of treatment for diabetes. We will check labs today. We discussed other GLP-1 as options. He will continue with Rybelsus.  - Comprehensive metabolic panel - Hemoglobin A1c - Insulin, random  3. Hyperlipidemia associated with type 2 diabetes mellitus (HCC) Cardiovascular risk and specific lipid/LDL goals reviewed.  We discussed several lifestyle modifications today. Luis Russell will continue to work on diet, exercise and weight loss efforts. We will check labs today. Orders and follow up as documented in patient record.   Counseling Intensive lifestyle modifications are the first line treatment for this issue. . Dietary changes: Increase soluble fiber. Decrease simple carbohydrates. . Exercise changes: Moderate to vigorous-intensity aerobic activity 150 minutes per week if tolerated. . Lipid-lowering medications: see documented in medical record.  - Lipid panel  4. At risk for heart disease Luis Russell was given approximately 15 minutes of coronary artery disease prevention counseling today. He is 55 y.o. male and has risk factors for heart disease including obesity. We discussed intensive lifestyle modifications today with an emphasis on specific  weight loss instructions and strategies.   Repetitive spaced learning was employed today to elicit superior memory formation and behavioral change.  5. Class 3 severe  obesity with serious comorbidity and body mass index (BMI) of 40.0 to 44.9 in adult, unspecified obesity type (HCC) Luis Russell is currently in the action stage of change. As such, his goal is to continue with weight loss efforts. He has agreed to the Category 4 Plan or keeping a food journal and adhering to recommended goals of 1800 calories and 120 grams of protein daily.   Luis Russell is to increase protein at breakfast and lunch.  Exercise goals: As is.  Behavioral modification strategies: increasing lean protein intake and meal planning and cooking strategies.  Luis Russell has agreed to follow-up with our clinic in 2 weeks. He was informed of the importance of frequent follow-up visits to maximize his success with intensive lifestyle modifications for his multiple health conditions.   Luis Russell was informed we would discuss his lab results at his next visit unless there is a critical issue that needs to be addressed sooner. Luis Russell agreed to keep his next visit at the agreed upon time to discuss these results.  Objective:   Blood pressure 106/71, pulse 81, temperature 97.9 F (36.6 C), height 6\' 2"  (1.88 m), weight (!) 312 lb (141.5 kg), SpO2 99 %. Body mass index is 40.06 kg/m.  General: Cooperative, alert, well developed, in no acute distress. HEENT: Conjunctivae and lids unremarkable. Cardiovascular: Regular rhythm.  Lungs: Normal work of breathing. Neurologic: No focal deficits.   Lab Results  Component Value Date   CREATININE 0.94 07/13/2020   BUN 16 07/13/2020   NA 139 07/13/2020   K 4.3 07/13/2020   CL 102 07/13/2020   CO2 25 07/13/2020   Lab Results  Component Value Date   ALT 30 07/13/2020   AST 22 07/13/2020   ALKPHOS 81 07/13/2020   BILITOT 0.5 07/13/2020   Lab Results  Component Value Date   HGBA1C 5.7 (H) 07/13/2020   HGBA1C 7.0 (H) 03/24/2020   HGBA1C 5.8 (H) 06/08/2016   Lab Results  Component Value Date   INSULIN 22.1 07/13/2020   INSULIN 33.8 (H) 03/24/2020    Lab Results  Component Value Date   TSH 3.240 03/24/2020   Lab Results  Component Value Date   CHOL 217 (H) 07/13/2020   HDL 44 07/13/2020   LDLCALC 155 (H) 07/13/2020   TRIG 98 07/13/2020   Lab Results  Component Value Date   WBC 7.4 03/24/2020   HGB 13.4 03/24/2020   HCT 41.1 03/24/2020   MCV 90 03/24/2020   PLT 210 03/24/2020   No results found for: IRON, TIBC, FERRITIN  Attestation Statements:   Reviewed by clinician on day of visit: allergies, medications, problem list, medical history, surgical history, family history, social history, and previous encounter notes.   03/26/2020, am acting as transcriptionist for Trude Mcburney, PA-C.  I have reviewed the above documentation for accuracy and completeness, and I agree with the above. [employer], PA-C

## 2020-12-09 LAB — COMPREHENSIVE METABOLIC PANEL
ALT: 23 IU/L (ref 0–44)
AST: 23 IU/L (ref 0–40)
Albumin/Globulin Ratio: 1.6 (ref 1.2–2.2)
Albumin: 4.2 g/dL (ref 3.8–4.9)
Alkaline Phosphatase: 68 IU/L (ref 44–121)
BUN/Creatinine Ratio: 17 (ref 9–20)
BUN: 18 mg/dL (ref 6–24)
Bilirubin Total: 0.7 mg/dL (ref 0.0–1.2)
CO2: 24 mmol/L (ref 20–29)
Calcium: 9.2 mg/dL (ref 8.7–10.2)
Chloride: 99 mmol/L (ref 96–106)
Creatinine, Ser: 1.09 mg/dL (ref 0.76–1.27)
GFR calc Af Amer: 87 mL/min/{1.73_m2} (ref >59)
GFR calc non Af Amer: 75 mL/min/{1.73_m2} (ref >59)
Globulin, Total: 2.6 g/dL (ref 1.5–4.5)
Glucose: 89 mg/dL (ref 65–99)
Potassium: 4.4 mmol/L (ref 3.5–5.2)
Sodium: 138 mmol/L (ref 134–144)
Total Protein: 6.8 g/dL (ref 6.0–8.5)

## 2020-12-09 LAB — LIPID PANEL
Chol/HDL Ratio: 4.5 ratio (ref 0.0–5.0)
Cholesterol, Total: 201 mg/dL — ABNORMAL HIGH (ref 100–199)
HDL: 45 mg/dL (ref >39)
LDL Chol Calc (NIH): 138 mg/dL — ABNORMAL HIGH (ref 0–99)
Triglycerides: 101 mg/dL (ref 0–149)
VLDL Cholesterol Cal: 18 mg/dL (ref 5–40)

## 2020-12-09 LAB — HEMOGLOBIN A1C
Est. average glucose Bld gHb Est-mCnc: 114 mg/dL
Hgb A1c MFr Bld: 5.6 % (ref 4.8–5.6)

## 2020-12-09 LAB — INSULIN, RANDOM: INSULIN: 16.9 u[IU]/mL (ref 2.6–24.9)

## 2020-12-09 LAB — VITAMIN D 25 HYDROXY (VIT D DEFICIENCY, FRACTURES): Vit D, 25-Hydroxy: 28.9 ng/mL — ABNORMAL LOW (ref 30.0–100.0)

## 2020-12-12 NOTE — Telephone Encounter (Signed)
Last OV with Tracey 

## 2021-01-10 ENCOUNTER — Ambulatory Visit (INDEPENDENT_AMBULATORY_CARE_PROVIDER_SITE_OTHER): Payer: BC Managed Care – PPO | Admitting: Family Medicine

## 2021-01-10 ENCOUNTER — Encounter (INDEPENDENT_AMBULATORY_CARE_PROVIDER_SITE_OTHER): Payer: Self-pay | Admitting: Family Medicine

## 2021-01-10 ENCOUNTER — Other Ambulatory Visit: Payer: Self-pay

## 2021-01-10 VITALS — BP 108/69 | HR 76 | Temp 98.0°F | Ht 74.0 in | Wt 315.0 lb

## 2021-01-10 DIAGNOSIS — Z9189 Other specified personal risk factors, not elsewhere classified: Secondary | ICD-10-CM

## 2021-01-10 DIAGNOSIS — E1169 Type 2 diabetes mellitus with other specified complication: Secondary | ICD-10-CM | POA: Diagnosis not present

## 2021-01-10 DIAGNOSIS — Z6841 Body Mass Index (BMI) 40.0 and over, adult: Secondary | ICD-10-CM

## 2021-01-10 DIAGNOSIS — E559 Vitamin D deficiency, unspecified: Secondary | ICD-10-CM | POA: Diagnosis not present

## 2021-01-10 DIAGNOSIS — E7849 Other hyperlipidemia: Secondary | ICD-10-CM

## 2021-01-10 MED ORDER — VITAMIN D (ERGOCALCIFEROL) 1.25 MG (50000 UNIT) PO CAPS: 50000.0000 [IU] | ORAL_CAPSULE | ORAL | 0 refills | Status: DC

## 2021-01-12 NOTE — Progress Notes (Signed)
Chief Complaint:   OBESITY Luis Russell is here to discuss his progress with his obesity treatment plan along with follow-up of his obesity related diagnoses. Luis Russell is on the Category 4 Plan or keeping a food journal and adhering to recommended goals of 1800 calories and 120 grams of protein daily and states he is following his eating plan approximately 40% of the time. Luis Russell states he is walking the dog for 10-15 minutes 2 times per week.  Today's visit was #: 16 Starting weight: 342 lbs Starting date: 03/24/2020 Today's weight: 315 lbs Today's date: 01/10/2021 Total lbs lost to date: 27 Total lbs lost since last in-office visit: 0  Interim History: Luis Russell has been off track with his eating plan in the last few weeks. His wife is recovering from surgery and he has been off the plan which makes it easier for him to be off track. He is especially bored with breakfast and he would like more options.  Subjective:   1. Vitamin D deficiency Luis Russell's Vit D level has decreased. He has missed some doses of his Vit D. I discussed labs with the patient today.  2. Other hyperlipidemia Luis Russell's HDL and triglycerides are at goal, but his LDL is still elevated although it is improving with diet and weight loss. He is not on statin. I discussed labs with the patient today.  3. Type 2 diabetes mellitus with other specified complication, unspecified whether long term insulin use (HCC) Luis Russell's A1c has greatly improved with diet and weight loss. He has missed some doses of Rybelsus, which may also be contributing to his weight gain. I discussed labs with the patient today.  4. At risk for heart disease Luis Russell is at a higher than average risk for cardiovascular disease due to obesity.   Assessment/Plan:   1. Vitamin D deficiency Low Vitamin D level contributes to fatigue and are associated with obesity, breast, and colon cancer. We will refill prescription Vitamin D for 1 month, and we will recheck  labs in 3 months. Luis Russell will follow-up for routine testing of Vitamin D, at least 2-3 times per year to avoid over-replacement.  - Vitamin D, Ergocalciferol, (DRISDOL) 1.25 MG (50000 UNIT) CAPS capsule; Take 1 capsule (50,000 Units total) by mouth every 7 (seven) days.  Dispense: 4 capsule; Refill: 0  2. Other hyperlipidemia Cardiovascular risk and specific lipid/LDL goals reviewed. We discussed several lifestyle modifications today. Luis Russell will continue to work on diet, exercise and weight loss efforts. He may need statin if his LDL stays above 70. Orders and follow up as documented in patient record.   3. Type 2 diabetes mellitus with other specified complication, unspecified whether long term insulin use (HCC) Good blood sugar control is important to decrease the likelihood of diabetic complications such as nephropathy, neuropathy, limb loss, blindness, coronary artery disease, and death. Intensive lifestyle modification including diet, exercise and weight loss are the first line of treatment for diabetes. Luis Russell will continue Rybelsus, and will continue to work on diet and weight loss.  4. At risk for heart disease Luis Russell was given approximately 15 minutes of coronary artery disease prevention counseling today. He is 55 y.o. male and has risk factors for heart disease including obesity. We discussed intensive lifestyle modifications today with an emphasis on specific weight loss instructions and strategies.   Repetitive spaced learning was employed today to elicit superior memory formation and behavioral change.  5. Class 3 severe obesity with serious comorbidity and body mass index (BMI) of  40.0 to 44.9 in adult, unspecified obesity type [facility]) Luis Russell is currently in the action stage of change. As such, his goal is to continue with weight loss efforts. He has agreed to the Category 4 Plan and keeping a food journal and adhering to recommended goals of 300-500 calories and 30+ grams of protein at  breakfast daily.   Exercise goals: As is.  Behavioral modification strategies: increasing lean protein intake and planning for success.  Luis Russell has agreed to follow-up with our clinic in 2 weeks. He was informed of the importance of frequent follow-up visits to maximize his success with intensive lifestyle modifications for his multiple health conditions.   Objective:   Blood pressure 108/69, pulse 76, temperature 98 F (36.7 C), height 6\' 2"  (1.88 m), weight (!) 315 lb (142.9 kg), SpO2 96 %. Body mass index is 40.44 kg/m.  General: Cooperative, alert, well developed, in no acute distress. HEENT: Conjunctivae and lids unremarkable. Cardiovascular: Regular rhythm.  Lungs: Normal work of breathing. Neurologic: No focal deficits.   Lab Results  Component Value Date   CREATININE 1.09 12/08/2020   BUN 18 12/08/2020   NA 138 12/08/2020   K 4.4 12/08/2020   CL 99 12/08/2020   CO2 24 12/08/2020   Lab Results  Component Value Date   ALT 23 12/08/2020   AST 23 12/08/2020   ALKPHOS 68 12/08/2020   BILITOT 0.7 12/08/2020   Lab Results  Component Value Date   HGBA1C 5.6 12/08/2020   HGBA1C 5.7 (H) 07/13/2020   HGBA1C 7.0 (H) 03/24/2020   HGBA1C 5.8 (H) 06/08/2016   Lab Results  Component Value Date   INSULIN 16.9 12/08/2020   INSULIN 22.1 07/13/2020   INSULIN 33.8 (H) 03/24/2020   Lab Results  Component Value Date   TSH 3.240 03/24/2020   Lab Results  Component Value Date   CHOL 201 (H) 12/08/2020   HDL 45 12/08/2020   LDLCALC 138 (H) 12/08/2020   TRIG 101 12/08/2020   CHOLHDL 4.5 12/08/2020   Lab Results  Component Value Date   WBC 7.4 03/24/2020   HGB 13.4 03/24/2020   HCT 41.1 03/24/2020   MCV 90 03/24/2020   PLT 210 03/24/2020   No results found for: IRON, TIBC, FERRITIN  Attestation Statements:   Reviewed by clinician on day of visit: allergies, medications, problem list, medical history, surgical history, family history, social history, and previous  encounter notes.   I, 03/26/2020, am acting as transcriptionist for Burt Knack, MD.  I have reviewed the above documentation for accuracy and completeness, and I agree with the above. -  [name], MD

## 2021-01-24 ENCOUNTER — Ambulatory Visit (INDEPENDENT_AMBULATORY_CARE_PROVIDER_SITE_OTHER): Payer: BC Managed Care – PPO | Admitting: Family Medicine

## 2021-02-09 ENCOUNTER — Ambulatory Visit (INDEPENDENT_AMBULATORY_CARE_PROVIDER_SITE_OTHER): Payer: BC Managed Care – PPO | Admitting: Family Medicine

## 2021-02-20 ENCOUNTER — Encounter (INDEPENDENT_AMBULATORY_CARE_PROVIDER_SITE_OTHER): Payer: Self-pay | Admitting: Family Medicine

## 2021-02-20 ENCOUNTER — Other Ambulatory Visit: Payer: Self-pay

## 2021-02-20 ENCOUNTER — Ambulatory Visit (INDEPENDENT_AMBULATORY_CARE_PROVIDER_SITE_OTHER): Payer: BC Managed Care – PPO | Admitting: Family Medicine

## 2021-02-20 VITALS — BP 105/66 | HR 82 | Temp 98.3°F | Ht 74.0 in | Wt 313.0 lb

## 2021-02-20 DIAGNOSIS — Z9189 Other specified personal risk factors, not elsewhere classified: Secondary | ICD-10-CM

## 2021-02-20 DIAGNOSIS — Z6841 Body Mass Index (BMI) 40.0 and over, adult: Secondary | ICD-10-CM | POA: Diagnosis not present

## 2021-02-20 DIAGNOSIS — E559 Vitamin D deficiency, unspecified: Secondary | ICD-10-CM

## 2021-02-20 MED ORDER — VITAMIN D (ERGOCALCIFEROL) 1.25 MG (50000 UNIT) PO CAPS: 50000.0000 [IU] | ORAL_CAPSULE | ORAL | 0 refills | Status: DC

## 2021-02-21 NOTE — Progress Notes (Signed)
Chief Complaint:   OBESITY Luis Russell is here to discuss his progress with his obesity treatment plan along with follow-up of his obesity related diagnoses. Luis Russell is on the Category 4 Plan and keeping a food journal and adhering to recommended goals of 300-500 calories and 30+ grams of protein at breakfast daily and states he is following his eating plan approximately 50-60% of the time. Luis Russell states he is doing 0 minutes 0 times per week.  Today's visit was #: 17 Starting weight: 342 lbs Starting date: 03/24/2020 Today's weight: 313 lbs Today's date: 02/20/2021 Total lbs lost to date: 29 Total lbs lost since last in-office visit: 2  Interim History: Luis Russell continue to do well with weight loss, but he notes extra challenges with work stress. He also notes increased temptations at home which makes staying on track much more difficult.   Subjective:   1. Vitamin D deficiency Luis Russell is on Vit D, and he requests a refill today. He shows no signs of over-replacement.  2. At risk for activity intolerance Luis Russell is at risk of exercise intolerance.  Assessment/Plan:   1. Vitamin D deficiency Low Vitamin D level contributes to fatigue and are associated with obesity, breast, and colon cancer. We will refill prescription Vitamin D for 1 month. Cristen will follow-up for routine testing of Vitamin D, at least 2-3 times per year to avoid over-replacement.  - Vitamin D, Ergocalciferol, (DRISDOL) 1.25 MG (50000 UNIT) CAPS capsule; Take 1 capsule (50,000 Units total) by mouth every 7 (seven) days.  Dispense: 4 capsule; Refill: 0  2. At risk for activity intolerance Luis Russell was given approximately 15 minutes of exercise intolerance counseling today. He is 55 y.o. male and has risk factors exercise intolerance including obesity. We discussed intensive lifestyle modifications today with an emphasis on specific weight loss instructions and strategies. Luis Russell will slowly increase activity as  tolerated.  Repetitive spaced learning was employed today to elicit superior memory formation and behavioral change.  3. Class 3 severe obesity with serious comorbidity and body mass index (BMI) of 40.0 to 44.9 in adult, unspecified obesity type (HCC) Luis Russell is currently in the action stage of change. As such, his goal is to continue with weight loss efforts. He has agreed to the Category 4 Plan and keeping a food journal and adhering to recommended goals of 300-500 calories and 30+ grams of protein at breakfast daily.   Behavioral modification strategies: increasing lean protein intake, meal planning and cooking strategies and dealing with family or coworker sabotage.  Luis Russell has agreed to follow-up with our clinic in 4 weeks. He was informed of the importance of frequent follow-up visits to maximize his success with intensive lifestyle modifications for his multiple health conditions.   Objective:   Blood pressure 105/66, pulse 82, temperature 98.3 F (36.8 C), height 6\' 2"  (1.88 m), weight (!) 313 lb (142 kg), SpO2 94 %. Body mass index is 40.19 kg/m.  General: Cooperative, alert, well developed, in no acute distress. HEENT: Conjunctivae and lids unremarkable. Cardiovascular: Regular rhythm.  Lungs: Normal work of breathing. Neurologic: No focal deficits.   Lab Results  Component Value Date   CREATININE 1.09 12/08/2020   BUN 18 12/08/2020   NA 138 12/08/2020   K 4.4 12/08/2020   CL 99 12/08/2020   CO2 24 12/08/2020   Lab Results  Component Value Date   ALT 23 12/08/2020   AST 23 12/08/2020   ALKPHOS 68 12/08/2020   BILITOT 0.7 12/08/2020   Lab  Results  Component Value Date   HGBA1C 5.6 12/08/2020   HGBA1C 5.7 (H) 07/13/2020   HGBA1C 7.0 (H) 03/24/2020   HGBA1C 5.8 (H) 06/08/2016   Lab Results  Component Value Date   INSULIN 16.9 12/08/2020   INSULIN 22.1 07/13/2020   INSULIN 33.8 (H) 03/24/2020   Lab Results  Component Value Date   TSH 3.240 03/24/2020    Lab Results  Component Value Date   CHOL 201 (H) 12/08/2020   HDL 45 12/08/2020   LDLCALC 138 (H) 12/08/2020   TRIG 101 12/08/2020   CHOLHDL 4.5 12/08/2020   Lab Results  Component Value Date   WBC 7.4 03/24/2020   HGB 13.4 03/24/2020   HCT 41.1 03/24/2020   MCV 90 03/24/2020   PLT 210 03/24/2020   No results found for: IRON, TIBC, FERRITIN  Attestation Statements:   Reviewed by clinician on day of visit: allergies, medications, problem list, medical history, surgical history, family history, social history, and previous encounter notes.   I, Burt Knack, am acting as transcriptionist for [name], MD.  I have reviewed the above documentation for accuracy and completeness, and I agree with the above. -  [name], MD

## 2021-04-04 ENCOUNTER — Other Ambulatory Visit (INDEPENDENT_AMBULATORY_CARE_PROVIDER_SITE_OTHER): Payer: Self-pay | Admitting: Family Medicine

## 2021-04-04 DIAGNOSIS — E559 Vitamin D deficiency, unspecified: Secondary | ICD-10-CM

## 2021-04-04 NOTE — Telephone Encounter (Signed)
DR Beasley 

## 2021-04-05 NOTE — Telephone Encounter (Signed)
Patient is requesting a refill of the following medications: Requested Prescriptions   Pending Prescriptions Disp Refills   Vitamin D, Ergocalciferol, (DRISDOL) 1.25 MG (50000 UNIT) CAPS capsule [Pharmacy Med Name: VITAMIN D2 1.25MG (50,000 UNIT)] 4 capsule 0    Sig: Take 1 capsule (50,000 Units total) by mouth every 7 (seven) days.    Last office visit:02/20/21 Date of last refill: 02/20/21 Last refill amount:4 Follow up time period per chart: 4 week Next scheduled appt: 04/20/21

## 2021-04-20 ENCOUNTER — Ambulatory Visit (INDEPENDENT_AMBULATORY_CARE_PROVIDER_SITE_OTHER): Payer: BC Managed Care – PPO | Admitting: Family Medicine

## 2021-04-20 ENCOUNTER — Encounter (INDEPENDENT_AMBULATORY_CARE_PROVIDER_SITE_OTHER): Payer: Self-pay | Admitting: Family Medicine

## 2021-04-20 ENCOUNTER — Other Ambulatory Visit: Payer: Self-pay

## 2021-04-20 VITALS — BP 106/70 | HR 77 | Temp 98.0°F | Ht 74.0 in | Wt 317.0 lb

## 2021-04-20 DIAGNOSIS — Z9189 Other specified personal risk factors, not elsewhere classified: Secondary | ICD-10-CM

## 2021-04-20 DIAGNOSIS — E1169 Type 2 diabetes mellitus with other specified complication: Secondary | ICD-10-CM | POA: Diagnosis not present

## 2021-04-20 DIAGNOSIS — E559 Vitamin D deficiency, unspecified: Secondary | ICD-10-CM | POA: Diagnosis not present

## 2021-04-20 DIAGNOSIS — Z6841 Body Mass Index (BMI) 40.0 and over, adult: Secondary | ICD-10-CM | POA: Diagnosis not present

## 2021-04-20 MED ORDER — RYBELSUS 14 MG PO TABS: 14.0000 mg | ORAL_TABLET | Freq: Every day | ORAL | 0 refills | Status: DC

## 2021-04-20 MED ORDER — VITAMIN D (ERGOCALCIFEROL) 1.25 MG (50000 UNIT) PO CAPS: 50000.0000 [IU] | ORAL_CAPSULE | ORAL | 0 refills | Status: DC

## 2021-04-26 NOTE — Progress Notes (Signed)
Chief Complaint:   OBESITY Luis Russell is here to discuss his progress with his obesity treatment plan along with follow-up of his obesity related diagnoses. Luis Russell is on the Category 4 Plan and keeping a food journal and adhering to recommended goals of 300-500 calories and 30+ grams of protein at breakfast daily and states he is following his eating plan approximately 40% of the time. Luis Russell states he is active while doing yard work.  Today's visit was #: 18 Starting weight: 342 lbs Starting date: 03/24/2020 Today's weight: 317 lbs Today's date: 04/20/2021 Total lbs lost to date: 25 Total lbs lost since last in-office visit: 0  Interim History: Luis Russell was on vacation and he did some celebration eating. He was active though and he tried to hydrate. He is ready to get back on track.  Subjective:   1. Vitamin D deficiency Luis Russell is on Vit D, but his level is not yet at goal. He needs a refill today.  2. Type 2 diabetes mellitus with other specified complication, without long-term current use of insulin (HCC) Luis Russell is working on diet and exercise. He notes his polyphagia has increased.  3. At risk for dehydration Luis Russell is at risk for dehydration due to heat and activity.  Assessment/Plan:   1. Vitamin D deficiency Low Vitamin D level contributes to fatigue and are associated with obesity, breast, and colon cancer. We will refill prescription Vitamin D for 1 month. Luis Russell will follow-up for routine testing of Vitamin D, at least 2-3 times per year to avoid over-replacement.  - Vitamin D, Ergocalciferol, (DRISDOL) 1.25 MG (50000 UNIT) CAPS capsule; Take 1 capsule (50,000 Units total) by mouth every 7 (seven) days.  Dispense: 4 capsule; Refill: 0  2. Type 2 diabetes mellitus with other specified complication, without long-term current use of insulin (HCC) Luis Russell agreed to increase Rybelsus to 14 mg q daily with no refills. Good blood sugar control is important to decrease the  likelihood of diabetic complications such as nephropathy, neuropathy, limb loss, blindness, coronary artery disease, and death. Intensive lifestyle modification including diet, exercise and weight loss are the first line of treatment for diabetes.   - Semaglutide (RYBELSUS) 14 MG TABS; Take 14 mg by mouth daily.  Dispense: 30 tablet; Refill: 0  3. At risk for dehydration Luis Russell was given approximately 15 minutes dehydration prevention counseling today. Luis Russell is at risk for dehydration due to weight loss and current medication(s). He was encouraged to hydrate and monitor fluid status to avoid dehydration as well as weight loss plateaus.   4. Obesity with current BMI 40.7 Luis Russell is currently in the action stage of change. As such, his goal is to continue with weight loss efforts. He has agreed to the Category 4 Plan.   Exercise goals: As is.  Behavioral modification strategies: increasing lean protein intake.  Luis Russell has agreed to follow-up with our clinic in 4 weeks. He was informed of the importance of frequent follow-up visits to maximize his success with intensive lifestyle modifications for his multiple health conditions.   Objective:   Blood pressure 106/70, pulse 77, temperature 98 F (36.7 C), height 6\' 2"  (1.88 m), weight (!) 317 lb (143.8 kg), SpO2 95 %. Body mass index is 40.7 kg/m.  General: Cooperative, alert, well developed, in no acute distress. HEENT: Conjunctivae and lids unremarkable. Cardiovascular: Regular rhythm.  Lungs: Normal work of breathing. Neurologic: No focal deficits.   Lab Results  Component Value Date   CREATININE 1.09 12/08/2020   BUN  18 12/08/2020   NA 138 12/08/2020   K 4.4 12/08/2020   CL 99 12/08/2020   CO2 24 12/08/2020   Lab Results  Component Value Date   ALT 23 12/08/2020   AST 23 12/08/2020   ALKPHOS 68 12/08/2020   BILITOT 0.7 12/08/2020   Lab Results  Component Value Date   HGBA1C 5.6 12/08/2020   HGBA1C 5.7 (H) 07/13/2020    HGBA1C 7.0 (H) 03/24/2020   HGBA1C 5.8 (H) 06/08/2016   Lab Results  Component Value Date   INSULIN 16.9 12/08/2020   INSULIN 22.1 07/13/2020   INSULIN 33.8 (H) 03/24/2020   Lab Results  Component Value Date   TSH 3.240 03/24/2020   Lab Results  Component Value Date   CHOL 201 (H) 12/08/2020   HDL 45 12/08/2020   LDLCALC 138 (H) 12/08/2020   TRIG 101 12/08/2020   CHOLHDL 4.5 12/08/2020   Lab Results  Component Value Date   VD25OH 28.9 (L) 12/08/2020   VD25OH 33.2 07/13/2020   VD25OH 12.8 (L) 03/24/2020   Lab Results  Component Value Date   WBC 7.4 03/24/2020   HGB 13.4 03/24/2020   HCT 41.1 03/24/2020   MCV 90 03/24/2020   PLT 210 03/24/2020   No results found for: IRON, TIBC, FERRITIN  Attestation Statements:   Reviewed by clinician on day of visit: allergies, medications, problem list, medical history, surgical history, family history, social history, and previous encounter notes.   I, Burt Knack, am acting as transcriptionist for [name], MD.  I have reviewed the above documentation for accuracy and completeness, and I agree with the above. -  [name], MD

## 2021-05-22 ENCOUNTER — Encounter (INDEPENDENT_AMBULATORY_CARE_PROVIDER_SITE_OTHER): Payer: Self-pay | Admitting: Family Medicine

## 2021-05-23 ENCOUNTER — Ambulatory Visit (INDEPENDENT_AMBULATORY_CARE_PROVIDER_SITE_OTHER): Payer: BC Managed Care – PPO | Admitting: Family Medicine

## 2021-06-02 ENCOUNTER — Other Ambulatory Visit (INDEPENDENT_AMBULATORY_CARE_PROVIDER_SITE_OTHER): Payer: Self-pay | Admitting: Family Medicine

## 2021-06-02 DIAGNOSIS — E1169 Type 2 diabetes mellitus with other specified complication: Secondary | ICD-10-CM

## 2021-07-06 ENCOUNTER — Other Ambulatory Visit: Payer: Self-pay

## 2021-07-06 ENCOUNTER — Encounter (INDEPENDENT_AMBULATORY_CARE_PROVIDER_SITE_OTHER): Payer: Self-pay | Admitting: Family Medicine

## 2021-07-06 ENCOUNTER — Ambulatory Visit (INDEPENDENT_AMBULATORY_CARE_PROVIDER_SITE_OTHER): Payer: BC Managed Care – PPO | Admitting: Family Medicine

## 2021-07-06 VITALS — BP 133/78 | HR 79 | Temp 98.4°F | Ht 74.0 in | Wt 317.0 lb

## 2021-07-06 DIAGNOSIS — E66813 Obesity, class 3: Secondary | ICD-10-CM

## 2021-07-06 DIAGNOSIS — E559 Vitamin D deficiency, unspecified: Secondary | ICD-10-CM | POA: Diagnosis not present

## 2021-07-06 DIAGNOSIS — F3289 Other specified depressive episodes: Secondary | ICD-10-CM | POA: Diagnosis not present

## 2021-07-06 DIAGNOSIS — Z9189 Other specified personal risk factors, not elsewhere classified: Secondary | ICD-10-CM | POA: Diagnosis not present

## 2021-07-06 DIAGNOSIS — E1169 Type 2 diabetes mellitus with other specified complication: Secondary | ICD-10-CM | POA: Diagnosis not present

## 2021-07-06 DIAGNOSIS — Z6841 Body Mass Index (BMI) 40.0 and over, adult: Secondary | ICD-10-CM

## 2021-07-06 MED ORDER — VITAMIN D (ERGOCALCIFEROL) 1.25 MG (50000 UNIT) PO CAPS: 50000.0000 [IU] | ORAL_CAPSULE | ORAL | 0 refills | Status: DC

## 2021-07-06 MED ORDER — LIRAGLUTIDE 18 MG/3ML ~~LOC~~ SOPN: 1.2000 mg | PEN_INJECTOR | Freq: Every morning | SUBCUTANEOUS | 0 refills | Status: DC

## 2021-07-06 MED ORDER — VORTIOXETINE HBR 10 MG PO TABS: 10.0000 mg | ORAL_TABLET | Freq: Every day | ORAL | 0 refills | Status: DC

## 2021-07-06 NOTE — Progress Notes (Signed)
Chief Complaint:   OBESITY Luis Russell is here to discuss his progress with his obesity treatment plan along with follow-up of his obesity related diagnoses. Luis Russell is on the Category 4 Plan and states he is following his eating plan approximately 30% of the time. Luis Russell states he is doing 0 minutes 0 times per week.  Today's visit was #: 19 Starting weight: 342 lbs Starting date: 03/24/2020 Today's weight: 317 lbs Today's date: 07/06/2021 Total lbs lost to date: 25 Total lbs lost since last in-office visit: 0  Interim History: Luis Russell's last visit was approximately 3 months ago. He has done well maintaining his weight while he hasn't really been able to follow his plan closely. He has tried to portion control. He is ready to get back on track.  Subjective:   1. Vitamin D deficiency Luis Russell is stable on Vit D, and he is due for labs soon.  2. Type 2 diabetes mellitus with other specified complication, unspecified whether long term insulin use (HCC) Luis Russell notes increased GI upset with Rybelsus and he is interested in looking at another option.  3. Other depression Luis Russell's mood is stable on his medications. He is looking for a new Psychiatrist.  4. At risk for impaired metabolic function Luis Russell is at increased risk for impaired metabolic function due to decreased protein.  Assessment/Plan:   1. Vitamin D deficiency Low Vitamin D level contributes to fatigue and are associated with obesity, breast, and colon cancer. We will refill prescription Vitamin D 50,000 IU every week for 1 month. We will recheck labs next month, and Luis Russell will follow-up for routine testing of Vitamin D, at least 2-3 times per year to avoid over-replacement.  - Vitamin D, Ergocalciferol, (DRISDOL) 1.25 MG (50000 UNIT) CAPS capsule; Take 1 capsule (50,000 Units total) by mouth every 7 (seven) days.  Dispense: 4 capsule; Refill: 0  2. Type 2 diabetes mellitus with other specified complication, unspecified  whether long term insulin use (HCC) Luis Russell agreed to discontinue Rybelsus and start Victoza at 1.2 mg q AM with no refills. Good blood sugar control is important to decrease the likelihood of diabetic complications such as nephropathy, neuropathy, limb loss, blindness, coronary artery disease, and death. Intensive lifestyle modification including diet, exercise and weight loss are the first line of treatment for diabetes.   - liraglutide (VICTOZA) 18 MG/3ML SOPN; Inject 1.2 mg into the skin in the morning.  Dispense: 6 mL; Refill: 0  3. Other depression Behavior modification techniques were discussed today to help Luis Russell deal with his emotional/non-hunger eating behaviors. We will refill Trintellix for 1 month and he will continue Wellbutrin SR (will refill 1 time while he works on getting a Producer, television/film/video). Orders and follow up as documented in patient record.   - vortioxetine HBr (TRINTELLIX) 10 MG TABS tablet; Take 1 tablet (10 mg total) by mouth daily.  Dispense: 30 tablet; Refill: 0  4. At risk for impaired metabolic function Luis Russell was given approximately 15 minutes of impaired  metabolic function prevention counseling today. We discussed intensive lifestyle modifications today with an emphasis on specific nutrition and exercise instructions and strategies.   Repetitive spaced learning was employed today to elicit superior memory formation and behavioral change.  5. Obesity with current BMI 40.8 Luis Russell is currently in the action stage of change. As such, his goal is to continue with weight loss efforts. He has agreed to the Category 4 Plan.   Behavioral modification strategies: increasing lean protein intake.  Luis Russell has  agreed to follow-up with our clinic in 4 weeks. He was informed of the importance of frequent follow-up visits to maximize his success with intensive lifestyle modifications for his multiple health conditions.   Objective:   Blood pressure 133/78, pulse 79,  temperature 98.4 F (36.9 C), height 6\' 2"  (1.88 m), weight (!) 317 lb (143.8 kg), SpO2 96 %. Body mass index is 40.7 kg/m.  General: Cooperative, alert, well developed, in no acute distress. HEENT: Conjunctivae and lids unremarkable. Cardiovascular: Regular rhythm.  Lungs: Normal work of breathing. Neurologic: No focal deficits.   Lab Results  Component Value Date   CREATININE 1.09 12/08/2020   BUN 18 12/08/2020   NA 138 12/08/2020   K 4.4 12/08/2020   CL 99 12/08/2020   CO2 24 12/08/2020   Lab Results  Component Value Date   ALT 23 12/08/2020   AST 23 12/08/2020   ALKPHOS 68 12/08/2020   BILITOT 0.7 12/08/2020   Lab Results  Component Value Date   HGBA1C 5.6 12/08/2020   HGBA1C 5.7 (H) 07/13/2020   HGBA1C 7.0 (H) 03/24/2020   HGBA1C 5.8 (H) 06/08/2016   Lab Results  Component Value Date   INSULIN 16.9 12/08/2020   INSULIN 22.1 07/13/2020   INSULIN 33.8 (H) 03/24/2020   Lab Results  Component Value Date   TSH 3.240 03/24/2020   Lab Results  Component Value Date   CHOL 201 (H) 12/08/2020   HDL 45 12/08/2020   LDLCALC 138 (H) 12/08/2020   TRIG 101 12/08/2020   CHOLHDL 4.5 12/08/2020   Lab Results  Component Value Date   VD25OH 28.9 (L) 12/08/2020   VD25OH 33.2 07/13/2020   VD25OH 12.8 (L) 03/24/2020   Lab Results  Component Value Date   WBC 7.4 03/24/2020   HGB 13.4 03/24/2020   HCT 41.1 03/24/2020   MCV 90 03/24/2020   PLT 210 03/24/2020   No results found for: IRON, TIBC, FERRITIN  Attestation Statements:   Reviewed by clinician on day of visit: allergies, medications, problem list, medical history, surgical history, family history, social history, and previous encounter notes.   I, 03/26/2020, am acting as transcriptionist for Burt Knack, MD.  I have reviewed the above documentation for accuracy and completeness, and I agree with the above. -  [name], MD

## 2021-07-10 ENCOUNTER — Encounter (INDEPENDENT_AMBULATORY_CARE_PROVIDER_SITE_OTHER): Payer: Self-pay | Admitting: Family Medicine

## 2021-07-10 DIAGNOSIS — R7303 Prediabetes: Secondary | ICD-10-CM

## 2021-07-11 MED ORDER — INSULIN PEN NEEDLE 32G X 4 MM MISC: 0 refills | Status: DC

## 2021-07-11 NOTE — Telephone Encounter (Signed)
Did we send in the script for pen needles?

## 2021-07-11 NOTE — Telephone Encounter (Signed)
Yes

## 2021-07-11 NOTE — Telephone Encounter (Signed)
Please see message and advise.  Thank you. ° °

## 2021-08-02 ENCOUNTER — Encounter (INDEPENDENT_AMBULATORY_CARE_PROVIDER_SITE_OTHER): Payer: Self-pay | Admitting: Emergency Medicine

## 2021-08-02 ENCOUNTER — Other Ambulatory Visit (INDEPENDENT_AMBULATORY_CARE_PROVIDER_SITE_OTHER): Payer: Self-pay | Admitting: Family Medicine

## 2021-08-02 DIAGNOSIS — F3289 Other specified depressive episodes: Secondary | ICD-10-CM

## 2021-08-02 DIAGNOSIS — E1169 Type 2 diabetes mellitus with other specified complication: Secondary | ICD-10-CM

## 2021-08-02 NOTE — Telephone Encounter (Signed)
Msg has been sent via mychart.

## 2021-08-07 ENCOUNTER — Ambulatory Visit (INDEPENDENT_AMBULATORY_CARE_PROVIDER_SITE_OTHER): Payer: BC Managed Care – PPO | Admitting: Family Medicine

## 2021-08-07 ENCOUNTER — Encounter (INDEPENDENT_AMBULATORY_CARE_PROVIDER_SITE_OTHER): Payer: Self-pay | Admitting: Family Medicine

## 2021-08-07 ENCOUNTER — Other Ambulatory Visit: Payer: Self-pay

## 2021-08-07 VITALS — BP 103/71 | HR 77 | Temp 98.2°F | Ht 74.0 in | Wt 310.0 lb

## 2021-08-07 DIAGNOSIS — Z9189 Other specified personal risk factors, not elsewhere classified: Secondary | ICD-10-CM | POA: Diagnosis not present

## 2021-08-07 DIAGNOSIS — E1169 Type 2 diabetes mellitus with other specified complication: Secondary | ICD-10-CM

## 2021-08-07 DIAGNOSIS — Z6841 Body Mass Index (BMI) 40.0 and over, adult: Secondary | ICD-10-CM

## 2021-08-07 DIAGNOSIS — E559 Vitamin D deficiency, unspecified: Secondary | ICD-10-CM

## 2021-08-07 MED ORDER — VITAMIN D (ERGOCALCIFEROL) 1.25 MG (50000 UNIT) PO CAPS: 50000.0000 [IU] | ORAL_CAPSULE | ORAL | 0 refills | Status: DC

## 2021-08-07 NOTE — Telephone Encounter (Signed)
Patient was seen today and stated he did not need a refill at this time

## 2021-08-08 NOTE — Progress Notes (Signed)
Chief Complaint:   OBESITY Luis Russell is here to discuss his progress with his obesity treatment plan along with follow-up of his obesity related diagnoses. Luis Russell is on the Category 4 Plan and states he is following his eating plan approximately 50% of the time. Luis Russell states he is walking for 2 hours 2-3 times per week.  Today's visit was #: 20 Starting weight: 342 lbs Starting date: 03/24/2020 Today's weight: 310 lbs Today's date: 08/07/2021 Total lbs lost to date: 32 Total lbs lost since last in-office visit: 7  Interim History: Luis Russell continues to do well with weight loss. His hunger is controlled and he is doing more activity. He is not always eating all of the food on his plan, which may be decreasing his RMR.  Subjective:   1. Type 2 diabetes mellitus with other specified complication, without long-term current use of insulin (HCC) Luis Russell is stable on Victoza, and he has had a variety of pharmacy errors which is frustrating. He is doing well with diet, exercise, and weight loss.  2. Vitamin D deficiency Luis Russell is on Vit D, but his level is not yet at goal.   3. At risk for impaired metabolic function Luis Russell is at increased risk for impaired metabolic function if protein or calories decreases.  Assessment/Plan:   1. Type 2 diabetes mellitus with other specified complication, without long-term current use of insulin (HCC) Luis Russell will continue Victoza at 1.2 mg and will continue to follow up as directed. Good blood sugar control is important to decrease the likelihood of diabetic complications such as nephropathy, neuropathy, limb loss, blindness, coronary artery disease, and death. Intensive lifestyle modification including diet, exercise and weight loss are the first line of treatment for diabetes.   2. Vitamin D deficiency Low Vitamin D level contributes to fatigue and are associated with obesity, breast, and colon cancer. We will refill prescription Vitamin D for 1 month.  Luis Russell will follow-up for routine testing of Vitamin D, at least 2-3 times per year to avoid over-replacement.  - Vitamin D, Ergocalciferol, (DRISDOL) 1.25 MG (50000 UNIT) CAPS capsule; Take 1 capsule (50,000 Units total) by mouth every 7 (seven) days.  Dispense: 4 capsule; Refill: 0  3. At risk for impaired metabolic function Luis Russell was given approximately 15 minutes of impaired  metabolic function prevention counseling today. We discussed intensive lifestyle modifications today with an emphasis on specific nutrition and exercise instructions and strategies.   Repetitive spaced learning was employed today to elicit superior memory formation and behavioral change.  4. Obesity with current BMI 39.9 Luis Russell is currently in the action stage of change. As such, his goal is to continue with weight loss efforts. He has agreed to the Category 4 Plan or keeping a food journal and adhering to recommended goals of 1500-1800 calories and 100+ grams of protein daily.   Exercise goals: As is.  Behavioral modification strategies: increasing lean protein intake and no skipping meals.  Luis Russell has agreed to follow-up with our clinic in 4 weeks. He was informed of the importance of frequent follow-up visits to maximize his success with intensive lifestyle modifications for his multiple health conditions.   Objective:   Blood pressure 103/71, pulse 77, temperature 98.2 F (36.8 C), height 6\' 2"  (1.88 m), weight (!) 310 lb (140.6 kg), SpO2 96 %. Body mass index is 39.8 kg/m.  General: Cooperative, alert, well developed, in no acute distress. HEENT: Conjunctivae and lids unremarkable. Cardiovascular: Regular rhythm.  Lungs: Normal work of breathing. Neurologic:  No focal deficits.   Lab Results  Component Value Date   CREATININE 1.09 12/08/2020   BUN 18 12/08/2020   NA 138 12/08/2020   K 4.4 12/08/2020   CL 99 12/08/2020   CO2 24 12/08/2020   Lab Results  Component Value Date   ALT 23 12/08/2020    AST 23 12/08/2020   ALKPHOS 68 12/08/2020   BILITOT 0.7 12/08/2020   Lab Results  Component Value Date   HGBA1C 5.6 12/08/2020   HGBA1C 5.7 (H) 07/13/2020   HGBA1C 7.0 (H) 03/24/2020   HGBA1C 5.8 (H) 06/08/2016   Lab Results  Component Value Date   INSULIN 16.9 12/08/2020   INSULIN 22.1 07/13/2020   INSULIN 33.8 (H) 03/24/2020   Lab Results  Component Value Date   TSH 3.240 03/24/2020   Lab Results  Component Value Date   CHOL 201 (H) 12/08/2020   HDL 45 12/08/2020   LDLCALC 138 (H) 12/08/2020   TRIG 101 12/08/2020   CHOLHDL 4.5 12/08/2020   Lab Results  Component Value Date   VD25OH 28.9 (L) 12/08/2020   VD25OH 33.2 07/13/2020   VD25OH 12.8 (L) 03/24/2020   Lab Results  Component Value Date   WBC 7.4 03/24/2020   HGB 13.4 03/24/2020   HCT 41.1 03/24/2020   MCV 90 03/24/2020   PLT 210 03/24/2020   No results found for: IRON, TIBC, FERRITIN  Attestation Statements:   Reviewed by clinician on day of visit: allergies, medications, problem list, medical history, surgical history, family history, social history, and previous encounter notes.   I, Burt Knack, am acting as transcriptionist for [name], MD.  I have reviewed the above documentation for accuracy and completeness, and I agree with the above. -  [name], MD

## 2021-08-28 ENCOUNTER — Encounter (INDEPENDENT_AMBULATORY_CARE_PROVIDER_SITE_OTHER): Payer: Self-pay | Admitting: Family Medicine

## 2021-08-28 ENCOUNTER — Other Ambulatory Visit (INDEPENDENT_AMBULATORY_CARE_PROVIDER_SITE_OTHER): Payer: Self-pay | Admitting: Family Medicine

## 2021-08-28 DIAGNOSIS — E1169 Type 2 diabetes mellitus with other specified complication: Secondary | ICD-10-CM

## 2021-08-28 MED ORDER — LIRAGLUTIDE 18 MG/3ML ~~LOC~~ SOPN: 1.2000 mg | PEN_INJECTOR | Freq: Every morning | SUBCUTANEOUS | 0 refills | Status: DC

## 2021-08-28 NOTE — Telephone Encounter (Signed)
LAST APPOINTMENT DATE: 08/07/21 NEXT APPOINTMENT DATE: 09/06/21   [id number] - [place], [place] - [address] [place] [address] Phone: [phone] Fax: [phone]  Patient is requesting a refill of the following medications: Requested Prescriptions   Pending Prescriptions Disp Refills   liraglutide (VICTOZA) 18 MG/3ML SOPN 6 mL 0    Sig: Inject 1.2 mg into the skin in the morning.    Date last filled: 07/06/21 Previously prescribed by Dr. Dalbert Garnet  Lab Results  Component Value Date   HGBA1C 5.6 12/08/2020   HGBA1C 5.7 (H) 07/13/2020   HGBA1C 7.0 (H) 03/24/2020   Lab Results  Component Value Date   LDLCALC 138 (H) 12/08/2020   CREATININE 1.09 12/08/2020   Lab Results  Component Value Date   VD25OH 28.9 (L) 12/08/2020   VD25OH 33.2 07/13/2020   VD25OH 12.8 (L) 03/24/2020    BP Readings from Last 3 Encounters:  08/07/21 103/71  07/06/21 133/78  04/20/21 106/70

## 2021-09-06 ENCOUNTER — Ambulatory Visit (INDEPENDENT_AMBULATORY_CARE_PROVIDER_SITE_OTHER): Payer: BC Managed Care – PPO | Admitting: Family Medicine

## 2021-09-25 ENCOUNTER — Other Ambulatory Visit (INDEPENDENT_AMBULATORY_CARE_PROVIDER_SITE_OTHER): Payer: Self-pay | Admitting: Family Medicine

## 2021-09-25 DIAGNOSIS — E1169 Type 2 diabetes mellitus with other specified complication: Secondary | ICD-10-CM

## 2021-10-02 ENCOUNTER — Encounter (INDEPENDENT_AMBULATORY_CARE_PROVIDER_SITE_OTHER): Payer: Self-pay

## 2021-10-03 ENCOUNTER — Encounter (INDEPENDENT_AMBULATORY_CARE_PROVIDER_SITE_OTHER): Payer: Self-pay

## 2021-10-03 ENCOUNTER — Other Ambulatory Visit: Payer: Self-pay

## 2021-10-03 ENCOUNTER — Encounter (INDEPENDENT_AMBULATORY_CARE_PROVIDER_SITE_OTHER): Payer: Self-pay | Admitting: Family Medicine

## 2021-10-03 ENCOUNTER — Telehealth (INDEPENDENT_AMBULATORY_CARE_PROVIDER_SITE_OTHER): Payer: Self-pay | Admitting: Family Medicine

## 2021-10-03 ENCOUNTER — Other Ambulatory Visit (INDEPENDENT_AMBULATORY_CARE_PROVIDER_SITE_OTHER): Payer: Self-pay | Admitting: Family Medicine

## 2021-10-03 ENCOUNTER — Ambulatory Visit (INDEPENDENT_AMBULATORY_CARE_PROVIDER_SITE_OTHER): Payer: BC Managed Care – PPO | Admitting: Family Medicine

## 2021-10-03 VITALS — BP 101/67 | HR 90 | Temp 98.2°F | Ht 74.0 in | Wt 321.0 lb

## 2021-10-03 DIAGNOSIS — E559 Vitamin D deficiency, unspecified: Secondary | ICD-10-CM

## 2021-10-03 DIAGNOSIS — E1169 Type 2 diabetes mellitus with other specified complication: Secondary | ICD-10-CM | POA: Diagnosis not present

## 2021-10-03 DIAGNOSIS — Z6841 Body Mass Index (BMI) 40.0 and over, adult: Secondary | ICD-10-CM

## 2021-10-03 DIAGNOSIS — Z9189 Other specified personal risk factors, not elsewhere classified: Secondary | ICD-10-CM | POA: Diagnosis not present

## 2021-10-03 MED ORDER — VITAMIN D (ERGOCALCIFEROL) 1.25 MG (50000 UNIT) PO CAPS: 50000.0000 [IU] | ORAL_CAPSULE | ORAL | 0 refills | Status: DC

## 2021-10-03 MED ORDER — TIRZEPATIDE 10 MG/0.5ML ~~LOC~~ SOAJ: 10.0000 mg | SUBCUTANEOUS | 0 refills | Status: DC

## 2021-10-03 NOTE — Telephone Encounter (Signed)
Prior authorization denied for [facility]. Patient sent mychart message with coupon information, pt has a diagnosis of type 2 diabetes.

## 2021-10-03 NOTE — Telephone Encounter (Signed)
Please check PA, Thanks

## 2021-10-03 NOTE — Telephone Encounter (Signed)
Prior authorization has been started for [employer]. Will notify patient and provider once response is received.  

## 2021-10-03 NOTE — Progress Notes (Signed)
Chief Complaint:   OBESITY Luis Russell is here to discuss his progress with his obesity treatment plan along with follow-up of his obesity related diagnoses. Luis Russell is on the Category 4 Plan or keeping a food journal and adhering to recommended goals of 1500-1800 calories and 100+ grams of protein daily and states he is following his eating plan approximately 50% of the time. Luis Russell states he is walking and doing yard work for 5-10 minutes 3-4 times per week.  Today's visit was #: 21 Starting weight: 342 lbs Starting date: 03/24/2020 Today's weight: 321 lbs Today's date: 10/03/2021 Total lbs lost to date: 21 Total lbs lost since last in-office visit: 0  Interim History: Luis Russell has struggled to stay on track. There has been more temptations and his cravings have increased. He is also retaining some fluid.  Subjective:   1. Type 2 diabetes mellitus with other specified complication, without long-term current use of insulin (HCC) Luis Russell is on Victoza and his A1c is at goal, and has improved but he has increased simple carbohydrates recently.  2. Vitamin D deficiency Luis Russell is stable on Vit D, and he denies nausea or vomiting. He requests a refill.  3. At risk for heart disease Luis Russell is at a higher than average risk for cardiovascular disease due to obesity.    Assessment/Plan:   1. Type 2 diabetes mellitus with other specified complication, without long-term current use of insulin (HCC) Luis Russell agreed to discontinue Victoza, and start Mounjaro 10 mg q weekly with no refills. Good blood sugar control is important to decrease the likelihood of diabetic complications such as nephropathy, neuropathy, limb loss, blindness, coronary artery disease, and death. Intensive lifestyle modification including diet, exercise and weight loss are the first line of treatment for diabetes.   - tirzepatide (MOUNJARO) 10 MG/0.5ML Pen; Inject 10 mg into the skin once a week.  Dispense: 2 mL; Refill: 0  2.  Vitamin D deficiency Luis Russell will continue prescription Vitamin D 50,000 IU every week, and we will refill for 1 month. He will follow-up for routine testing of Vitamin D, at least 2-3 times per year to avoid over-replacement.  - Vitamin D, Ergocalciferol, (DRISDOL) 1.25 MG (50000 UNIT) CAPS capsule; Take 1 capsule (50,000 Units total) by mouth every 7 (seven) days.  Dispense: 4 capsule; Refill: 0  3. At risk for heart disease Luis Russell was given approximately 15 minutes of coronary artery disease prevention counseling today. He is 55 y.o. male and has risk factors for heart disease including obesity. We discussed intensive lifestyle modifications today with an emphasis on specific weight loss instructions and strategies.   Repetitive spaced learning was employed today to elicit superior memory formation and behavioral change.  4. Obesity with current BMI of 41.2 Luis Russell is currently in the action stage of change. As such, his goal is to continue with weight loss efforts. He has agreed to the Category 4 Plan.   Family support handout was given.  Exercise goals: As is.  Behavioral modification strategies: emotional eating strategies and dealing with family or coworker sabotage.  Luis Russell has agreed to follow-up with our clinic in 4 weeks. He was informed of the importance of frequent follow-up visits to maximize his success with intensive lifestyle modifications for his multiple health conditions.   Objective:   Blood pressure 101/67, pulse 90, temperature 98.2 F (36.8 C), height 6\' 2"  (1.88 m), weight (!) 321 lb (145.6 kg), SpO2 94 %. Body mass index is 41.21 kg/m.  General: Cooperative, alert,  well developed, in no acute distress. HEENT: Conjunctivae and lids unremarkable. Cardiovascular: Regular rhythm.  Lungs: Normal work of breathing. Neurologic: No focal deficits.   Lab Results  Component Value Date   CREATININE 1.09 12/08/2020   BUN 18 12/08/2020   NA 138 12/08/2020   K 4.4  12/08/2020   CL 99 12/08/2020   CO2 24 12/08/2020   Lab Results  Component Value Date   ALT 23 12/08/2020   AST 23 12/08/2020   ALKPHOS 68 12/08/2020   BILITOT 0.7 12/08/2020   Lab Results  Component Value Date   HGBA1C 5.6 12/08/2020   HGBA1C 5.7 (H) 07/13/2020   HGBA1C 7.0 (H) 03/24/2020   HGBA1C 5.8 (H) 06/08/2016   Lab Results  Component Value Date   INSULIN 16.9 12/08/2020   INSULIN 22.1 07/13/2020   INSULIN 33.8 (H) 03/24/2020   Lab Results  Component Value Date   TSH 3.240 03/24/2020   Lab Results  Component Value Date   CHOL 201 (H) 12/08/2020   HDL 45 12/08/2020   LDLCALC 138 (H) 12/08/2020   TRIG 101 12/08/2020   CHOLHDL 4.5 12/08/2020   Lab Results  Component Value Date   VD25OH 28.9 (L) 12/08/2020   VD25OH 33.2 07/13/2020   VD25OH 12.8 (L) 03/24/2020   Lab Results  Component Value Date   WBC 7.4 03/24/2020   HGB 13.4 03/24/2020   HCT 41.1 03/24/2020   MCV 90 03/24/2020   PLT 210 03/24/2020   No results found for: IRON, TIBC, FERRITIN  Attestation Statements:   Reviewed by clinician on day of visit: allergies, medications, problem list, medical history, surgical history, family history, social history, and previous encounter notes.   I, Burt Knack, am acting as transcriptionist for [name], MD.  I have reviewed the above documentation for accuracy and completeness, and I agree with the above. -  [name], MD

## 2021-10-04 NOTE — Telephone Encounter (Signed)
Prior authorization was denied for [facility]. Patient sent mychart message yesterday on how to obtain [facility], patient has type 2 dm and coupon savings card information was sent to patient.

## 2021-10-04 NOTE — Telephone Encounter (Signed)
Pt last seen by Dr. Beasley.  

## 2021-10-07 ENCOUNTER — Other Ambulatory Visit (INDEPENDENT_AMBULATORY_CARE_PROVIDER_SITE_OTHER): Payer: Self-pay | Admitting: Family Medicine

## 2021-10-07 ENCOUNTER — Encounter (INDEPENDENT_AMBULATORY_CARE_PROVIDER_SITE_OTHER): Payer: Self-pay | Admitting: Family Medicine

## 2021-10-07 DIAGNOSIS — E1169 Type 2 diabetes mellitus with other specified complication: Secondary | ICD-10-CM

## 2021-10-09 NOTE — Telephone Encounter (Signed)
Please see message and advise.  Thank you. ° °

## 2021-10-09 NOTE — Telephone Encounter (Signed)
Pt last seen by Dr. Beasley.  

## 2021-10-12 ENCOUNTER — Encounter (INDEPENDENT_AMBULATORY_CARE_PROVIDER_SITE_OTHER): Payer: Self-pay | Admitting: Family Medicine

## 2021-10-12 DIAGNOSIS — E1169 Type 2 diabetes mellitus with other specified complication: Secondary | ICD-10-CM

## 2021-10-16 ENCOUNTER — Encounter (INDEPENDENT_AMBULATORY_CARE_PROVIDER_SITE_OTHER): Payer: Self-pay

## 2021-10-16 ENCOUNTER — Encounter (INDEPENDENT_AMBULATORY_CARE_PROVIDER_SITE_OTHER): Payer: Self-pay | Admitting: Family Medicine

## 2021-10-16 ENCOUNTER — Telehealth (INDEPENDENT_AMBULATORY_CARE_PROVIDER_SITE_OTHER): Payer: Self-pay | Admitting: Family Medicine

## 2021-10-16 MED ORDER — TIRZEPATIDE 10 MG/0.5ML ~~LOC~~ SOAJ: 10.0000 mg | SUBCUTANEOUS | 0 refills | Status: DC

## 2021-10-16 NOTE — Telephone Encounter (Signed)
You will have to call the pharmacy. Prior authorization was denied for [facility]. Patient has type 2 diabetes and even though it was denied, patient is still able to get medication for $25 with the coupon savings card. Patient was sent mychart message and how to use coupon savings card earlier today.

## 2021-10-16 NOTE — Telephone Encounter (Signed)
Prior authorization denied for [facility]. Patient has type 2 diabetes. Patient sent coupon savings card information with mychart message.

## 2021-10-16 NOTE — Telephone Encounter (Signed)
Can you help with this please?  Thank you ahead of time.

## 2021-10-17 NOTE — Telephone Encounter (Signed)
ok 

## 2021-10-17 NOTE — Telephone Encounter (Signed)
FYI

## 2021-10-18 ENCOUNTER — Other Ambulatory Visit (INDEPENDENT_AMBULATORY_CARE_PROVIDER_SITE_OTHER): Payer: Self-pay | Admitting: Family Medicine

## 2021-10-18 DIAGNOSIS — E1169 Type 2 diabetes mellitus with other specified complication: Secondary | ICD-10-CM

## 2021-10-25 ENCOUNTER — Encounter (INDEPENDENT_AMBULATORY_CARE_PROVIDER_SITE_OTHER): Payer: Self-pay | Admitting: Family Medicine

## 2021-10-25 DIAGNOSIS — E1169 Type 2 diabetes mellitus with other specified complication: Secondary | ICD-10-CM

## 2021-10-25 MED ORDER — VICTOZA 18 MG/3ML ~~LOC~~ SOPN: 1.8000 mg | PEN_INJECTOR | Freq: Every day | SUBCUTANEOUS | 0 refills | Status: DC

## 2021-10-25 NOTE — Telephone Encounter (Signed)
Medication sent today, per Dr. Dalbert Garnet.

## 2021-10-25 NOTE — Telephone Encounter (Signed)
Ok, dc mounjaro and send in Victoza 1.8 mg qday x 1 month

## 2021-11-09 ENCOUNTER — Other Ambulatory Visit: Payer: Self-pay

## 2021-11-09 ENCOUNTER — Encounter (INDEPENDENT_AMBULATORY_CARE_PROVIDER_SITE_OTHER): Payer: Self-pay | Admitting: Family Medicine

## 2021-11-09 ENCOUNTER — Ambulatory Visit (INDEPENDENT_AMBULATORY_CARE_PROVIDER_SITE_OTHER): Payer: BC Managed Care – PPO | Admitting: Family Medicine

## 2021-11-09 VITALS — BP 103/67 | HR 86 | Temp 98.3°F | Ht 74.0 in | Wt 325.0 lb

## 2021-11-09 DIAGNOSIS — E669 Obesity, unspecified: Secondary | ICD-10-CM

## 2021-11-09 DIAGNOSIS — E1169 Type 2 diabetes mellitus with other specified complication: Secondary | ICD-10-CM | POA: Diagnosis not present

## 2021-11-09 DIAGNOSIS — E559 Vitamin D deficiency, unspecified: Secondary | ICD-10-CM | POA: Diagnosis not present

## 2021-11-09 DIAGNOSIS — Z6841 Body Mass Index (BMI) 40.0 and over, adult: Secondary | ICD-10-CM | POA: Diagnosis not present

## 2021-11-09 DIAGNOSIS — Z7985 Long-term (current) use of injectable non-insulin antidiabetic drugs: Secondary | ICD-10-CM

## 2021-11-09 DIAGNOSIS — Z9189 Other specified personal risk factors, not elsewhere classified: Secondary | ICD-10-CM

## 2021-11-09 MED ORDER — VICTOZA 18 MG/3ML ~~LOC~~ SOPN: 1.8000 mg | PEN_INJECTOR | Freq: Every day | SUBCUTANEOUS | 0 refills | Status: DC

## 2021-11-09 MED ORDER — VITAMIN D (ERGOCALCIFEROL) 1.25 MG (50000 UNIT) PO CAPS: 50000.0000 [IU] | ORAL_CAPSULE | ORAL | 0 refills | Status: DC

## 2021-11-10 LAB — LIPID PANEL WITH LDL/HDL RATIO
Cholesterol, Total: 224 mg/dL — ABNORMAL HIGH (ref 100–199)
HDL: 50 mg/dL (ref >39)
LDL Chol Calc (NIH): 157 mg/dL — ABNORMAL HIGH (ref 0–99)
LDL/HDL Ratio: 3.1 ratio (ref 0.0–3.6)
Triglycerides: 96 mg/dL (ref 0–149)
VLDL Cholesterol Cal: 17 mg/dL (ref 5–40)

## 2021-11-10 LAB — CMP14+EGFR
ALT: 33 IU/L (ref 0–44)
AST: 24 IU/L (ref 0–40)
Albumin/Globulin Ratio: 1.7 (ref 1.2–2.2)
Albumin: 4.2 g/dL (ref 3.8–4.9)
Alkaline Phosphatase: 58 IU/L (ref 44–121)
BUN/Creatinine Ratio: 15 (ref 9–20)
BUN: 15 mg/dL (ref 6–24)
Bilirubin Total: 0.7 mg/dL (ref 0.0–1.2)
CO2: 26 mmol/L (ref 20–29)
Calcium: 9 mg/dL (ref 8.7–10.2)
Chloride: 101 mmol/L (ref 96–106)
Creatinine, Ser: 0.98 mg/dL (ref 0.76–1.27)
Globulin, Total: 2.5 g/dL (ref 1.5–4.5)
Glucose: 110 mg/dL — ABNORMAL HIGH (ref 70–99)
Potassium: 4.3 mmol/L (ref 3.5–5.2)
Sodium: 139 mmol/L (ref 134–144)
Total Protein: 6.7 g/dL (ref 6.0–8.5)
eGFR: 90 mL/min/{1.73_m2} (ref >59)

## 2021-11-10 LAB — HEMOGLOBIN A1C
Est. average glucose Bld gHb Est-mCnc: 131 mg/dL
Hgb A1c MFr Bld: 6.2 % — ABNORMAL HIGH (ref 4.8–5.6)

## 2021-11-10 LAB — VITAMIN D 25 HYDROXY (VIT D DEFICIENCY, FRACTURES): Vit D, 25-Hydroxy: 34.9 ng/mL (ref 30.0–100.0)

## 2021-11-10 LAB — INSULIN, RANDOM: INSULIN: 23.1 u[IU]/mL (ref 2.6–24.9)

## 2021-11-13 NOTE — Progress Notes (Signed)
Chief Complaint:   OBESITY Luis Russell is here to discuss his progress with his obesity treatment plan along with follow-up of his obesity related diagnoses. Luis Russell is on the Category 4 Plan and states he is following his eating plan approximately 50% of the time. Luis Russell states he has been doing some walking.   Today's visit was #: 22 Starting weight: 342 lbs Starting date: 03/24/2020 Today's weight: 325 lbs Today's date: 11/09/2021 Total lbs lost to date: 17 Total lbs lost since last in-office visit: 0  Interim History: Luis Russell did some celebration eating over the holidays, and he is working on getting back on track. He notes family support has declined and temptations are a challenge.  Subjective:   1. Type 2 diabetes mellitus with other specified complication, without long-term current use of insulin (HCC) Luis Russell was prescribed Mounjaro, but his insurance would not cover it without him being on Victoza first.  2. Vitamin D deficiency Luis Russell is stable on Vit D, and he is due for labs.  3. At risk for obstructive sleep apnea Luis Russell is at increased risk for sleep apnea due to obesity.   Assessment/Plan:   1. Type 2 diabetes mellitus with other specified complication, without long-term current use of insulin (HCC) Luis Russell will continue Victoza at 1.8 mg daily and we will refill for 1 month. We will check labs today. Good blood sugar control is important to decrease the likelihood of diabetic complications such as nephropathy, neuropathy, limb loss, blindness, coronary artery disease, and death. Intensive lifestyle modification including diet, exercise and weight loss are the first line of treatment for diabetes.   - CMP14+EGFR - Lipid Panel With LDL/HDL Ratio - Insulin, random - Hemoglobin A1c - liraglutide (VICTOZA) 18 MG/3ML SOPN; Inject 1.8 mg into the skin daily.  Dispense: 9 mL; Refill: 0  2. Vitamin D deficiency We will check labs today, and we will refill prescription  Vitamin D for 1 month. Luis Russell will follow-up for routine testing of Vitamin D, at least 2-3 times per year to avoid over-replacement.  - Vitamin D, Ergocalciferol, (DRISDOL) 1.25 MG (50000 UNIT) CAPS capsule; Take 1 capsule (50,000 Units total) by mouth every 7 (seven) days.  Dispense: 4 capsule; Refill: 0 - VITAMIN D 25 Hydroxy (Vit-D Deficiency, Fractures)  3. At risk for obstructive sleep apnea Luis Russell was given approximately 15 minutes of coronary artery disease prevention counseling today. He is 56 y.o. male and has risk factors for obstructive sleep apnea including obesity. We discussed intensive lifestyle modifications today with an emphasis on specific weight loss instructions and strategies.  Repetitive spaced learning was employed today to elicit superior memory formation and behavioral change.  4. Obesity with current BMI of 41.8 Luis Russell is currently in the action stage of change. As such, his goal is to continue with weight loss efforts. He has agreed to the Category 4 Plan.   Exercise goals: As is.  Behavioral modification strategies: meal planning and cooking strategies and dealing with family or coworker sabotage.  Steel has agreed to follow-up with our clinic in 3 to 4 weeks. He was informed of the importance of frequent follow-up visits to maximize his success with intensive lifestyle modifications for his multiple health conditions.   Luis Russell was informed we would discuss his lab results at his next visit unless there is a critical issue that needs to be addressed sooner. Luis Russell agreed to keep his next visit at the agreed upon time to discuss these results.  Objective:   Blood  pressure 103/67, pulse 86, temperature 98.3 F (36.8 C), height $RemoveBe'6\' 2"'laAJWURno$  (1.88 m), weight (!) 325 lb (147.4 kg), SpO2 94 %. Body mass index is 41.73 kg/m.  General: Cooperative, alert, well developed, in no acute distress. HEENT: Conjunctivae and lids unremarkable. Cardiovascular: Regular rhythm.   Lungs: Normal work of breathing. Neurologic: No focal deficits.   Lab Results  Component Value Date   CREATININE 0.98 11/09/2021   BUN 15 11/09/2021   NA 139 11/09/2021   K 4.3 11/09/2021   CL 101 11/09/2021   CO2 26 11/09/2021   Lab Results  Component Value Date   ALT 33 11/09/2021   AST 24 11/09/2021   ALKPHOS 58 11/09/2021   BILITOT 0.7 11/09/2021   Lab Results  Component Value Date   HGBA1C 6.2 (H) 11/09/2021   HGBA1C 5.6 12/08/2020   HGBA1C 5.7 (H) 07/13/2020   HGBA1C 7.0 (H) 03/24/2020   HGBA1C 5.8 (H) 06/08/2016   Lab Results  Component Value Date   INSULIN 23.1 11/09/2021   INSULIN 16.9 12/08/2020   INSULIN 22.1 07/13/2020   INSULIN 33.8 (H) 03/24/2020   Lab Results  Component Value Date   TSH 3.240 03/24/2020   Lab Results  Component Value Date   CHOL 224 (H) 11/09/2021   HDL 50 11/09/2021   LDLCALC 157 (H) 11/09/2021   TRIG 96 11/09/2021   CHOLHDL 4.5 12/08/2020   Lab Results  Component Value Date   VD25OH 34.9 11/09/2021   VD25OH 28.9 (L) 12/08/2020   VD25OH 33.2 07/13/2020   Lab Results  Component Value Date   WBC 7.4 03/24/2020   HGB 13.4 03/24/2020   HCT 41.1 03/24/2020   MCV 90 03/24/2020   PLT 210 03/24/2020   No results found for: IRON, TIBC, FERRITIN  Attestation Statements:   Reviewed by clinician on day of visit: allergies, medications, problem list, medical history, surgical history, family history, social history, and previous encounter notes.   I, Trixie Dredge, am acting as transcriptionist for Dennard Nip, MD.  I have reviewed the above documentation for accuracy and completeness, and I agree with the above. -  Dennard Nip, MD

## 2021-11-28 ENCOUNTER — Other Ambulatory Visit (INDEPENDENT_AMBULATORY_CARE_PROVIDER_SITE_OTHER): Payer: Self-pay | Admitting: Family Medicine

## 2021-11-28 DIAGNOSIS — E559 Vitamin D deficiency, unspecified: Secondary | ICD-10-CM

## 2021-11-28 NOTE — Telephone Encounter (Signed)
Pt last seen by Dr. Beasley.  

## 2021-12-07 ENCOUNTER — Encounter (INDEPENDENT_AMBULATORY_CARE_PROVIDER_SITE_OTHER): Payer: Self-pay | Admitting: Family Medicine

## 2021-12-07 ENCOUNTER — Ambulatory Visit (INDEPENDENT_AMBULATORY_CARE_PROVIDER_SITE_OTHER): Payer: BC Managed Care – PPO | Admitting: Family Medicine

## 2021-12-07 ENCOUNTER — Other Ambulatory Visit: Payer: Self-pay

## 2021-12-07 VITALS — BP 106/67 | HR 91 | Temp 98.1°F | Ht 74.0 in | Wt 323.0 lb

## 2021-12-07 DIAGNOSIS — E7849 Other hyperlipidemia: Secondary | ICD-10-CM

## 2021-12-07 DIAGNOSIS — Z6841 Body Mass Index (BMI) 40.0 and over, adult: Secondary | ICD-10-CM

## 2021-12-07 DIAGNOSIS — E1169 Type 2 diabetes mellitus with other specified complication: Secondary | ICD-10-CM

## 2021-12-07 DIAGNOSIS — G4709 Other insomnia: Secondary | ICD-10-CM | POA: Diagnosis not present

## 2021-12-07 DIAGNOSIS — E669 Obesity, unspecified: Secondary | ICD-10-CM

## 2021-12-07 DIAGNOSIS — Z7985 Long-term (current) use of injectable non-insulin antidiabetic drugs: Secondary | ICD-10-CM

## 2021-12-07 DIAGNOSIS — E559 Vitamin D deficiency, unspecified: Secondary | ICD-10-CM

## 2021-12-07 MED ORDER — VITAMIN D (ERGOCALCIFEROL) 1.25 MG (50000 UNIT) PO CAPS: 50000.0000 [IU] | ORAL_CAPSULE | ORAL | 0 refills | Status: DC

## 2021-12-07 MED ORDER — TRAZODONE HCL 100 MG PO TABS: ORAL_TABLET | ORAL | 0 refills | Status: DC

## 2021-12-07 NOTE — Progress Notes (Signed)
Chief Complaint:   OBESITY Luis Russell is here to discuss his progress with his obesity treatment plan along with follow-up of his obesity related diagnoses. Luis Russell is on the Category 4 Plan and states he is following his eating plan approximately 45% of the time. Luis Russell states he is walking the dog 7times per week.   Today's visit was #: 23 Starting weight: 342 lbs Starting date: 03/24/2020 Today's weight: 323 lbs Today's date: 12/07/2021 Total lbs lost to date: 19 Total lbs lost since last in-office visit: 2  Interim History: Luis Russell continues to work on diet, exercise, and weight loss. He struggles with meal planning at times. His hunger is mostly controlled.  Subjective:   1. Other insomnia Luis Russell is not sleeping as good. He is having a sleep study soon to investigate this. He notes loud snoring.  2. Vitamin D deficiency Luis Russell's Vit D level is not yet at goal.  3. Type 2 diabetes mellitus with other specified complication, without long-term current use of insulin (HCC) Luis Russell is working on diet and exercise, he has a problem with a pen malfunctioning.  4. Other hyperlipidemia Luis Russell's last LDL was elevated. He is not on a statin, and he is working on his diet.  Assessment/Plan:   1. Other insomnia The problem of recurrent insomnia was discussed. Orders and follow up as documented in patient record. Counseling: Intensive lifestyle modifications are the first line treatment for this issue. We discussed several lifestyle modifications today. Luis Russell agreed to start trazodone 100 mg qhs with no refills. He will continue to work on diet, exercise and weight loss efforts.   - traZODone (DESYREL) 100 MG tablet; TAKE 1 TABLET BY MOUTH EVERY DAY AT BEDTIME FOR SLEEP  Dispense: 30 tablet; Refill: 0  2. Vitamin D deficiency We will refill prescription Vitamin D for 1 month. Luis Russell will follow-up for routine testing of Vitamin D, at least 2-3 times per year to avoid over-replacement.  -  Vitamin D, Ergocalciferol, (DRISDOL) 1.25 MG (50000 UNIT) CAPS capsule; Take 1 capsule (50,000 Units total) by mouth every 7 (seven) days.  Dispense: 4 capsule; Refill: 0  3. Type 2 diabetes mellitus with other specified complication, without long-term current use of insulin (HCC) Luis Russell will continue Victoza and diet, and we will continue to follow. Good blood sugar control is important to decrease the likelihood of diabetic complications such as nephropathy, neuropathy, limb loss, blindness, coronary artery disease, and death. Intensive lifestyle modification including diet, exercise and weight loss are the first line of treatment for diabetes.  4. Other hyperlipidemia Cardiovascular risk and specific lipid/LDL goals reviewed. We discussed several lifestyle modifications today. Luis Russell will continue with diet and exercise, and we discussed statin and coronary artery disease risk especially with diabetes mellitus. Orders and follow up as documented in patient record.   5. Obesity with current BMI of 41.6 Luis Russell is currently in the action stage of change. As such, his goal is to continue with weight loss efforts. He has agreed to the Category 4 Plan.   Exercise goals: As is.  Behavioral modification strategies: increasing lean protein intake and meal planning and cooking strategies.  Luis Russell has agreed to follow-up with our clinic in 4 weeks. He was informed of the importance of frequent follow-up visits to maximize his success with intensive lifestyle modifications for his multiple health conditions.   Objective:   Blood pressure 106/67, pulse 91, temperature 98.1 F (36.7 C), height 6\' 2"  (1.88 m), weight (!) 323 lb (146.5 kg),  SpO2 94 %. Body mass index is 41.47 kg/m.  General: Cooperative, alert, well developed, in no acute distress. HEENT: Conjunctivae and lids unremarkable. Cardiovascular: Regular rhythm.  Lungs: Normal work of breathing. Neurologic: No focal deficits.   Lab Results   Component Value Date   CREATININE 0.98 11/09/2021   BUN 15 11/09/2021   NA 139 11/09/2021   K 4.3 11/09/2021   CL 101 11/09/2021   CO2 26 11/09/2021   Lab Results  Component Value Date   ALT 33 11/09/2021   AST 24 11/09/2021   ALKPHOS 58 11/09/2021   BILITOT 0.7 11/09/2021   Lab Results  Component Value Date   HGBA1C 6.2 (H) 11/09/2021   HGBA1C 5.6 12/08/2020   HGBA1C 5.7 (H) 07/13/2020   HGBA1C 7.0 (H) 03/24/2020   HGBA1C 5.8 (H) 06/08/2016   Lab Results  Component Value Date   INSULIN 23.1 11/09/2021   INSULIN 16.9 12/08/2020   INSULIN 22.1 07/13/2020   INSULIN 33.8 (H) 03/24/2020   Lab Results  Component Value Date   TSH 3.240 03/24/2020   Lab Results  Component Value Date   CHOL 224 (H) 11/09/2021   HDL 50 11/09/2021   LDLCALC 157 (H) 11/09/2021   TRIG 96 11/09/2021   CHOLHDL 4.5 12/08/2020   Lab Results  Component Value Date   VD25OH 34.9 11/09/2021   VD25OH 28.9 (L) 12/08/2020   VD25OH 33.2 07/13/2020   Lab Results  Component Value Date   WBC 7.4 03/24/2020   HGB 13.4 03/24/2020   HCT 41.1 03/24/2020   MCV 90 03/24/2020   PLT 210 03/24/2020   No results found for: IRON, TIBC, FERRITIN  Attestation Statements:   Reviewed by clinician on day of visit: allergies, medications, problem list, medical history, surgical history, family history, social history, and previous encounter notes.   I, Burt Knack, am acting as transcriptionist for [name], MD.  I have reviewed the above documentation for accuracy and completeness, and I agree with the above. -  [name], MD

## 2021-12-30 ENCOUNTER — Other Ambulatory Visit (INDEPENDENT_AMBULATORY_CARE_PROVIDER_SITE_OTHER): Payer: Self-pay | Admitting: Family Medicine

## 2021-12-30 DIAGNOSIS — E1169 Type 2 diabetes mellitus with other specified complication: Secondary | ICD-10-CM

## 2022-01-01 NOTE — Telephone Encounter (Signed)
Dr.Beasley 

## 2022-01-11 ENCOUNTER — Ambulatory Visit (INDEPENDENT_AMBULATORY_CARE_PROVIDER_SITE_OTHER): Payer: BC Managed Care – PPO | Admitting: Family Medicine

## 2022-01-11 ENCOUNTER — Other Ambulatory Visit: Payer: Self-pay

## 2022-01-11 ENCOUNTER — Encounter (INDEPENDENT_AMBULATORY_CARE_PROVIDER_SITE_OTHER): Payer: Self-pay | Admitting: Family Medicine

## 2022-01-11 VITALS — BP 116/77 | HR 85 | Temp 97.8°F | Ht 74.0 in | Wt 332.0 lb

## 2022-01-11 DIAGNOSIS — E66813 Obesity, class 3: Secondary | ICD-10-CM

## 2022-01-11 DIAGNOSIS — E669 Obesity, unspecified: Secondary | ICD-10-CM | POA: Diagnosis not present

## 2022-01-11 DIAGNOSIS — E559 Vitamin D deficiency, unspecified: Secondary | ICD-10-CM | POA: Diagnosis not present

## 2022-01-11 DIAGNOSIS — Z6841 Body Mass Index (BMI) 40.0 and over, adult: Secondary | ICD-10-CM

## 2022-01-11 DIAGNOSIS — R7303 Prediabetes: Secondary | ICD-10-CM | POA: Diagnosis not present

## 2022-01-11 DIAGNOSIS — G4733 Obstructive sleep apnea (adult) (pediatric): Secondary | ICD-10-CM | POA: Diagnosis not present

## 2022-01-11 MED ORDER — VITAMIN D (ERGOCALCIFEROL) 1.25 MG (50000 UNIT) PO CAPS: 50000.0000 [IU] | ORAL_CAPSULE | ORAL | 0 refills | Status: DC

## 2022-01-11 MED ORDER — INSULIN PEN NEEDLE 32G X 4 MM MISC: 0 refills | Status: DC

## 2022-01-15 NOTE — Progress Notes (Signed)
? ? ? ?Chief Complaint:  ? ?OBESITY ?Luis Russell is here to discuss his progress with his obesity treatment plan along with follow-up of his obesity related diagnoses. Luis Russell is on the Category 4 Plan and states he is following his eating plan approximately 40% of the time. Luis Russell states he is walking for 10 minutes 5 times per week. ? ?Today's visit was #: 24 ?Starting weight: 342 lbs ?Starting date: 03/24/2020 ?Today's weight: 332 lbs ?Today's date: 01/11/2022 ?Total lbs lost to date: 10 ?Total lbs lost since last in-office visit: 0 ? ?Interim History: Luis Russell hasn't been able to follow his plan as closely. He is trying to portion control and make smarter choices, but his weight is going in the wrong direction. His sleep has been poor for the last month. He is open to making changes. ? ?Subjective:  ? ?1. Pre-diabetes ?Luis Russell is on Victoza and he notes some increased nausea, but he is also gaining weight and visceral fat. ? ?2. OSA (obstructive sleep apnea) ?Luis Russell sleeps 5 hours most nights. He struggles to go to sleep and stay asleep. He states this has been most of his life, but worse recently.  ? ?3. Vitamin D deficiency ?Luis Russell is stable on Vit D, and he requests a refill today. ? ?Assessment/Plan:  ? ?1. Pre-diabetes ?Luis Russell is to get back on track with his eating plan, and continue Victoza to help decrease the risk of diabetes. We will refill pen needles for 1 month. ? ?- Insulin Pen Needle 32G X 4 MM MISC; Use daily with Victoza.  Dispense: 30 each; Refill: 0 ? ?2. OSA (obstructive sleep apnea) ?Intensive lifestyle modifications are the first line treatment for this issue. We discussed several lifestyle modifications today. Luis Russell is to use his CPAP regularly and the goal is to discontinue trazodone. He may need to see a sleep doctor if no improvement. He will continue to work on diet, exercise and weight loss efforts. We will continue to monitor. Orders and follow up as documented in patient record.  ? ?3.  Vitamin D deficiency ?We will refill prescription Vitamin D for 1 month. Luis Russell will follow-up for routine testing of Vitamin D, at least 2-3 times per year to avoid over-replacement. ? ?- Vitamin D, Ergocalciferol, (DRISDOL) 1.25 MG (50000 UNIT) CAPS capsule; Take 1 capsule (50,000 Units total) by mouth every 7 (seven) days.  Dispense: 4 capsule; Refill: 0 ? ?4. Obesity with current BMI of 42.6 ?Luis Russell is currently in the action stage of change. As such, his goal is to continue with weight loss efforts. He has agreed to change to following a lower carbohydrate, vegetable and lean protein rich diet plan.  ? ?Exercise goals: As is. ? ?Behavioral modification strategies: increasing water intake. ? ?Luis Russell has agreed to follow-up with our clinic in 4 weeks. He was informed of the importance of frequent follow-up visits to maximize his success with intensive lifestyle modifications for his multiple health conditions.  ? ?Objective:  ? ?Blood pressure 116/77, pulse 85, temperature 97.8 ?F (36.6 ?C), height 6\' 2"  (1.88 m), weight (!) 332 lb (150.6 kg), SpO2 94 %. ?Body mass index is 42.63 kg/m?. ? ?General: Cooperative, alert, well developed, in no acute distress. ?HEENT: Conjunctivae and lids unremarkable. ?Cardiovascular: Regular rhythm.  ?Lungs: Normal work of breathing. ?Neurologic: No focal deficits.  ? ?Lab Results  ?Component Value Date  ? CREATININE 0.98 11/09/2021  ? BUN 15 11/09/2021  ? NA 139 11/09/2021  ? K 4.3 11/09/2021  ? CL 101 11/09/2021  ?  CO2 26 11/09/2021  ? ?Lab Results  ?Component Value Date  ? ALT 33 11/09/2021  ? AST 24 11/09/2021  ? ALKPHOS 58 11/09/2021  ? BILITOT 0.7 11/09/2021  ? ?Lab Results  ?Component Value Date  ? HGBA1C 6.2 (H) 11/09/2021  ? HGBA1C 5.6 12/08/2020  ? HGBA1C 5.7 (H) 07/13/2020  ? HGBA1C 7.0 (H) 03/24/2020  ? HGBA1C 5.8 (H) 06/08/2016  ? ?Lab Results  ?Component Value Date  ? INSULIN 23.1 11/09/2021  ? INSULIN 16.9 12/08/2020  ? INSULIN 22.1 07/13/2020  ? INSULIN 33.8 (H)  03/24/2020  ? ?Lab Results  ?Component Value Date  ? TSH 3.240 03/24/2020  ? ?Lab Results  ?Component Value Date  ? CHOL 224 (H) 11/09/2021  ? HDL 50 11/09/2021  ? LDLCALC 157 (H) 11/09/2021  ? TRIG 96 11/09/2021  ? CHOLHDL 4.5 12/08/2020  ? ?Lab Results  ?Component Value Date  ? VD25OH 34.9 11/09/2021  ? VD25OH 28.9 (L) 12/08/2020  ? VD25OH 33.2 07/13/2020  ? ?Lab Results  ?Component Value Date  ? WBC 7.4 03/24/2020  ? HGB 13.4 03/24/2020  ? HCT 41.1 03/24/2020  ? MCV 90 03/24/2020  ? PLT 210 03/24/2020  ? ?No results found for: IRON, TIBC, FERRITIN ? ?Attestation Statements:  ? ?Reviewed by clinician on day of visit: allergies, medications, problem list, medical history, surgical history, family history, social history, and previous encounter notes. ? ? ?I, Burt Knack, am acting as transcriptionist for [name], MD. ? ?I have reviewed the above documentation for accuracy and completeness, and I agree with the above. -  [name], MD ? ? ?

## 2022-02-17 ENCOUNTER — Other Ambulatory Visit (INDEPENDENT_AMBULATORY_CARE_PROVIDER_SITE_OTHER): Payer: Self-pay | Admitting: Family Medicine

## 2022-02-17 DIAGNOSIS — E1169 Type 2 diabetes mellitus with other specified complication: Secondary | ICD-10-CM

## 2022-02-17 DIAGNOSIS — E559 Vitamin D deficiency, unspecified: Secondary | ICD-10-CM

## 2022-02-19 ENCOUNTER — Ambulatory Visit (INDEPENDENT_AMBULATORY_CARE_PROVIDER_SITE_OTHER): Payer: BC Managed Care – PPO | Admitting: Family Medicine

## 2022-02-19 VITALS — BP 118/71 | HR 85 | Temp 98.1°F | Ht 74.0 in | Wt 330.0 lb

## 2022-02-19 DIAGNOSIS — E669 Obesity, unspecified: Secondary | ICD-10-CM | POA: Diagnosis not present

## 2022-02-19 DIAGNOSIS — Z6841 Body Mass Index (BMI) 40.0 and over, adult: Secondary | ICD-10-CM | POA: Diagnosis not present

## 2022-02-19 DIAGNOSIS — E559 Vitamin D deficiency, unspecified: Secondary | ICD-10-CM

## 2022-02-19 DIAGNOSIS — E1169 Type 2 diabetes mellitus with other specified complication: Secondary | ICD-10-CM | POA: Diagnosis not present

## 2022-02-19 DIAGNOSIS — Z7985 Long-term (current) use of injectable non-insulin antidiabetic drugs: Secondary | ICD-10-CM

## 2022-02-19 MED ORDER — VICTOZA 18 MG/3ML ~~LOC~~ SOPN: 1.8000 mg | PEN_INJECTOR | Freq: Every day | SUBCUTANEOUS | 0 refills | Status: DC

## 2022-02-19 MED ORDER — VICTOZA 18 MG/3ML ~~LOC~~ SOPN: 1.8000 mg | PEN_INJECTOR | Freq: Every day | SUBCUTANEOUS | 1 refills | Status: DC

## 2022-02-19 MED ORDER — VITAMIN D (ERGOCALCIFEROL) 1.25 MG (50000 UNIT) PO CAPS: 50000.0000 [IU] | ORAL_CAPSULE | ORAL | 0 refills | Status: DC

## 2022-02-19 MED ORDER — VITAMIN D (ERGOCALCIFEROL) 1.25 MG (50000 UNIT) PO CAPS: 50000.0000 [IU] | ORAL_CAPSULE | ORAL | 1 refills | Status: DC

## 2022-02-28 NOTE — Progress Notes (Signed)
? ? ? ?Chief Complaint:  ? ?OBESITY ?Luis Russell is here to discuss his progress with his obesity treatment plan along with follow-up of his obesity related diagnoses. Luis Russell is on following a lower carbohydrate, vegetable and lean protein rich diet plan and states he is following his eating plan approximately 40% of the time. Luis Russell states he is doing yard work.  ? ?Today's visit was #: 25 ?Starting weight: 342 lbs ?Starting date: 03/24/2020 ?Today's weight: 330 lbs ?Today's date: 02/19/2022 ?Total lbs lost to date: 12 ?Total lbs lost since last in-office visit: 2 ? ?Interim History: Luis Russell has done well with his weight loss since last month. He is disappointed that is wasn't more however. He would like to change his eating plan as he has not been able to follow his Category 3 plan closely. ? ?Subjective:  ? ?1. Vitamin D deficiency ?Luis Russell is on Vitamin D, and he had labs done at his PCP recently.  ? ?2. Type 2 diabetes mellitus with other specified complication, without long-term current use of insulin (HCC) ?Luis Russell states labs with his PCP showed his HgA1c is controlled at 5.9. He has no problems with Victoza.  ? ?Assessment/Plan:  ? ?1. Vitamin D deficiency ?We will refill prescription Vitamin D for 2 months. Luis Russell will follow-up for routine testing of Vitamin D, at least 2-3 times per year to avoid over-replacement. ? ?- Vitamin D, Ergocalciferol, (DRISDOL) 1.25 MG (50000 UNIT) CAPS capsule; Take 1 capsule (50,000 Units total) by mouth every 7 (seven) days.  Dispense: 4 capsule; Refill: 1 ? ?2. Type 2 diabetes mellitus with other specified complication, without long-term current use of insulin (HCC) ?Luis Russell will continue Victoza, and we will refill for 2 months. ? ?- liraglutide (VICTOZA) 18 MG/3ML SOPN; Inject 1.8 mg into the skin daily.  Dispense: 9 mL; Refill: 1 ? ?3. Obesity with current BMI of 42.4 ?Luis Russell is currently in the action stage of change. As such, his goal is to continue with weight loss efforts. He  has agreed to following a lower carbohydrate, vegetable and lean protein rich diet plan.  ? ?Exercise goals: For substantial health benefits, adults should do at least 150 minutes (2 hours and 30 minutes) a week of moderate-intensity, or 75 minutes (1 hour and 15 minutes) a week of vigorous-intensity aerobic physical activity, or an equivalent combination of moderate- and vigorous-intensity aerobic activity. Aerobic activity should be performed in episodes of at least 10 minutes, and preferably, it should be spread throughout the week. Total carbs below 30 grams of protein.  ? ?Behavioral modification strategies: increasing lean protein intake. ? ?Luis Russell has agreed to follow-up with our clinic in 8 weeks. He was informed of the importance of frequent follow-up visits to maximize his success with intensive lifestyle modifications for his multiple health conditions.  ? ?Objective:  ? ?Blood pressure 118/71, pulse 85, temperature 98.1 ?F (36.7 ?C), height 6\' 2"  (1.88 m), weight (!) 330 lb (149.7 kg), SpO2 94 %. ?Body mass index is 42.37 kg/m?. ? ?General: Cooperative, alert, well developed, in no acute distress. ?HEENT: Conjunctivae and lids unremarkable. ?Cardiovascular: Regular rhythm.  ?Lungs: Normal work of breathing. ?Neurologic: No focal deficits.  ? ?Lab Results  ?Component Value Date  ? CREATININE 0.98 11/09/2021  ? BUN 15 11/09/2021  ? NA 139 11/09/2021  ? K 4.3 11/09/2021  ? CL 101 11/09/2021  ? CO2 26 11/09/2021  ? ?Lab Results  ?Component Value Date  ? ALT 33 11/09/2021  ? AST 24 11/09/2021  ? ALKPHOS  58 11/09/2021  ? BILITOT 0.7 11/09/2021  ? ?Lab Results  ?Component Value Date  ? HGBA1C 6.2 (H) 11/09/2021  ? HGBA1C 5.6 12/08/2020  ? HGBA1C 5.7 (H) 07/13/2020  ? HGBA1C 7.0 (H) 03/24/2020  ? HGBA1C 5.8 (H) 06/08/2016  ? ?Lab Results  ?Component Value Date  ? INSULIN 23.1 11/09/2021  ? INSULIN 16.9 12/08/2020  ? INSULIN 22.1 07/13/2020  ? INSULIN 33.8 (H) 03/24/2020  ? ?Lab Results  ?Component Value Date  ?  TSH 3.240 03/24/2020  ? ?Lab Results  ?Component Value Date  ? CHOL 224 (H) 11/09/2021  ? HDL 50 11/09/2021  ? LDLCALC 157 (H) 11/09/2021  ? TRIG 96 11/09/2021  ? CHOLHDL 4.5 12/08/2020  ? ?Lab Results  ?Component Value Date  ? VD25OH 34.9 11/09/2021  ? VD25OH 28.9 (L) 12/08/2020  ? VD25OH 33.2 07/13/2020  ? ?Lab Results  ?Component Value Date  ? WBC 7.4 03/24/2020  ? HGB 13.4 03/24/2020  ? HCT 41.1 03/24/2020  ? MCV 90 03/24/2020  ? PLT 210 03/24/2020  ? ?No results found for: IRON, TIBC, FERRITIN ? ?Attestation Statements:  ? ?Reviewed by clinician on day of visit: allergies, medications, problem list, medical history, surgical history, family history, social history, and previous encounter notes. ? ? ?I, Burt Knack, am acting as transcriptionist for [name], MD. ? ?I have reviewed the above documentation for accuracy and completeness, and I agree with the above. -  [name], MD ? ? ?

## 2022-04-22 ENCOUNTER — Encounter (INDEPENDENT_AMBULATORY_CARE_PROVIDER_SITE_OTHER): Payer: Self-pay

## 2022-04-23 ENCOUNTER — Ambulatory Visit (INDEPENDENT_AMBULATORY_CARE_PROVIDER_SITE_OTHER): Payer: BC Managed Care – PPO | Admitting: Family Medicine

## 2022-05-21 ENCOUNTER — Ambulatory Visit (INDEPENDENT_AMBULATORY_CARE_PROVIDER_SITE_OTHER): Payer: BC Managed Care – PPO | Admitting: Family Medicine

## 2022-05-21 ENCOUNTER — Encounter (INDEPENDENT_AMBULATORY_CARE_PROVIDER_SITE_OTHER): Payer: Self-pay | Admitting: Family Medicine

## 2022-05-21 VITALS — BP 115/62 | HR 81 | Temp 98.2°F | Ht 74.0 in | Wt 337.0 lb

## 2022-05-21 DIAGNOSIS — E669 Obesity, unspecified: Secondary | ICD-10-CM

## 2022-05-21 DIAGNOSIS — E1169 Type 2 diabetes mellitus with other specified complication: Secondary | ICD-10-CM | POA: Diagnosis not present

## 2022-05-21 DIAGNOSIS — Z6841 Body Mass Index (BMI) 40.0 and over, adult: Secondary | ICD-10-CM

## 2022-05-21 DIAGNOSIS — E559 Vitamin D deficiency, unspecified: Secondary | ICD-10-CM | POA: Diagnosis not present

## 2022-05-21 DIAGNOSIS — Z7985 Long-term (current) use of injectable non-insulin antidiabetic drugs: Secondary | ICD-10-CM

## 2022-05-21 MED ORDER — VITAMIN D (ERGOCALCIFEROL) 1.25 MG (50000 UNIT) PO CAPS: 50000.0000 [IU] | ORAL_CAPSULE | ORAL | 0 refills | Status: DC

## 2022-05-21 MED ORDER — SEMAGLUTIDE (1 MG/DOSE) 4 MG/3ML ~~LOC~~ SOPN: 1.0000 mg | PEN_INJECTOR | SUBCUTANEOUS | 0 refills | Status: DC

## 2022-05-28 NOTE — Progress Notes (Unsigned)
Chief Complaint:   OBESITY Luis Russell is here to discuss his progress with his obesity treatment plan along with follow-up of his obesity related diagnoses. Luis Russell is on following a lower carbohydrate, vegetable and lean protein rich diet plan and states he is following his eating plan approximately 0% of the time. Luis Russell states he is doing 0 minutes 0 times per week.  Today's visit was #: 26 Starting weight: 342 lbs Starting date: 03/24/2020 Today's weight: 337 lbs Today's date: 05/21/2022 Total lbs lost to date: 5 Total lbs lost since last in-office visit: 0  Interim History: Luis Russell's last visit was approximately 3 months ago.  He has been dealing with a persistent sinus infection and he has not been able to concentrate on his weight loss.  In addition, his wife retired recently so there has been more eating out and celebration eating.  Subjective:   1. Type 2 diabetes mellitus with other specified complication, unspecified whether long term insulin use (HCC) Luis Russell is struggling more with his diet, exercise, and weight loss.  He has been on Victoza but he feels his polyphagia has increased.  2. Vitamin D deficiency Luis Russell is stable on vitamin D, and he requests a refill.  Assessment/Plan:   1. Type 2 diabetes mellitus with other specified complication, unspecified whether long term insulin use (HCC) Luis Russell agreed to discontinue Victoza, and start Luis Russell 1 mg once weekly with a 90-day supply.  - Semaglutide, 1 MG/DOSE, 4 MG/3ML SOPN; Inject 1 mg as directed once a week.  Dispense: 9 mL; Refill: 0  2. Vitamin D deficiency Luis Russell will continue prescription vitamin D 50,000 units once every 7 days, and we will refill for 90 days.  - Vitamin D, Ergocalciferol, (DRISDOL) 1.25 MG (50000 UNIT) CAPS capsule; Take 1 capsule (50,000 Units total) by mouth every 7 (seven) days.  Dispense: 12 capsule; Refill: 0  3. Obesity, Current BMI 43.3 Luis Russell is currently in the action stage of  change. As such, his goal is to continue with weight loss efforts. He has agreed to following a lower carbohydrate, vegetable and lean protein rich diet plan.   Behavioral modification strategies: increasing water intake, decreasing eating out, and meal planning and cooking strategies.  Luis Russell has agreed to follow-up with our clinic in 3 to 4 weeks. He was informed of the importance of frequent follow-up visits to maximize his success with intensive lifestyle modifications for his multiple health conditions.   Objective:   Blood pressure 115/62, pulse 81, temperature 98.2 F (36.8 C), height 6\' 2"  (1.88 m), weight (!) 337 lb (152.9 kg), SpO2 90 %. Body mass index is 43.27 kg/m.  General: Cooperative, alert, well developed, in no acute distress. HEENT: Conjunctivae and lids unremarkable. Cardiovascular: Regular rhythm.  Lungs: Normal work of breathing. Neurologic: No focal deficits.   Lab Results  Component Value Date   CREATININE 0.98 11/09/2021   BUN 15 11/09/2021   NA 139 11/09/2021   K 4.3 11/09/2021   CL 101 11/09/2021   CO2 26 11/09/2021   Lab Results  Component Value Date   ALT 33 11/09/2021   AST 24 11/09/2021   ALKPHOS 58 11/09/2021   BILITOT 0.7 11/09/2021   Lab Results  Component Value Date   HGBA1C 6.2 (H) 11/09/2021   HGBA1C 5.6 12/08/2020   HGBA1C 5.7 (H) 07/13/2020   HGBA1C 7.0 (H) 03/24/2020   HGBA1C 5.8 (H) 06/08/2016   Lab Results  Component Value Date   INSULIN 23.1 11/09/2021   INSULIN  16.9 12/08/2020   INSULIN 22.1 07/13/2020   INSULIN 33.8 (H) 03/24/2020   Lab Results  Component Value Date   TSH 3.240 03/24/2020   Lab Results  Component Value Date   CHOL 224 (H) 11/09/2021   HDL 50 11/09/2021   LDLCALC 157 (H) 11/09/2021   TRIG 96 11/09/2021   CHOLHDL 4.5 12/08/2020   Lab Results  Component Value Date   VD25OH 34.9 11/09/2021   VD25OH 28.9 (L) 12/08/2020   VD25OH 33.2 07/13/2020   Lab Results  Component Value Date   WBC 7.4  03/24/2020   HGB 13.4 03/24/2020   HCT 41.1 03/24/2020   MCV 90 03/24/2020   PLT 210 03/24/2020   No results found for: "IRON", "TIBC", "FERRITIN"  Attestation Statements:   Reviewed by clinician on day of visit: allergies, medications, problem list, medical history, surgical history, family history, social history, and previous encounter notes.   I, Burt Knack, am acting as transcriptionist for [name], MD.  I have reviewed the above documentation for accuracy and completeness, and I agree with the above. -  [name], MD

## 2022-06-06 ENCOUNTER — Encounter (INDEPENDENT_AMBULATORY_CARE_PROVIDER_SITE_OTHER): Payer: Self-pay

## 2022-06-26 ENCOUNTER — Encounter (INDEPENDENT_AMBULATORY_CARE_PROVIDER_SITE_OTHER): Payer: Self-pay | Admitting: Family Medicine

## 2022-06-26 ENCOUNTER — Ambulatory Visit (INDEPENDENT_AMBULATORY_CARE_PROVIDER_SITE_OTHER): Payer: Managed Care, Other (non HMO) | Admitting: Family Medicine

## 2022-06-26 VITALS — BP 103/67 | HR 82 | Temp 98.1°F | Ht 74.0 in | Wt 329.0 lb

## 2022-06-26 DIAGNOSIS — Z6841 Body Mass Index (BMI) 40.0 and over, adult: Secondary | ICD-10-CM

## 2022-06-26 DIAGNOSIS — F32A Depression, unspecified: Secondary | ICD-10-CM | POA: Insufficient documentation

## 2022-06-26 DIAGNOSIS — E1169 Type 2 diabetes mellitus with other specified complication: Secondary | ICD-10-CM

## 2022-06-26 DIAGNOSIS — F3289 Other specified depressive episodes: Secondary | ICD-10-CM

## 2022-06-26 DIAGNOSIS — Z7985 Long-term (current) use of injectable non-insulin antidiabetic drugs: Secondary | ICD-10-CM

## 2022-06-26 DIAGNOSIS — E669 Obesity, unspecified: Secondary | ICD-10-CM | POA: Diagnosis not present

## 2022-07-03 NOTE — Progress Notes (Signed)
Chief Complaint:   OBESITY Luis Russell is here to discuss his progress with his obesity treatment plan along with follow-up of his obesity related diagnoses. Luis Russell is on following a lower carbohydrate, vegetable and lean protein rich diet plan and states he is following his eating plan approximately 50% of the time. Luis Russell states he is walking the dog for 10-15 minutes 3-4 times per week.  Today's visit was #: 27 Starting weight: 342 lbs Starting date: 03/24/2020 Today's weight: 329 lbs Today's date: 06/26/2022 Total lbs lost to date: 13 Total lbs lost since last in-office visit: 8  Interim History: Luis Russell has done well with weight loss. He has stopped drinking soda and he has increased his water intake. He is decreasing his snacking, and still working on increasing protein and trying to eat vegetables. He has been doing a lot of yard and deck work over the weekends.   Subjective:   1. Type 2 diabetes mellitus with other specified complication, unspecified whether long term insulin use (HCC) Luis Russell is on Ozempic and he is doing well with his diet and weight loss. He is due for labs, but he is not fasting this morning.   2. Other depression, emotional eating behaviors Luis Russell is stable on Wellbutrin and Trintellix. He is doing well with decreasing emotional eating behaviors and snacking. No side effects were noted.   Assessment/Plan:   1. Type 2 diabetes mellitus with other specified complication, unspecified whether long term insulin use (HCC) Luis Russell will continue with his diet and exercise, and Ozempic. We will recheck labs at his next visit.  2. Other depression, emotional eating behaviors Luis Russell will continue his medications, and we will continue to manage his medications.   3. Obesity, Current BMI 42.3 Luis Russell is currently in the action stage of change. As such, his goal is to continue with weight loss efforts. He has agreed to following a lower carbohydrate, vegetable and lean  protein rich diet plan.   We will recheck fasting labs at her next visit.  Exercise goals: As is.  Behavioral modification strategies: increasing lean protein intake and no skipping meals.  Raad has agreed to follow-up with our clinic in 4 weeks. He was informed of the importance of frequent follow-up visits to maximize his success with intensive lifestyle modifications for his multiple health conditions.   Objective:   Blood pressure 103/67, pulse 82, temperature 98.1 F (36.7 C), height 6\' 2"  (1.88 m), weight (!) 329 lb (149.2 kg), SpO2 93 %. Body mass index is 42.24 kg/m.  General: Cooperative, alert, well developed, in no acute distress. HEENT: Conjunctivae and lids unremarkable. Cardiovascular: Regular rhythm.  Lungs: Normal work of breathing. Neurologic: No focal deficits.   Lab Results  Component Value Date   CREATININE 0.98 11/09/2021   BUN 15 11/09/2021   NA 139 11/09/2021   K 4.3 11/09/2021   CL 101 11/09/2021   CO2 26 11/09/2021   Lab Results  Component Value Date   ALT 33 11/09/2021   AST 24 11/09/2021   ALKPHOS 58 11/09/2021   BILITOT 0.7 11/09/2021   Lab Results  Component Value Date   HGBA1C 6.2 (H) 11/09/2021   HGBA1C 5.6 12/08/2020   HGBA1C 5.7 (H) 07/13/2020   HGBA1C 7.0 (H) 03/24/2020   HGBA1C 5.8 (H) 06/08/2016   Lab Results  Component Value Date   INSULIN 23.1 11/09/2021   INSULIN 16.9 12/08/2020   INSULIN 22.1 07/13/2020   INSULIN 33.8 (H) 03/24/2020   Lab Results  Component  Value Date   TSH 3.240 03/24/2020   Lab Results  Component Value Date   CHOL 224 (H) 11/09/2021   HDL 50 11/09/2021   LDLCALC 157 (H) 11/09/2021   TRIG 96 11/09/2021   CHOLHDL 4.5 12/08/2020   Lab Results  Component Value Date   VD25OH 34.9 11/09/2021   VD25OH 28.9 (L) 12/08/2020   VD25OH 33.2 07/13/2020   Lab Results  Component Value Date   WBC 7.4 03/24/2020   HGB 13.4 03/24/2020   HCT 41.1 03/24/2020   MCV 90 03/24/2020   PLT 210 03/24/2020    No results found for: "IRON", "TIBC", "FERRITIN"  Attestation Statements:   Reviewed by clinician on day of visit: allergies, medications, problem list, medical history, surgical history, family history, social history, and previous encounter notes.   I, Burt Knack, am acting as transcriptionist for [name], MD.  I have reviewed the above documentation for accuracy and completeness, and I agree with the above. -  [name], MD

## 2022-07-19 ENCOUNTER — Encounter (INDEPENDENT_AMBULATORY_CARE_PROVIDER_SITE_OTHER): Payer: Self-pay | Admitting: Family Medicine

## 2022-07-30 ENCOUNTER — Ambulatory Visit (INDEPENDENT_AMBULATORY_CARE_PROVIDER_SITE_OTHER): Payer: Managed Care, Other (non HMO) | Admitting: Family Medicine

## 2022-07-30 ENCOUNTER — Encounter (INDEPENDENT_AMBULATORY_CARE_PROVIDER_SITE_OTHER): Payer: Self-pay | Admitting: Family Medicine

## 2022-07-30 VITALS — BP 107/66 | HR 89 | Temp 98.6°F | Ht 74.0 in | Wt 329.0 lb

## 2022-07-30 DIAGNOSIS — F3289 Other specified depressive episodes: Secondary | ICD-10-CM

## 2022-07-30 DIAGNOSIS — Z6841 Body Mass Index (BMI) 40.0 and over, adult: Secondary | ICD-10-CM

## 2022-07-30 DIAGNOSIS — E782 Mixed hyperlipidemia: Secondary | ICD-10-CM

## 2022-07-30 DIAGNOSIS — E669 Obesity, unspecified: Secondary | ICD-10-CM

## 2022-07-30 DIAGNOSIS — E1169 Type 2 diabetes mellitus with other specified complication: Secondary | ICD-10-CM

## 2022-07-30 DIAGNOSIS — E559 Vitamin D deficiency, unspecified: Secondary | ICD-10-CM

## 2022-07-30 MED ORDER — VORTIOXETINE HBR 10 MG PO TABS: 10.0000 mg | ORAL_TABLET | Freq: Every day | ORAL | 0 refills | Status: DC

## 2022-07-30 MED ORDER — VITAMIN D (ERGOCALCIFEROL) 1.25 MG (50000 UNIT) PO CAPS: 50000.0000 [IU] | ORAL_CAPSULE | ORAL | 0 refills | Status: DC

## 2022-07-31 LAB — CMP14+EGFR
ALT: 31 IU/L (ref 0–44)
AST: 22 IU/L (ref 0–40)
Albumin/Globulin Ratio: 1.8 (ref 1.2–2.2)
Albumin: 4.1 g/dL (ref 3.8–4.9)
Alkaline Phosphatase: 58 IU/L (ref 44–121)
BUN/Creatinine Ratio: 18 (ref 9–20)
BUN: 17 mg/dL (ref 6–24)
Bilirubin Total: 0.8 mg/dL (ref 0.0–1.2)
CO2: 21 mmol/L (ref 20–29)
Calcium: 9.1 mg/dL (ref 8.7–10.2)
Chloride: 99 mmol/L (ref 96–106)
Creatinine, Ser: 0.97 mg/dL (ref 0.76–1.27)
Globulin, Total: 2.3 g/dL (ref 1.5–4.5)
Glucose: 109 mg/dL — ABNORMAL HIGH (ref 70–99)
Potassium: 4.1 mmol/L (ref 3.5–5.2)
Sodium: 136 mmol/L (ref 134–144)
Total Protein: 6.4 g/dL (ref 6.0–8.5)
eGFR: 90 mL/min/{1.73_m2} (ref >59)

## 2022-07-31 LAB — VITAMIN D 25 HYDROXY (VIT D DEFICIENCY, FRACTURES): Vit D, 25-Hydroxy: 35.5 ng/mL (ref 30.0–100.0)

## 2022-07-31 LAB — LIPID PANEL WITH LDL/HDL RATIO
Cholesterol, Total: 196 mg/dL (ref 100–199)
HDL: 40 mg/dL (ref >39)
LDL Chol Calc (NIH): 136 mg/dL — ABNORMAL HIGH (ref 0–99)
LDL/HDL Ratio: 3.4 ratio (ref 0.0–3.6)
Triglycerides: 108 mg/dL (ref 0–149)
VLDL Cholesterol Cal: 20 mg/dL (ref 5–40)

## 2022-07-31 LAB — VITAMIN B12: Vitamin B-12: 496 pg/mL (ref 232–1245)

## 2022-07-31 LAB — INSULIN, RANDOM: INSULIN: 37.2 u[IU]/mL — ABNORMAL HIGH (ref 2.6–24.9)

## 2022-07-31 LAB — HEMOGLOBIN A1C
Est. average glucose Bld gHb Est-mCnc: 126 mg/dL
Hgb A1c MFr Bld: 6 % — ABNORMAL HIGH (ref 4.8–5.6)

## 2022-07-31 NOTE — Progress Notes (Signed)
Chief Complaint:   OBESITY Luis Russell is here to discuss his progress with his obesity treatment plan along with follow-up of his obesity related diagnoses. Luis Russell is on following a lower carbohydrate, vegetable and lean protein rich diet plan and states he is following his eating plan approximately 50% of the time. Luis Russell states he is active while doing yard work.    Today's visit was #: 28 Starting weight: 342 lbs Starting date: 5/272021 Today's weight: 329 lbs Today's date: 07/30/2022 Total lbs lost to date: 13 Total lbs lost since last in-office visit: 0  Interim History: Luis Russell has done well with maintaining his weight loss, but not able to follow his plan closely. 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   Subjective:   1. Type 2 diabetes mellitus with other specified complication, without long-term current use of insulin (HCC) Luis Russell is working on his diet, and he notes decreased polyphagia. He denies nausea or vomiting.   2. Mixed hyperlipidemia Luis Russell's last cholesterol levels were not at goal.   3. Vitamin D deficiency Luis Russell is on Vitamin D, and he is due for labs.   4. Other depression Luis Russell is on Trintellix, and he has significant stress at his job.   Assessment/Plan:   1. Type 2 diabetes mellitus with other specified complication, without long-term current use of insulin (HCC) We will check labs today, and Rajohn will continue his Ozempic.   - CMP14+EGFR - Vitamin B12 - Insulin, random - Hemoglobin A1c  2. Mixed hyperlipidemia We will check labs today. Cardiovascular risk and specific lipid/LDL goals reviewed.  We discussed several lifestyle modifications today and Luis Russell will continue to work on diet, exercise and weight loss efforts. Orders and follow up as documented in patient record.   - Lipid Panel With LDL/HDL Ratio  3. Vitamin D deficiency We will check labs today, and we will  refill prescription Vitamin D for 90 days. Luis Russell will follow-up for routine testing of Vitamin D, at least 2-3 times per year to avoid over-replacement.  - Vitamin D, Ergocalciferol, (DRISDOL) 1.25 MG (50000 UNIT) CAPS capsule; Take 1 capsule (50,000 Units total) by mouth every 7 (seven) days.  Dispense: 12 capsule; Refill: 0 - VITAMIN D 25 Hydroxy (Vit-D Deficiency, Fractures)  4. Other depression Luis Russell will continue Trintellix 10 mg once daily, and we will refill for 1 month.   - vortioxetine HBr (TRINTELLIX) 10 MG TABS tablet; Take 1 tablet (10 mg total) by mouth daily.  Dispense: 30 tablet; Refill: 0  5. Obesity, Current BMI 42.2 Luis Russell is currently in the action stage of change. As such, his goal is to continue with weight loss efforts. He has agreed to the Category 3 Plan.   Exercise goals: As is.   Behavioral modification strategies: increasing lean protein intake and no skipping meals.  Luis Russell has agreed to follow-up with our clinic in 4 weeks. He was informed of the importance of frequent follow-up visits to maximize his success with intensive lifestyle modifications for his multiple health conditions.   Objective:   Blood pressure 107/66, pulse 89, temperature 98.6 F (37 C), height $RemoveBe'6\' 2"'PCeTFafRy$  (1.88 m), weight (!) 329 lb (149.2 kg), SpO2 93 %. Body mass index is 42.24 kg/m.  General: Cooperative, alert, well developed, in no acute distress. HEENT: Conjunctivae and lids unremarkable. Cardiovascular: Regular rhythm.  Lungs: Normal work of breathing. Neurologic: No focal deficits.   Lab Results  Component Value  Date   CREATININE 0.97 07/30/2022   BUN 17 07/30/2022   NA 136 07/30/2022   K 4.1 07/30/2022   CL 99 07/30/2022   CO2 21 07/30/2022   Lab Results  Component Value Date   ALT 31 07/30/2022   AST 22 07/30/2022   ALKPHOS 58 07/30/2022   BILITOT 0.8 07/30/2022   Lab Results  Component Value Date   HGBA1C 6.0 (H) 07/30/2022   HGBA1C 6.2 (H) 11/09/2021    HGBA1C 5.6 12/08/2020   HGBA1C 5.7 (H) 07/13/2020   HGBA1C 7.0 (H) 03/24/2020   Lab Results  Component Value Date   INSULIN 37.2 (H) 07/30/2022   INSULIN 23.1 11/09/2021   INSULIN 16.9 12/08/2020   INSULIN 22.1 07/13/2020   INSULIN 33.8 (H) 03/24/2020   Lab Results  Component Value Date   TSH 3.240 03/24/2020   Lab Results  Component Value Date   CHOL 196 07/30/2022   HDL 40 07/30/2022   LDLCALC 136 (H) 07/30/2022   TRIG 108 07/30/2022   CHOLHDL 4.5 12/08/2020   Lab Results  Component Value Date   VD25OH 35.5 07/30/2022   VD25OH 34.9 11/09/2021   VD25OH 28.9 (L) 12/08/2020   Lab Results  Component Value Date   WBC 7.4 03/24/2020   HGB 13.4 03/24/2020   HCT 41.1 03/24/2020   MCV 90 03/24/2020   PLT 210 03/24/2020   No results found for: "IRON", "TIBC", "FERRITIN"  Attestation Statements:   Reviewed by clinician on day of visit: allergies, medications, problem list, medical history, surgical history, family history, social history, and previous encounter notes.   I, Trixie Dredge, am acting as transcriptionist for Dennard Nip, MD.  I have reviewed the above documentation for accuracy and completeness, and I agree with the above. -  Dennard Nip, MD

## 2022-08-10 ENCOUNTER — Other Ambulatory Visit (INDEPENDENT_AMBULATORY_CARE_PROVIDER_SITE_OTHER): Payer: Self-pay | Admitting: Family Medicine

## 2022-08-10 DIAGNOSIS — E1169 Type 2 diabetes mellitus with other specified complication: Secondary | ICD-10-CM

## 2022-08-12 ENCOUNTER — Other Ambulatory Visit (INDEPENDENT_AMBULATORY_CARE_PROVIDER_SITE_OTHER): Payer: Self-pay | Admitting: Family Medicine

## 2022-08-12 DIAGNOSIS — E1169 Type 2 diabetes mellitus with other specified complication: Secondary | ICD-10-CM

## 2022-08-15 ENCOUNTER — Other Ambulatory Visit (INDEPENDENT_AMBULATORY_CARE_PROVIDER_SITE_OTHER): Payer: Self-pay | Admitting: Family Medicine

## 2022-08-15 DIAGNOSIS — E1169 Type 2 diabetes mellitus with other specified complication: Secondary | ICD-10-CM

## 2022-08-16 ENCOUNTER — Telehealth (INDEPENDENT_AMBULATORY_CARE_PROVIDER_SITE_OTHER): Payer: Self-pay | Admitting: Family Medicine

## 2022-08-16 ENCOUNTER — Encounter (INDEPENDENT_AMBULATORY_CARE_PROVIDER_SITE_OTHER): Payer: Self-pay | Admitting: Family Medicine

## 2022-08-16 DIAGNOSIS — E1169 Type 2 diabetes mellitus with other specified complication: Secondary | ICD-10-CM

## 2022-08-16 NOTE — Telephone Encounter (Signed)
Pt called stating that his Ozempic refill has not been received by his pharmacy. Pt stated that it has been requested twice. Pt also stated that it has been denied by our office. Pt wants a call back today at his cell number as he is out of medication.

## 2022-08-17 ENCOUNTER — Encounter (INDEPENDENT_AMBULATORY_CARE_PROVIDER_SITE_OTHER): Payer: Self-pay | Admitting: Family Medicine

## 2022-08-17 ENCOUNTER — Other Ambulatory Visit (INDEPENDENT_AMBULATORY_CARE_PROVIDER_SITE_OTHER): Payer: Self-pay | Admitting: Family Medicine

## 2022-08-17 DIAGNOSIS — E1169 Type 2 diabetes mellitus with other specified complication: Secondary | ICD-10-CM

## 2022-08-20 ENCOUNTER — Encounter (INDEPENDENT_AMBULATORY_CARE_PROVIDER_SITE_OTHER): Payer: Self-pay | Admitting: Family Medicine

## 2022-08-20 MED ORDER — SEMAGLUTIDE (1 MG/DOSE) 4 MG/3ML ~~LOC~~ SOPN: 1.0000 mg | PEN_INJECTOR | SUBCUTANEOUS | 0 refills | Status: DC

## 2022-08-20 NOTE — Telephone Encounter (Signed)
Please refer to encounter dated 08/20/2022

## 2022-08-20 NOTE — Telephone Encounter (Signed)
Please refer to encounter dated 08/20/2022, patient sent multiple messages.

## 2022-08-20 NOTE — Telephone Encounter (Signed)
Please refer to message dated 08/20/2022

## 2022-08-21 ENCOUNTER — Encounter (INDEPENDENT_AMBULATORY_CARE_PROVIDER_SITE_OTHER): Payer: Self-pay | Admitting: Family Medicine

## 2022-08-21 NOTE — Telephone Encounter (Signed)
Message sent, Rx, sent

## 2022-08-21 NOTE — Telephone Encounter (Signed)
Please check PA, Thanks

## 2022-08-21 NOTE — Telephone Encounter (Signed)
Prior authorization was already started for Ozempic. Waiting on a response from insurance. Will notify patient and provider once a response is received.

## 2022-08-22 ENCOUNTER — Encounter (INDEPENDENT_AMBULATORY_CARE_PROVIDER_SITE_OTHER): Payer: Self-pay

## 2022-08-22 ENCOUNTER — Telehealth (INDEPENDENT_AMBULATORY_CARE_PROVIDER_SITE_OTHER): Payer: Self-pay | Admitting: Family Medicine

## 2022-08-22 NOTE — Telephone Encounter (Signed)
Dr. Leafy Ro - Prior authorization approved for Ozempic. Effective: 08/21/2022 - 08/22/2023. Patient sent approval message via mychart.

## 2022-08-24 ENCOUNTER — Encounter (INDEPENDENT_AMBULATORY_CARE_PROVIDER_SITE_OTHER): Payer: Self-pay | Admitting: Family Medicine

## 2022-08-24 DIAGNOSIS — E559 Vitamin D deficiency, unspecified: Secondary | ICD-10-CM

## 2022-08-27 MED ORDER — VITAMIN D (ERGOCALCIFEROL) 1.25 MG (50000 UNIT) PO CAPS: 50000.0000 [IU] | ORAL_CAPSULE | ORAL | 0 refills | Status: DC

## 2022-08-27 NOTE — Telephone Encounter (Signed)
Add insurance to August visit and resent back to billing.

## 2022-09-03 ENCOUNTER — Encounter (INDEPENDENT_AMBULATORY_CARE_PROVIDER_SITE_OTHER): Payer: Self-pay | Admitting: Family Medicine

## 2022-09-03 DIAGNOSIS — E1169 Type 2 diabetes mellitus with other specified complication: Secondary | ICD-10-CM

## 2022-09-04 NOTE — Telephone Encounter (Signed)
Ok to send in 3 month to HT x 1

## 2022-09-05 MED ORDER — SEMAGLUTIDE (1 MG/DOSE) 4 MG/3ML ~~LOC~~ SOPN
1.0000 mg | PEN_INJECTOR | SUBCUTANEOUS | 0 refills | Status: DC
  Filled 2022-09-11: qty 3, 28d supply, fill #0
  Filled 2022-10-05: qty 3, 28d supply, fill #1

## 2022-09-10 ENCOUNTER — Other Ambulatory Visit (HOSPITAL_BASED_OUTPATIENT_CLINIC_OR_DEPARTMENT_OTHER): Payer: Self-pay

## 2022-09-10 ENCOUNTER — Encounter (INDEPENDENT_AMBULATORY_CARE_PROVIDER_SITE_OTHER): Payer: Self-pay | Admitting: Family Medicine

## 2022-09-11 ENCOUNTER — Other Ambulatory Visit (HOSPITAL_BASED_OUTPATIENT_CLINIC_OR_DEPARTMENT_OTHER): Payer: Self-pay

## 2022-09-12 ENCOUNTER — Other Ambulatory Visit (INDEPENDENT_AMBULATORY_CARE_PROVIDER_SITE_OTHER): Payer: Self-pay

## 2022-09-12 ENCOUNTER — Other Ambulatory Visit (HOSPITAL_BASED_OUTPATIENT_CLINIC_OR_DEPARTMENT_OTHER): Payer: Self-pay

## 2022-09-12 DIAGNOSIS — E1169 Type 2 diabetes mellitus with other specified complication: Secondary | ICD-10-CM

## 2022-09-12 MED ORDER — SEMAGLUTIDE (1 MG/DOSE) 4 MG/3ML ~~LOC~~ SOPN: 1.0000 mg | PEN_INJECTOR | SUBCUTANEOUS | 0 refills | Status: DC

## 2022-09-12 MED ORDER — TESTOSTERONE CYPIONATE 200 MG/ML IM SOLN
100.0000 mg | INTRAMUSCULAR | 1 refills | Status: DC
  Filled 2022-09-12: qty 4, 28d supply, fill #0
  Filled 2022-09-17 – 2022-10-08 (×2): qty 4, 28d supply, fill #1
  Filled 2022-11-07: qty 10, 140d supply, fill #1
  Filled 2022-11-12 (×2): qty 4, 28d supply, fill #1
  Filled 2022-12-02: qty 10, 140d supply, fill #2
  Filled 2022-12-11 (×2): qty 4, 28d supply, fill #2
  Filled 2023-01-10 – 2023-01-16 (×2): qty 4, 28d supply, fill #3

## 2022-09-17 ENCOUNTER — Ambulatory Visit (INDEPENDENT_AMBULATORY_CARE_PROVIDER_SITE_OTHER): Payer: Managed Care, Other (non HMO) | Admitting: Family Medicine

## 2022-09-17 ENCOUNTER — Encounter (INDEPENDENT_AMBULATORY_CARE_PROVIDER_SITE_OTHER): Payer: Self-pay | Admitting: Family Medicine

## 2022-09-17 ENCOUNTER — Other Ambulatory Visit (HOSPITAL_BASED_OUTPATIENT_CLINIC_OR_DEPARTMENT_OTHER): Payer: Self-pay

## 2022-09-17 ENCOUNTER — Encounter (INDEPENDENT_AMBULATORY_CARE_PROVIDER_SITE_OTHER): Payer: Self-pay

## 2022-10-02 ENCOUNTER — Other Ambulatory Visit (HOSPITAL_BASED_OUTPATIENT_CLINIC_OR_DEPARTMENT_OTHER): Payer: Self-pay

## 2022-10-06 ENCOUNTER — Encounter (HOSPITAL_BASED_OUTPATIENT_CLINIC_OR_DEPARTMENT_OTHER): Payer: Self-pay | Admitting: Pharmacist

## 2022-10-06 ENCOUNTER — Other Ambulatory Visit (HOSPITAL_BASED_OUTPATIENT_CLINIC_OR_DEPARTMENT_OTHER): Payer: Self-pay

## 2022-10-13 ENCOUNTER — Other Ambulatory Visit (HOSPITAL_BASED_OUTPATIENT_CLINIC_OR_DEPARTMENT_OTHER): Payer: Self-pay

## 2022-10-15 ENCOUNTER — Other Ambulatory Visit (HOSPITAL_BASED_OUTPATIENT_CLINIC_OR_DEPARTMENT_OTHER): Payer: Self-pay

## 2022-11-01 ENCOUNTER — Other Ambulatory Visit (HOSPITAL_BASED_OUTPATIENT_CLINIC_OR_DEPARTMENT_OTHER): Payer: Self-pay

## 2022-11-01 ENCOUNTER — Encounter (INDEPENDENT_AMBULATORY_CARE_PROVIDER_SITE_OTHER): Payer: Self-pay | Admitting: Family Medicine

## 2022-11-01 ENCOUNTER — Encounter (HOSPITAL_BASED_OUTPATIENT_CLINIC_OR_DEPARTMENT_OTHER): Payer: Self-pay | Admitting: Pharmacist

## 2022-11-01 ENCOUNTER — Ambulatory Visit (INDEPENDENT_AMBULATORY_CARE_PROVIDER_SITE_OTHER): Payer: Managed Care, Other (non HMO) | Admitting: Family Medicine

## 2022-11-01 VITALS — BP 111/69 | HR 79 | Temp 98.2°F | Ht 74.0 in | Wt 312.0 lb

## 2022-11-01 DIAGNOSIS — Z6841 Body Mass Index (BMI) 40.0 and over, adult: Secondary | ICD-10-CM | POA: Diagnosis not present

## 2022-11-01 DIAGNOSIS — Z7985 Long-term (current) use of injectable non-insulin antidiabetic drugs: Secondary | ICD-10-CM

## 2022-11-01 DIAGNOSIS — E1169 Type 2 diabetes mellitus with other specified complication: Secondary | ICD-10-CM | POA: Diagnosis not present

## 2022-11-01 DIAGNOSIS — F3289 Other specified depressive episodes: Secondary | ICD-10-CM

## 2022-11-01 DIAGNOSIS — E669 Obesity, unspecified: Secondary | ICD-10-CM

## 2022-11-01 MED ORDER — SEMAGLUTIDE (1 MG/DOSE) 4 MG/3ML ~~LOC~~ SOPN
1.0000 mg | PEN_INJECTOR | SUBCUTANEOUS | 0 refills | Status: AC
  Filled 2022-11-01 – 2022-11-09 (×4): qty 3, 28d supply, fill #0
  Filled 2022-12-02: qty 3, 28d supply, fill #1
  Filled 2023-01-10 – 2023-01-28 (×2): qty 3, 28d supply, fill #2

## 2022-11-01 MED ORDER — BUPROPION HCL ER (SR) 150 MG PO TB12
450.0000 mg | ORAL_TABLET | Freq: Every day | ORAL | 0 refills | Status: DC
  Filled 2022-11-01 – 2022-11-07 (×2): qty 90, 30d supply, fill #0

## 2022-11-03 ENCOUNTER — Other Ambulatory Visit (HOSPITAL_BASED_OUTPATIENT_CLINIC_OR_DEPARTMENT_OTHER): Payer: Self-pay

## 2022-11-07 ENCOUNTER — Other Ambulatory Visit (HOSPITAL_BASED_OUTPATIENT_CLINIC_OR_DEPARTMENT_OTHER): Payer: Self-pay

## 2022-11-08 ENCOUNTER — Other Ambulatory Visit (HOSPITAL_BASED_OUTPATIENT_CLINIC_OR_DEPARTMENT_OTHER): Payer: Self-pay

## 2022-11-08 ENCOUNTER — Encounter (INDEPENDENT_AMBULATORY_CARE_PROVIDER_SITE_OTHER): Payer: Self-pay | Admitting: Family Medicine

## 2022-11-09 ENCOUNTER — Other Ambulatory Visit (INDEPENDENT_AMBULATORY_CARE_PROVIDER_SITE_OTHER): Payer: Self-pay | Admitting: Family Medicine

## 2022-11-09 ENCOUNTER — Other Ambulatory Visit (HOSPITAL_BASED_OUTPATIENT_CLINIC_OR_DEPARTMENT_OTHER): Payer: Self-pay

## 2022-11-12 ENCOUNTER — Other Ambulatory Visit (HOSPITAL_BASED_OUTPATIENT_CLINIC_OR_DEPARTMENT_OTHER): Payer: Self-pay

## 2022-11-12 ENCOUNTER — Other Ambulatory Visit: Payer: Self-pay

## 2022-11-13 ENCOUNTER — Other Ambulatory Visit (HOSPITAL_BASED_OUTPATIENT_CLINIC_OR_DEPARTMENT_OTHER): Payer: Self-pay

## 2022-11-13 NOTE — Progress Notes (Signed)
Chief Complaint:   OBESITY Luis Russell is here to discuss his progress with his obesity treatment plan along with follow-up of his obesity related diagnoses. Luis Russell is on the Category 3 Plan and states he is following his eating plan approximately 50-60% of the time. Luis Russell states he is walking for 10-15 minutes 3-4 times per week.  Today's visit was #: 26 Starting weight: 342 lbs Starting date: 03/24/2020 Today's weight: 312 lbs Today's date: 11/01/2022 Total lbs lost to date: 30 Total lbs lost since last in-office visit: 17  Interim History: Luis Russell has done well with portion control and he has increased activities. He has decreased soda and increased water. His hunger is controlled. He notes feeling bad when he eats junk food now.   Subjective:   1. Type 2 diabetes mellitus with other specified complication, unspecified whether long term insulin use (HCC) Luis Russell is doing well with his diet and weight loss. He did not bring his blood sugar log in. He denies nausea, vomiting, or constipation.   2. Emotional Eating Behavior Luis Russell is doing well with decreasing emotional eating behaviors. He has no elevated blood pressure, and no tremors were noted.   Assessment/Plan:   1. Type 2 diabetes mellitus with other specified complication, unspecified whether long term insulin use (Luis Russell) Luis Russell will continue Ozempic, and we will refill for 90 days.   - Semaglutide, 1 MG/DOSE, 4 MG/3ML SOPN; Inject 1 mg into the skin once a week.  Dispense: 9 mL; Refill: 0  2. Emotional Eating Behavior Luis Russell will continue Wellbutrin SR, and we will refill for 90 days.   - buPROPion (WELLBUTRIN SR) 150 MG 12 hr tablet; Take 3 tablets (450 mg total) by mouth daily.  Dispense: 90 tablet; Refill: 0  3. Obesity, Current BMI 40.1 Luis Russell is currently in the action stage of change. As such, his goal is to continue with weight loss efforts. He has agreed to practicing portion control and making smarter food choices,  such as increasing vegetables and decreasing simple carbohydrates.   Exercise goals: As is.   Behavioral modification strategies: increasing lean protein intake.  Luis Russell has agreed to follow-up with our clinic in 4 weeks. He was informed of the importance of frequent follow-up visits to maximize his success with intensive lifestyle modifications for his multiple health conditions.   Objective:   Blood pressure 111/69, pulse 79, temperature 98.2 F (36.8 C), height 6\' 2"  (1.88 m), weight (!) 312 lb (141.5 kg), SpO2 92 %. Body mass index is 40.06 kg/m.  General: Cooperative, alert, well developed, in no acute distress. HEENT: Conjunctivae and lids unremarkable. Cardiovascular: Regular rhythm.  Lungs: Normal work of breathing. Neurologic: No focal deficits.   Lab Results  Component Value Date   CREATININE 0.97 07/30/2022   BUN 17 07/30/2022   NA 136 07/30/2022   K 4.1 07/30/2022   CL 99 07/30/2022   CO2 21 07/30/2022   Lab Results  Component Value Date   ALT 31 07/30/2022   AST 22 07/30/2022   ALKPHOS 58 07/30/2022   BILITOT 0.8 07/30/2022   Lab Results  Component Value Date   HGBA1C 6.0 (H) 07/30/2022   HGBA1C 6.2 (H) 11/09/2021   HGBA1C 5.6 12/08/2020   HGBA1C 5.7 (H) 07/13/2020   HGBA1C 7.0 (H) 03/24/2020   Lab Results  Component Value Date   INSULIN 37.2 (H) 07/30/2022   INSULIN 23.1 11/09/2021   INSULIN 16.9 12/08/2020   INSULIN 22.1 07/13/2020   INSULIN 33.8 (H) 03/24/2020  Lab Results  Component Value Date   TSH 3.240 03/24/2020   Lab Results  Component Value Date   CHOL 196 07/30/2022   HDL 40 07/30/2022   LDLCALC 136 (H) 07/30/2022   TRIG 108 07/30/2022   CHOLHDL 4.5 12/08/2020   Lab Results  Component Value Date   VD25OH 35.5 07/30/2022   VD25OH 34.9 11/09/2021   VD25OH 28.9 (L) 12/08/2020   Lab Results  Component Value Date   WBC 7.4 03/24/2020   HGB 13.4 03/24/2020   HCT 41.1 03/24/2020   MCV 90 03/24/2020   PLT 210 03/24/2020    No results found for: "IRON", "TIBC", "FERRITIN"  Attestation Statements:   Reviewed by clinician on day of visit: allergies, medications, problem list, medical history, surgical history, family history, social history, and previous encounter notes.   I, Trixie Dredge, am acting as transcriptionist for Dennard Nip, MD.  I have reviewed the above documentation for accuracy and completeness, and I agree with the above. -  Dennard Nip, MD

## 2022-11-22 ENCOUNTER — Encounter (INDEPENDENT_AMBULATORY_CARE_PROVIDER_SITE_OTHER): Payer: Self-pay | Admitting: Family Medicine

## 2022-11-27 ENCOUNTER — Encounter (INDEPENDENT_AMBULATORY_CARE_PROVIDER_SITE_OTHER): Payer: Self-pay | Admitting: Family Medicine

## 2022-11-27 DIAGNOSIS — E1169 Type 2 diabetes mellitus with other specified complication: Secondary | ICD-10-CM

## 2022-11-28 ENCOUNTER — Encounter (INDEPENDENT_AMBULATORY_CARE_PROVIDER_SITE_OTHER): Payer: Self-pay | Admitting: Family Medicine

## 2022-11-28 NOTE — Telephone Encounter (Signed)
yes

## 2022-11-29 MED ORDER — SEMAGLUTIDE (1 MG/DOSE) 4 MG/3ML ~~LOC~~ SOPN: 1.0000 mg | PEN_INJECTOR | SUBCUTANEOUS | 0 refills | Status: DC

## 2022-12-02 ENCOUNTER — Encounter (HOSPITAL_BASED_OUTPATIENT_CLINIC_OR_DEPARTMENT_OTHER): Payer: Self-pay | Admitting: Pharmacist

## 2022-12-02 ENCOUNTER — Other Ambulatory Visit (HOSPITAL_BASED_OUTPATIENT_CLINIC_OR_DEPARTMENT_OTHER): Payer: Self-pay

## 2022-12-02 ENCOUNTER — Other Ambulatory Visit (INDEPENDENT_AMBULATORY_CARE_PROVIDER_SITE_OTHER): Payer: Self-pay | Admitting: Family Medicine

## 2022-12-02 DIAGNOSIS — F3289 Other specified depressive episodes: Secondary | ICD-10-CM

## 2022-12-02 DIAGNOSIS — E1169 Type 2 diabetes mellitus with other specified complication: Secondary | ICD-10-CM

## 2022-12-02 DIAGNOSIS — E559 Vitamin D deficiency, unspecified: Secondary | ICD-10-CM

## 2022-12-03 ENCOUNTER — Encounter (INDEPENDENT_AMBULATORY_CARE_PROVIDER_SITE_OTHER): Payer: Self-pay | Admitting: Family Medicine

## 2022-12-03 ENCOUNTER — Telehealth (INDEPENDENT_AMBULATORY_CARE_PROVIDER_SITE_OTHER): Payer: Self-pay | Admitting: Family Medicine

## 2022-12-03 NOTE — Telephone Encounter (Signed)
PA submitted via CoverMyMeds  Orvan Falconer (Key: [facility])  Your information has been sent to [facility].

## 2022-12-04 ENCOUNTER — Other Ambulatory Visit (HOSPITAL_BASED_OUTPATIENT_CLINIC_OR_DEPARTMENT_OTHER): Payer: Self-pay

## 2022-12-05 ENCOUNTER — Encounter (HOSPITAL_BASED_OUTPATIENT_CLINIC_OR_DEPARTMENT_OTHER): Payer: Self-pay | Admitting: Pharmacist

## 2022-12-05 ENCOUNTER — Other Ambulatory Visit (HOSPITAL_BASED_OUTPATIENT_CLINIC_OR_DEPARTMENT_OTHER): Payer: Self-pay

## 2022-12-06 ENCOUNTER — Ambulatory Visit (INDEPENDENT_AMBULATORY_CARE_PROVIDER_SITE_OTHER): Payer: Managed Care, Other (non HMO) | Admitting: Family Medicine

## 2022-12-06 ENCOUNTER — Encounter (INDEPENDENT_AMBULATORY_CARE_PROVIDER_SITE_OTHER): Payer: Self-pay | Admitting: Family Medicine

## 2022-12-06 VITALS — BP 123/72 | HR 68 | Temp 98.0°F | Ht 74.0 in | Wt 306.0 lb

## 2022-12-06 DIAGNOSIS — F3289 Other specified depressive episodes: Secondary | ICD-10-CM

## 2022-12-06 DIAGNOSIS — E669 Obesity, unspecified: Secondary | ICD-10-CM

## 2022-12-06 DIAGNOSIS — E1169 Type 2 diabetes mellitus with other specified complication: Secondary | ICD-10-CM | POA: Diagnosis not present

## 2022-12-06 DIAGNOSIS — Z7985 Long-term (current) use of injectable non-insulin antidiabetic drugs: Secondary | ICD-10-CM

## 2022-12-06 DIAGNOSIS — Z6839 Body mass index (BMI) 39.0-39.9, adult: Secondary | ICD-10-CM

## 2022-12-06 NOTE — Telephone Encounter (Signed)
SUMMARY OF PRIOR AUTHORIZATION DECISION DECISION: Approved DRUG Ozempic Inj 4mg /2ml NAME: Luis Russell PATIENT Member ID: [id number] NAME: Case number: [id number] NEXT You can now fill your prescription for this medication. STEPS: VALID 12/03/2022 - 12/04/2023

## 2022-12-11 ENCOUNTER — Other Ambulatory Visit: Payer: Self-pay

## 2022-12-11 ENCOUNTER — Other Ambulatory Visit (HOSPITAL_BASED_OUTPATIENT_CLINIC_OR_DEPARTMENT_OTHER): Payer: Self-pay

## 2022-12-19 NOTE — Progress Notes (Unsigned)
Chief Complaint:   OBESITY Luis Russell is here to discuss his progress with his obesity treatment plan along with follow-up of his obesity related diagnoses. Luis Russell is on practicing portion control and making smarter food choices, such as increasing vegetables and decreasing simple carbohydrates and states he is following his eating plan approximately 70% of the time. Luis Russell states he is active while walking the dog.    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 He is trying to decrease greasy foods and carbohydrate rich side dishes. He is trying to decrease sodas.   Subjective:   1. Type 2 diabetes mellitus with other specified complication, unspecified whether long term insulin use (HCC) [name] is on Ozempic and his last A1c was 6.0. He has had a difficult time with getting refills at his pharmacy in a timely fashion. He is working on his diet and exercise.   2. Emotional Eating Behavior Luis Russell is on Wellbutrin and he is doing well. He has at least 2 months worth of medication at home.   Assessment/Plan:   1. Type 2 diabetes mellitus with other specified complication, unspecified whether long term insulin use (HCC) Good blood sugar control is important to decrease the likelihood of diabetic complications such as nephropathy, neuropathy, limb loss, blindness, coronary artery disease, and death. Intensive lifestyle modification including diet, exercise and weight loss are the first line of treatment for diabetes.   2. Emotional Eating Behavior Luis Russell will continue Wellbutrin and will continue to work on decreasing emotional eating behaviors. We will refill Wellbutrin when he is down to 1 month supply.   3. BMI 39.0-39.9,adult  4. Obesity,  Beginning BMI 43.9 Luis Russell is currently in the action stage of change. As such, his goal is to continue with weight loss efforts. He has agreed to practicing portion control and making smarter food choices, such as increasing vegetables and decreasing simple carbohydrates.   Exercise goals: As is.   Behavioral modification strategies: increasing lean protein intake, decreasing simple carbohydrates, and decreasing liquid calories.  Luis Russell has agreed to follow-up with our clinic in 4 weeks. He was informed of the importance of frequent follow-up visits to maximize his success with intensive lifestyle modifications for his multiple health conditions.   Objective:   Blood pressure 123/72, pulse 68, temperature 98 F (36.7 C), height 6' 2"$  (1.88 m), weight (!) 306 lb (138.8 kg), SpO2 94 %. Body mass index is 39.29 kg/m.  General: Cooperative, alert, well developed, in no acute distress. HEENT: Conjunctivae and lids unremarkable. Cardiovascular: Regular rhythm.  Lungs: Normal work of breathing. Neurologic: No focal deficits.   Lab Results  Component Value Date   CREATININE 0.97 07/30/2022   BUN 17 07/30/2022   NA 136 07/30/2022   K 4.1 07/30/2022   CL 99 07/30/2022   CO2 21 07/30/2022   Lab Results  Component Value Date   ALT 31 07/30/2022   AST 22 07/30/2022   ALKPHOS 58 07/30/2022   BILITOT 0.8 07/30/2022   Lab Results  Component Value Date   HGBA1C 6.0 (H) 07/30/2022   HGBA1C 6.2 (H) 11/09/2021   HGBA1C 5.6 12/08/2020   HGBA1C 5.7 (H) 07/13/2020   HGBA1C 7.0 (H) 03/24/2020   Lab Results  Component Value Date   INSULIN 37.2 (  H) 07/30/2022   INSULIN 23.1 11/09/2021   INSULIN 16.9 12/08/2020   INSULIN 22.1 07/13/2020   INSULIN 33.8 (H) 03/24/2020   Lab Results  Component Value Date   TSH 3.240 03/24/2020   Lab Results  Component Value Date   CHOL 196 07/30/2022   HDL 40 07/30/2022   LDLCALC 136 (H) 07/30/2022   TRIG 108 07/30/2022   CHOLHDL 4.5 12/08/2020    Lab Results  Component Value Date   VD25OH 35.5 07/30/2022   VD25OH 34.9 11/09/2021   VD25OH 28.9 (L) 12/08/2020   Lab Results  Component Value Date   WBC 7.4 03/24/2020   HGB 13.4 03/24/2020   HCT 41.1 03/24/2020   MCV 90 03/24/2020   PLT 210 03/24/2020   No results found for: "IRON", "TIBC", "FERRITIN"  Attestation Statements:   Reviewed by clinician on day of visit: allergies, medications, problem list, medical history, surgical history, family history, social history, and previous encounter notes.   I, Trixie Dredge, am acting as transcriptionist for Dennard Nip, MD.  I have reviewed the above documentation for accuracy and completeness, and I agree with the above. -  Dennard Nip, MD

## 2023-01-01 ENCOUNTER — Other Ambulatory Visit (HOSPITAL_COMMUNITY): Payer: Self-pay

## 2023-01-01 ENCOUNTER — Other Ambulatory Visit (INDEPENDENT_AMBULATORY_CARE_PROVIDER_SITE_OTHER): Payer: Self-pay | Admitting: Family Medicine

## 2023-01-01 DIAGNOSIS — F3289 Other specified depressive episodes: Secondary | ICD-10-CM

## 2023-01-01 MED ORDER — VORTIOXETINE HBR 10 MG PO TABS
10.0000 mg | ORAL_TABLET | Freq: Every day | ORAL | 0 refills | Status: DC
  Filled 2023-01-01 – 2023-01-12 (×4): qty 30, 30d supply, fill #0

## 2023-01-03 ENCOUNTER — Other Ambulatory Visit (INDEPENDENT_AMBULATORY_CARE_PROVIDER_SITE_OTHER): Payer: Self-pay | Admitting: Family Medicine

## 2023-01-03 ENCOUNTER — Other Ambulatory Visit (HOSPITAL_BASED_OUTPATIENT_CLINIC_OR_DEPARTMENT_OTHER): Payer: Self-pay

## 2023-01-03 DIAGNOSIS — E559 Vitamin D deficiency, unspecified: Secondary | ICD-10-CM

## 2023-01-03 MED ORDER — AZITHROMYCIN 250 MG PO TABS
ORAL_TABLET | ORAL | 0 refills | Status: DC
  Filled 2023-01-03: qty 6, 5d supply, fill #0

## 2023-01-04 ENCOUNTER — Other Ambulatory Visit (HOSPITAL_BASED_OUTPATIENT_CLINIC_OR_DEPARTMENT_OTHER): Payer: Self-pay

## 2023-01-07 ENCOUNTER — Telehealth (INDEPENDENT_AMBULATORY_CARE_PROVIDER_SITE_OTHER): Payer: Self-pay | Admitting: Family Medicine

## 2023-01-07 NOTE — Telephone Encounter (Signed)
PA submitted via CoverMyMeds  Key: [id number]  Your information has been sent to [facility].

## 2023-01-08 ENCOUNTER — Encounter (INDEPENDENT_AMBULATORY_CARE_PROVIDER_SITE_OTHER): Payer: Self-pay | Admitting: Family Medicine

## 2023-01-08 ENCOUNTER — Ambulatory Visit (INDEPENDENT_AMBULATORY_CARE_PROVIDER_SITE_OTHER): Payer: Managed Care, Other (non HMO) | Admitting: Family Medicine

## 2023-01-08 VITALS — BP 140/80 | HR 83 | Temp 98.4°F | Ht 74.0 in | Wt 296.0 lb

## 2023-01-08 DIAGNOSIS — Z6838 Body mass index (BMI) 38.0-38.9, adult: Secondary | ICD-10-CM

## 2023-01-08 DIAGNOSIS — F3289 Other specified depressive episodes: Secondary | ICD-10-CM | POA: Diagnosis not present

## 2023-01-08 DIAGNOSIS — E559 Vitamin D deficiency, unspecified: Secondary | ICD-10-CM

## 2023-01-08 DIAGNOSIS — Z6837 Body mass index (BMI) 37.0-37.9, adult: Secondary | ICD-10-CM | POA: Insufficient documentation

## 2023-01-08 DIAGNOSIS — E1169 Type 2 diabetes mellitus with other specified complication: Secondary | ICD-10-CM | POA: Diagnosis not present

## 2023-01-08 DIAGNOSIS — E782 Mixed hyperlipidemia: Secondary | ICD-10-CM | POA: Diagnosis not present

## 2023-01-08 DIAGNOSIS — E669 Obesity, unspecified: Secondary | ICD-10-CM

## 2023-01-08 MED ORDER — VITAMIN D (ERGOCALCIFEROL) 1.25 MG (50000 UNIT) PO CAPS: 50000.0000 [IU] | ORAL_CAPSULE | ORAL | 0 refills | Status: DC

## 2023-01-09 LAB — VITAMIN D 25 HYDROXY (VIT D DEFICIENCY, FRACTURES): Vit D, 25-Hydroxy: 44.6 ng/mL (ref 30.0–100.0)

## 2023-01-09 LAB — CMP14+EGFR
ALT: 31 IU/L (ref 0–44)
AST: 27 IU/L (ref 0–40)
Albumin/Globulin Ratio: 1.9 (ref 1.2–2.2)
Albumin: 4.5 g/dL (ref 3.8–4.9)
Alkaline Phosphatase: 63 IU/L (ref 44–121)
BUN/Creatinine Ratio: 17 (ref 9–20)
BUN: 18 mg/dL (ref 6–24)
Bilirubin Total: 1.1 mg/dL (ref 0.0–1.2)
CO2: 24 mmol/L (ref 20–29)
Calcium: 9.6 mg/dL (ref 8.7–10.2)
Chloride: 101 mmol/L (ref 96–106)
Creatinine, Ser: 1.07 mg/dL (ref 0.76–1.27)
Globulin, Total: 2.4 g/dL (ref 1.5–4.5)
Glucose: 96 mg/dL (ref 70–99)
Potassium: 4.6 mmol/L (ref 3.5–5.2)
Sodium: 139 mmol/L (ref 134–144)
Total Protein: 6.9 g/dL (ref 6.0–8.5)
eGFR: 80 mL/min/{1.73_m2} (ref >59)

## 2023-01-09 LAB — MICROALBUMIN / CREATININE URINE RATIO
Creatinine, Urine: 159.2 mg/dL
Microalb/Creat Ratio: 8 mg/g creat (ref 0–29)
Microalbumin, Urine: 13.4 ug/mL

## 2023-01-09 LAB — LIPID PANEL WITH LDL/HDL RATIO
Cholesterol, Total: 198 mg/dL (ref 100–199)
HDL: 60 mg/dL (ref >39)
LDL Chol Calc (NIH): 125 mg/dL — ABNORMAL HIGH (ref 0–99)
LDL/HDL Ratio: 2.1 ratio (ref 0.0–3.6)
Triglycerides: 71 mg/dL (ref 0–149)
VLDL Cholesterol Cal: 13 mg/dL (ref 5–40)

## 2023-01-09 LAB — INSULIN, RANDOM: INSULIN: 20.1 u[IU]/mL (ref 2.6–24.9)

## 2023-01-09 LAB — HEMOGLOBIN A1C
Est. average glucose Bld gHb Est-mCnc: 105 mg/dL
Hgb A1c MFr Bld: 5.3 % (ref 4.8–5.6)

## 2023-01-09 LAB — VITAMIN B12: Vitamin B-12: 432 pg/mL (ref 232–1245)

## 2023-01-10 ENCOUNTER — Other Ambulatory Visit (HOSPITAL_BASED_OUTPATIENT_CLINIC_OR_DEPARTMENT_OTHER): Payer: Self-pay

## 2023-01-10 ENCOUNTER — Other Ambulatory Visit (INDEPENDENT_AMBULATORY_CARE_PROVIDER_SITE_OTHER): Payer: Self-pay | Admitting: Family Medicine

## 2023-01-10 DIAGNOSIS — E559 Vitamin D deficiency, unspecified: Secondary | ICD-10-CM

## 2023-01-10 DIAGNOSIS — G4709 Other insomnia: Secondary | ICD-10-CM

## 2023-01-10 MED ORDER — VITAMIN D (ERGOCALCIFEROL) 1.25 MG (50000 UNIT) PO CAPS
50000.0000 [IU] | ORAL_CAPSULE | ORAL | 0 refills | Status: DC
  Filled 2023-01-10: qty 4, 28d supply, fill #0
  Filled 2023-03-22: qty 4, 28d supply, fill #1

## 2023-01-10 MED ORDER — TRAZODONE HCL 100 MG PO TABS
ORAL_TABLET | ORAL | 0 refills | Status: DC
  Filled 2023-01-10: qty 30, 30d supply, fill #0

## 2023-01-11 ENCOUNTER — Other Ambulatory Visit (HOSPITAL_BASED_OUTPATIENT_CLINIC_OR_DEPARTMENT_OTHER): Payer: Self-pay

## 2023-01-12 ENCOUNTER — Other Ambulatory Visit (HOSPITAL_BASED_OUTPATIENT_CLINIC_OR_DEPARTMENT_OTHER): Payer: Self-pay

## 2023-01-16 ENCOUNTER — Other Ambulatory Visit (HOSPITAL_BASED_OUTPATIENT_CLINIC_OR_DEPARTMENT_OTHER): Payer: Self-pay

## 2023-01-16 NOTE — Progress Notes (Signed)
Chief Complaint:   OBESITY Luis Russell is here to discuss his progress with his obesity treatment plan along with follow-up of his obesity related diagnoses. Luis Russell is on practicing portion control and making smarter food choices, such as increasing vegetables and decreasing simple carbohydrates and states he is following his eating plan approximately 50% of the time. Luis Russell states he is walking 3-4 times per week.    Today's visit was #: 31 Starting weight: 342 lbs Starting date: 03/24/2020 Today's weight: 296 lbs Today's date: 01/08/2023 Total lbs lost to date: 46 Total lbs lost since last in-office visit: 10  Interim History: Luis Russell has done very well with his weight loss. He is doing better with portion control although his protein may be decreased, and his simple carbohydrates have crept-up.   Subjective:   1. Mixed hyperlipidemia Luis Russell is not on statin, and he is working on his diet and weight loss to improve his cholesterol. He denies chest pain.   2. Type 2 diabetes mellitus with other specified complication, without long-term current use of insulin (HCC) Luis Russell is working on his diet as well as mediations to improve his glucose. He notes decreased polyphagia and this has helped him control his portions.   3. Vitamin D deficiency Luis Russell is on Vitamin D, and he is due for labs. His last level was not yet at goal.   4. Emotional Eating Behavior Luis Russell is on Trintellix and Wellbutrin, and he is doing well with decreasing emotional eating behaviors. No side effects were noted.   Assessment/Plan:   1. Mixed hyperlipidemia We will check labs today, and we will follow-up at Luis Russell's next visit.   - Lipid Panel With LDL/HDL Ratio  2. Type 2 diabetes mellitus with other specified complication, without long-term current use of insulin (HCC) We will check labs today, and we will follow-up at Luis Russell's next visit.   - Microalbumin / creatinine urine ratio - Insulin, random -  Hemoglobin A1c - CMP14+EGFR - Vitamin B12  3. Vitamin D deficiency We will check labs today. We will refill prescription Vitamin D 50,000 IU once weekly #12 for 90 days.   - VITAMIN D 25 Hydroxy (Vit-D Deficiency, Fractures)  4. Emotional Eating Behavior We will continue to manage his medications and follow closely.   5. BMI 38.0-38.9,adult  6. Obesity, Beginning BMI 43.9 Luis Russell is currently in the action stage of change. As such, his goal is to continue with weight loss efforts. He has agreed to the Category 4 Plan.   Exercise goals: As is.   Behavioral modification strategies: increasing lean protein intake and decreasing simple carbohydrates.  Luis Russell has agreed to follow-up with our clinic in 4 weeks. He was informed of the importance of frequent follow-up visits to maximize his success with intensive lifestyle modifications for his multiple health conditions.   Luis Russell was informed we would discuss his lab results at his next visit unless there is a critical issue that needs to be addressed sooner. Luis Russell agreed to keep his next visit at the agreed upon time to discuss these results.  Objective:   Blood pressure (!) 140/80, pulse 83, temperature 98.4 F (36.9 C), height 6\' 2"  (1.88 m), weight 296 lb (134.3 kg), SpO2 95 %. Body mass index is 38 kg/m.  Lab Results  Component Value Date   CREATININE 1.07 01/08/2023   BUN 18 01/08/2023   NA 139 01/08/2023   K 4.6 01/08/2023   CL 101 01/08/2023   CO2 24 01/08/2023  Lab Results  Component Value Date   ALT 31 01/08/2023   AST 27 01/08/2023   ALKPHOS 63 01/08/2023   BILITOT 1.1 01/08/2023   Lab Results  Component Value Date   HGBA1C 5.3 01/08/2023   HGBA1C 6.0 (H) 07/30/2022   HGBA1C 6.2 (H) 11/09/2021   HGBA1C 5.6 12/08/2020   HGBA1C 5.7 (H) 07/13/2020   Lab Results  Component Value Date   INSULIN 20.1 01/08/2023   INSULIN 37.2 (H) 07/30/2022   INSULIN 23.1 11/09/2021   INSULIN 16.9 12/08/2020   INSULIN 22.1  07/13/2020   Lab Results  Component Value Date   TSH 3.240 03/24/2020   Lab Results  Component Value Date   CHOL 198 01/08/2023   HDL 60 01/08/2023   LDLCALC 125 (H) 01/08/2023   TRIG 71 01/08/2023   CHOLHDL 4.5 12/08/2020   Lab Results  Component Value Date   VD25OH 44.6 01/08/2023   VD25OH 35.5 07/30/2022   VD25OH 34.9 11/09/2021   Lab Results  Component Value Date   WBC 7.4 03/24/2020   HGB 13.4 03/24/2020   HCT 41.1 03/24/2020   MCV 90 03/24/2020   PLT 210 03/24/2020   No results found for: "IRON", "TIBC", "FERRITIN"  Attestation Statements:   Reviewed by clinician on day of visit: allergies, medications, problem list, medical history, surgical history, family history, social history, and previous encounter notes.   I, Burt Knack, am acting as transcriptionist for [name], MD.  I have reviewed the above documentation for accuracy and completeness, and I agree with the above. -  [name], MD

## 2023-01-18 ENCOUNTER — Other Ambulatory Visit: Payer: Self-pay

## 2023-01-21 ENCOUNTER — Other Ambulatory Visit: Payer: Self-pay | Admitting: Family Medicine

## 2023-01-21 ENCOUNTER — Ambulatory Visit
Admission: RE | Admit: 2023-01-21 | Discharge: 2023-01-21 | Disposition: A | Discharge status: Home / Self Care | Payer: Managed Care, Other (non HMO) | Source: Ambulatory Visit | Attending: Family Medicine | Admitting: Family Medicine

## 2023-01-21 DIAGNOSIS — R051 Acute cough: Secondary | ICD-10-CM

## 2023-01-24 ENCOUNTER — Encounter (INDEPENDENT_AMBULATORY_CARE_PROVIDER_SITE_OTHER): Payer: Self-pay | Admitting: Family Medicine

## 2023-01-24 ENCOUNTER — Other Ambulatory Visit (HOSPITAL_BASED_OUTPATIENT_CLINIC_OR_DEPARTMENT_OTHER): Payer: Self-pay

## 2023-01-24 MED ORDER — TRINTELLIX 20 MG PO TABS
20.0000 mg | ORAL_TABLET | Freq: Every day | ORAL | 2 refills | Status: DC
  Filled 2023-01-24: qty 30, 30d supply, fill #0
  Filled 2023-02-18: qty 30, 30d supply, fill #1
  Filled 2023-03-22 – 2023-06-12 (×4): qty 30, 30d supply, fill #2

## 2023-01-25 ENCOUNTER — Other Ambulatory Visit (HOSPITAL_BASED_OUTPATIENT_CLINIC_OR_DEPARTMENT_OTHER): Payer: Self-pay

## 2023-01-28 ENCOUNTER — Other Ambulatory Visit (HOSPITAL_BASED_OUTPATIENT_CLINIC_OR_DEPARTMENT_OTHER): Payer: Self-pay

## 2023-01-30 ENCOUNTER — Other Ambulatory Visit (HOSPITAL_BASED_OUTPATIENT_CLINIC_OR_DEPARTMENT_OTHER): Payer: Self-pay

## 2023-02-05 ENCOUNTER — Ambulatory Visit (INDEPENDENT_AMBULATORY_CARE_PROVIDER_SITE_OTHER): Payer: Managed Care, Other (non HMO) | Admitting: Family Medicine

## 2023-02-05 ENCOUNTER — Encounter (INDEPENDENT_AMBULATORY_CARE_PROVIDER_SITE_OTHER): Payer: Self-pay | Admitting: Family Medicine

## 2023-02-05 ENCOUNTER — Other Ambulatory Visit (HOSPITAL_BASED_OUTPATIENT_CLINIC_OR_DEPARTMENT_OTHER): Payer: Self-pay

## 2023-02-05 VITALS — BP 116/64 | HR 86 | Temp 98.4°F | Ht 74.0 in | Wt 293.0 lb

## 2023-02-05 DIAGNOSIS — Z7985 Long-term (current) use of injectable non-insulin antidiabetic drugs: Secondary | ICD-10-CM

## 2023-02-05 DIAGNOSIS — E1169 Type 2 diabetes mellitus with other specified complication: Secondary | ICD-10-CM | POA: Diagnosis not present

## 2023-02-05 DIAGNOSIS — F3289 Other specified depressive episodes: Secondary | ICD-10-CM

## 2023-02-05 DIAGNOSIS — G4709 Other insomnia: Secondary | ICD-10-CM | POA: Diagnosis not present

## 2023-02-05 DIAGNOSIS — Z6837 Body mass index (BMI) 37.0-37.9, adult: Secondary | ICD-10-CM

## 2023-02-05 DIAGNOSIS — E669 Obesity, unspecified: Secondary | ICD-10-CM

## 2023-02-05 MED ORDER — SEMAGLUTIDE (1 MG/DOSE) 4 MG/3ML ~~LOC~~ SOPN
1.0000 mg | PEN_INJECTOR | SUBCUTANEOUS | 0 refills | Status: DC
  Filled 2023-02-05: qty 9, 84d supply, fill #0
  Filled 2023-03-13: qty 3, 28d supply, fill #0

## 2023-02-05 MED ORDER — BUPROPION HCL ER (SR) 150 MG PO TB12
450.0000 mg | ORAL_TABLET | Freq: Every day | ORAL | 0 refills | Status: DC
  Filled 2023-02-05 – 2023-03-22 (×2): qty 90, 30d supply, fill #0

## 2023-02-05 MED ORDER — TRAZODONE HCL 100 MG PO TABS
100.0000 mg | ORAL_TABLET | Freq: Every day | ORAL | 0 refills | Status: DC
  Filled 2023-02-05 – 2023-03-22 (×2): qty 30, 30d supply, fill #0

## 2023-02-06 NOTE — Progress Notes (Signed)
Chief Complaint:   OBESITY Luis Russell is here to discuss his progress with his obesity treatment plan along with follow-up of his obesity related diagnoses. Luis Russell is on the Category 4 Plan and states he is following his eating plan approximately 70% of the time. Luis Russell states he is active while doing yard work, and he is walking for 20-30 minutes 4 times per week.  Today's visit was #: 32 Starting weight: 342 lbs Starting date: 03/24/2020 Today's weight: 293 lbs Today's date: 02/05/2023 Total lbs lost to date: 49 Total lbs lost since last in-office visit: 3  Interim History: Luis Russell continues to do well with his weight loss.  He is staying active doing yard work.  He feels well overall and his hunger is mostly controlled.  He is doing well with increasing lean protein.  Subjective:   1. Type 2 diabetes mellitus with other specified complication, unspecified whether long term insulin use Luis Russell is doing well with his weight loss.  He is trying to decrease simple carbohydrates and he notes some diarrhea when he eats poorly.  2. Other insomnia Luis Russell is sleeping well overall.  He notes some night sweats recently.  No cough.  He thinks it may be related to his testosterone.  3. Emotional Eating Behavior Luis Russell continues to do well with decreasing emotional eating behavior.  He is stable on Wellbutrin, and his blood pressure is not elevated.  Assessment/Plan:   1. Type 2 diabetes mellitus with other specified complication, unspecified whether long term insulin use Luis Russell will continue his medications, and we will continue to work on decreasing simple carbohydrates.  We will refill Ozempic for 90 days.  - Semaglutide, 1 MG/DOSE, 4 MG/3ML SOPN; Inject 1 mg as directed once a week.  Dispense: 9 mL; Refill: 0  2. Other insomnia We will refill trazodone for 1 month.  Luis Russell is to discuss testosterone with his urologist at his appointment next week.  - traZODone (DESYREL) 100 MG tablet;  TAKE 1 TABLET BY MOUTH EVERY DAY AT BEDTIME FOR SLEEP  Dispense: 30 tablet; Refill: 0  3. Emotional Eating Behavior Luis Russell will continue Wellbutrin SR, and we will refill for 1 month.  - buPROPion (WELLBUTRIN SR) 150 MG 12 hr tablet; Take 3 tablets (450 mg total) by mouth daily.  Dispense: 90 tablet; Refill: 0  4. BMI 37.0-37.9, adult  5. Obesity, Beginning BMI 43.9 Luis Russell is currently in the action stage of change. As such, his goal is to continue with weight loss efforts. He has agreed to the Category 3 Plan.   Exercise goals: As is.   Behavioral modification strategies: increasing lean protein intake.  Luis Russell has agreed to follow-up with our clinic in 4 weeks. He was informed of the importance of frequent follow-up visits to maximize his success with intensive lifestyle modifications for his multiple health conditions.   Objective:   Blood pressure 116/64, pulse 86, temperature 98.4 F (36.9 C), height 6\' 2"  (1.88 m), weight 293 lb (132.9 kg), SpO2 93 %. Body mass index is 37.62 kg/m.  Lab Results  Component Value Date   CREATININE 1.07 01/08/2023   BUN 18 01/08/2023   NA 139 01/08/2023   K 4.6 01/08/2023   CL 101 01/08/2023   CO2 24 01/08/2023   Lab Results  Component Value Date   ALT 31 01/08/2023   AST 27 01/08/2023   ALKPHOS 63 01/08/2023   BILITOT 1.1 01/08/2023   Lab Results  Component Value Date   HGBA1C 5.3 01/08/2023  HGBA1C 6.0 (H) 07/30/2022   HGBA1C 6.2 (H) 11/09/2021   HGBA1C 5.6 12/08/2020   HGBA1C 5.7 (H) 07/13/2020   Lab Results  Component Value Date   INSULIN 20.1 01/08/2023   INSULIN 37.2 (H) 07/30/2022   INSULIN 23.1 11/09/2021   INSULIN 16.9 12/08/2020   INSULIN 22.1 07/13/2020   Lab Results  Component Value Date   TSH 3.240 03/24/2020   Lab Results  Component Value Date   CHOL 198 01/08/2023   HDL 60 01/08/2023   LDLCALC 125 (H) 01/08/2023   TRIG 71 01/08/2023   CHOLHDL 4.5 12/08/2020   Lab Results  Component Value Date    VD25OH 44.6 01/08/2023   VD25OH 35.5 07/30/2022   VD25OH 34.9 11/09/2021   Lab Results  Component Value Date   WBC 7.4 03/24/2020   HGB 13.4 03/24/2020   HCT 41.1 03/24/2020   MCV 90 03/24/2020   PLT 210 03/24/2020   No results found for: "IRON", "TIBC", "FERRITIN"  Attestation Statements:   Reviewed by clinician on day of visit: allergies, medications, problem list, medical history, surgical history, family history, social history, and previous encounter notes.   I, Burt Knack, am acting as transcriptionist for [name], MD.  I have reviewed the above documentation for accuracy and completeness, and I agree with the above. -  [name], MD

## 2023-02-11 ENCOUNTER — Ambulatory Visit (INDEPENDENT_AMBULATORY_CARE_PROVIDER_SITE_OTHER): Payer: Managed Care, Other (non HMO) | Admitting: Family Medicine

## 2023-02-14 ENCOUNTER — Other Ambulatory Visit (HOSPITAL_BASED_OUTPATIENT_CLINIC_OR_DEPARTMENT_OTHER): Payer: Self-pay

## 2023-02-22 ENCOUNTER — Other Ambulatory Visit (HOSPITAL_BASED_OUTPATIENT_CLINIC_OR_DEPARTMENT_OTHER): Payer: Self-pay

## 2023-02-23 ENCOUNTER — Other Ambulatory Visit (HOSPITAL_BASED_OUTPATIENT_CLINIC_OR_DEPARTMENT_OTHER): Payer: Self-pay

## 2023-02-24 ENCOUNTER — Other Ambulatory Visit (HOSPITAL_BASED_OUTPATIENT_CLINIC_OR_DEPARTMENT_OTHER): Payer: Self-pay

## 2023-02-25 ENCOUNTER — Other Ambulatory Visit (HOSPITAL_BASED_OUTPATIENT_CLINIC_OR_DEPARTMENT_OTHER): Payer: Self-pay

## 2023-02-26 ENCOUNTER — Other Ambulatory Visit (HOSPITAL_BASED_OUTPATIENT_CLINIC_OR_DEPARTMENT_OTHER): Payer: Self-pay

## 2023-02-26 MED ORDER — TESTOSTERONE CYPIONATE 200 MG/ML IM SOLN
100.0000 mg | INTRAMUSCULAR | 1 refills | Status: DC
  Filled 2023-02-26 – 2023-03-13 (×2): qty 4, 28d supply, fill #0
  Filled 2023-04-18: qty 4, 28d supply, fill #1

## 2023-03-13 ENCOUNTER — Other Ambulatory Visit (HOSPITAL_BASED_OUTPATIENT_CLINIC_OR_DEPARTMENT_OTHER): Payer: Self-pay

## 2023-03-13 ENCOUNTER — Ambulatory Visit (INDEPENDENT_AMBULATORY_CARE_PROVIDER_SITE_OTHER): Payer: Managed Care, Other (non HMO) | Admitting: Family Medicine

## 2023-03-13 ENCOUNTER — Encounter (INDEPENDENT_AMBULATORY_CARE_PROVIDER_SITE_OTHER): Payer: Self-pay | Admitting: Family Medicine

## 2023-03-13 VITALS — BP 107/66 | HR 76 | Temp 98.5°F | Ht 74.0 in | Wt 295.0 lb

## 2023-03-13 DIAGNOSIS — Z7985 Long-term (current) use of injectable non-insulin antidiabetic drugs: Secondary | ICD-10-CM

## 2023-03-13 DIAGNOSIS — E1169 Type 2 diabetes mellitus with other specified complication: Secondary | ICD-10-CM | POA: Diagnosis not present

## 2023-03-13 DIAGNOSIS — E669 Obesity, unspecified: Secondary | ICD-10-CM | POA: Diagnosis not present

## 2023-03-13 DIAGNOSIS — Z6837 Body mass index (BMI) 37.0-37.9, adult: Secondary | ICD-10-CM

## 2023-03-18 NOTE — Progress Notes (Unsigned)
Chief Complaint:   OBESITY Luis Russell is here to discuss his progress with his obesity treatment plan along with follow-up of his obesity related diagnoses. Luis Russell is on the Category 3 Plan and states he is following his eating plan approximately 50% of the time. Luis Russell states he is active while doing yard work.    Today's visit was #: 33 Starting weight: 342 lbs Starting date: 03/24/2020 Today's weight: 295 lbs Today's date: 03/13/2023 Total lbs lost to date: 47 Total lbs lost since last in-office visit: 0  Interim History: Luis Russell has been doing a lot of physical labor and extensive yard work.  He continues to work on his diet as well.  His weight is up, but some of this is muscle.  Subjective:   1. Type 2 diabetes mellitus with other specified complication, unspecified whether long term insulin use (HCC) Luis Russell continues to improve his glucose levels.  He notes increased hunger recently with increased exercise, but he has increased simple carbohydrates as well.  Assessment/Plan:   1. Type 2 diabetes mellitus with other specified complication, unspecified whether long term insulin use (HCC) Luis Russell agreed to increase Ozempic to 2 mg (patient to increase to 1.5 mg for now).  We will continue to monitor, and he will continue to work on decreasing simple carbohydrates.  2. Obesity, Beginning BMI 43.9  3. BMI 37.0-37.9, adult Luis Russell is currently in the action stage of change. As such, his goal is to continue with weight loss efforts. He has agreed to the Category 4 Plan.   Exercise goals: As is.   Behavioral modification strategies: increasing water intake, decreasing liquid calories, and no skipping meals.  Luis Russell has agreed to follow-up with our clinic in 3 to 4 weeks. He was informed of the importance of frequent follow-up visits to maximize his success with intensive lifestyle modifications for his multiple health conditions.   Objective:   Blood pressure 107/66, pulse 76,  temperature 98.5 F (36.9 C), height 6\' 2"  (1.88 m), weight 295 lb (133.8 kg), SpO2 94 %. Body mass index is 37.88 kg/m.  Lab Results  Component Value Date   CREATININE 1.07 01/08/2023   BUN 18 01/08/2023   NA 139 01/08/2023   K 4.6 01/08/2023   CL 101 01/08/2023   CO2 24 01/08/2023   Lab Results  Component Value Date   ALT 31 01/08/2023   AST 27 01/08/2023   ALKPHOS 63 01/08/2023   BILITOT 1.1 01/08/2023   Lab Results  Component Value Date   HGBA1C 5.3 01/08/2023   HGBA1C 6.0 (H) 07/30/2022   HGBA1C 6.2 (H) 11/09/2021   HGBA1C 5.6 12/08/2020   HGBA1C 5.7 (H) 07/13/2020   Lab Results  Component Value Date   INSULIN 20.1 01/08/2023   INSULIN 37.2 (H) 07/30/2022   INSULIN 23.1 11/09/2021   INSULIN 16.9 12/08/2020   INSULIN 22.1 07/13/2020   Lab Results  Component Value Date   TSH 3.240 03/24/2020   Lab Results  Component Value Date   CHOL 198 01/08/2023   HDL 60 01/08/2023   LDLCALC 125 (H) 01/08/2023   TRIG 71 01/08/2023   CHOLHDL 4.5 12/08/2020   Lab Results  Component Value Date   VD25OH 44.6 01/08/2023   VD25OH 35.5 07/30/2022   VD25OH 34.9 11/09/2021   Lab Results  Component Value Date   WBC 7.4 03/24/2020   HGB 13.4 03/24/2020   HCT 41.1 03/24/2020   MCV 90 03/24/2020   PLT 210 03/24/2020   No results  found for: "IRON", "TIBC", "FERRITIN"  Attestation Statements:   Reviewed by clinician on day of visit: allergies, medications, problem list, medical history, surgical history, family history, social history, and previous encounter notes.  Time spent on visit including pre-visit chart review and post-visit care and charting was 30 minutes.   I, Burt Knack, am acting as transcriptionist for [name], MD.  I have reviewed the above documentation for accuracy and completeness, and I agree with the above. -  [name], MD

## 2023-03-22 ENCOUNTER — Other Ambulatory Visit: Payer: Self-pay

## 2023-03-22 ENCOUNTER — Other Ambulatory Visit (HOSPITAL_BASED_OUTPATIENT_CLINIC_OR_DEPARTMENT_OTHER): Payer: Self-pay

## 2023-04-02 ENCOUNTER — Other Ambulatory Visit (INDEPENDENT_AMBULATORY_CARE_PROVIDER_SITE_OTHER): Payer: Self-pay | Admitting: Family Medicine

## 2023-04-02 DIAGNOSIS — E559 Vitamin D deficiency, unspecified: Secondary | ICD-10-CM

## 2023-04-06 ENCOUNTER — Encounter (INDEPENDENT_AMBULATORY_CARE_PROVIDER_SITE_OTHER): Payer: Self-pay | Admitting: Family Medicine

## 2023-04-06 DIAGNOSIS — E1169 Type 2 diabetes mellitus with other specified complication: Secondary | ICD-10-CM

## 2023-04-08 ENCOUNTER — Other Ambulatory Visit (HOSPITAL_BASED_OUTPATIENT_CLINIC_OR_DEPARTMENT_OTHER): Payer: Self-pay

## 2023-04-08 MED ORDER — SEMAGLUTIDE(0.25 OR 0.5MG/DOS) 2 MG/3ML ~~LOC~~ SOPN
2.0000 mg | PEN_INJECTOR | SUBCUTANEOUS | 0 refills | Status: DC
  Filled 2023-04-08: qty 3, 28d supply, fill #0

## 2023-04-08 NOTE — Telephone Encounter (Signed)
Luis Russell, the wrong dose was sent in, he needs to 2mg  pen.

## 2023-04-16 ENCOUNTER — Other Ambulatory Visit (HOSPITAL_BASED_OUTPATIENT_CLINIC_OR_DEPARTMENT_OTHER): Payer: Self-pay

## 2023-04-16 ENCOUNTER — Ambulatory Visit: Payer: 59 | Admitting: Behavioral Health

## 2023-04-16 ENCOUNTER — Other Ambulatory Visit (INDEPENDENT_AMBULATORY_CARE_PROVIDER_SITE_OTHER): Payer: Self-pay | Admitting: Family Medicine

## 2023-04-16 ENCOUNTER — Encounter (INDEPENDENT_AMBULATORY_CARE_PROVIDER_SITE_OTHER): Payer: Self-pay | Admitting: Family Medicine

## 2023-04-16 DIAGNOSIS — Z0389 Encounter for observation for other suspected diseases and conditions ruled out: Secondary | ICD-10-CM

## 2023-04-16 DIAGNOSIS — E1169 Type 2 diabetes mellitus with other specified complication: Secondary | ICD-10-CM

## 2023-04-16 NOTE — Progress Notes (Signed)
Patient did not feel like he needed medication and preferred psychotherapy at this time. He would would like to schedule with therapist. I will not be taking him as new patient at this time.  No charges to be assessed today.

## 2023-04-17 ENCOUNTER — Encounter (INDEPENDENT_AMBULATORY_CARE_PROVIDER_SITE_OTHER): Payer: Self-pay | Admitting: Family Medicine

## 2023-04-17 ENCOUNTER — Other Ambulatory Visit (HOSPITAL_BASED_OUTPATIENT_CLINIC_OR_DEPARTMENT_OTHER): Payer: Self-pay

## 2023-04-17 DIAGNOSIS — E1169 Type 2 diabetes mellitus with other specified complication: Secondary | ICD-10-CM

## 2023-04-18 ENCOUNTER — Other Ambulatory Visit (HOSPITAL_BASED_OUTPATIENT_CLINIC_OR_DEPARTMENT_OTHER): Payer: Self-pay

## 2023-04-18 ENCOUNTER — Other Ambulatory Visit (INDEPENDENT_AMBULATORY_CARE_PROVIDER_SITE_OTHER): Payer: Self-pay | Admitting: Family Medicine

## 2023-04-18 DIAGNOSIS — E1169 Type 2 diabetes mellitus with other specified complication: Secondary | ICD-10-CM

## 2023-04-18 MED ORDER — SEMAGLUTIDE (2 MG/DOSE) 8 MG/3ML ~~LOC~~ SOPN
2.0000 mg | PEN_INJECTOR | SUBCUTANEOUS | 0 refills | Status: DC
Start: 2023-04-18 — End: 2023-04-30
  Filled 2023-04-18: qty 3, 28d supply, fill #0

## 2023-04-19 ENCOUNTER — Other Ambulatory Visit (HOSPITAL_BASED_OUTPATIENT_CLINIC_OR_DEPARTMENT_OTHER): Payer: Self-pay

## 2023-04-22 ENCOUNTER — Other Ambulatory Visit (HOSPITAL_BASED_OUTPATIENT_CLINIC_OR_DEPARTMENT_OTHER): Payer: Self-pay

## 2023-04-30 ENCOUNTER — Other Ambulatory Visit (HOSPITAL_BASED_OUTPATIENT_CLINIC_OR_DEPARTMENT_OTHER): Payer: Self-pay

## 2023-04-30 ENCOUNTER — Encounter (INDEPENDENT_AMBULATORY_CARE_PROVIDER_SITE_OTHER): Payer: Self-pay | Admitting: Family Medicine

## 2023-04-30 ENCOUNTER — Ambulatory Visit (INDEPENDENT_AMBULATORY_CARE_PROVIDER_SITE_OTHER): Payer: Managed Care, Other (non HMO) | Admitting: Family Medicine

## 2023-04-30 VITALS — BP 111/67 | HR 71 | Temp 98.1°F | Ht 74.0 in | Wt 301.0 lb

## 2023-04-30 DIAGNOSIS — E1169 Type 2 diabetes mellitus with other specified complication: Secondary | ICD-10-CM | POA: Diagnosis not present

## 2023-04-30 DIAGNOSIS — F3289 Other specified depressive episodes: Secondary | ICD-10-CM

## 2023-04-30 DIAGNOSIS — E559 Vitamin D deficiency, unspecified: Secondary | ICD-10-CM | POA: Diagnosis not present

## 2023-04-30 DIAGNOSIS — Z6838 Body mass index (BMI) 38.0-38.9, adult: Secondary | ICD-10-CM

## 2023-04-30 DIAGNOSIS — E782 Mixed hyperlipidemia: Secondary | ICD-10-CM

## 2023-04-30 DIAGNOSIS — E669 Obesity, unspecified: Secondary | ICD-10-CM

## 2023-04-30 MED ORDER — SEMAGLUTIDE (2 MG/DOSE) 8 MG/3ML ~~LOC~~ SOPN
2.0000 mg | PEN_INJECTOR | SUBCUTANEOUS | 0 refills | Status: DC
Start: 2023-04-30 — End: 2023-06-05
  Filled 2023-04-30 – 2023-05-13 (×2): qty 3, 28d supply, fill #0

## 2023-04-30 NOTE — Progress Notes (Signed)
.smr  Office: [phone]  /  Fax: [phone]  WEIGHT SUMMARY AND BIOMETRICS  Anthropometric Measurements Height: 6\' 2"  (1.88 m) Weight: (!) 301 lb (136.5 kg) BMI (Calculated): 38.63 Weight at Last Visit: 295 lb Weight Lost Since Last Visit: 0 Weight Gained Since Last Visit: 6 lb Starting Weight: 342 lb Total Weight Loss (lbs): 41 lb (18.6 kg)   Body Composition  Body Fat %: 37.8 % Fat Mass (lbs): 114 lbs Muscle Mass (lbs): 178.4 lbs Total Body Water (lbs): 129.4 lbs Visceral Fat Rating : 23   Other Clinical Data Fasting: Yes Labs: Yes Today's Visit #: 34    Chief Complaint: OBESITY   Discussed the use of AI scribe software for clinical note transcription with the patient, who gave verbal consent to proceed.  History of Present Illness   The patient is a 57 year old male with a history of obesity, type 2 diabetes, vitamin D deficiency, hyperlipidemia, and depression with emotional eating behaviors. He reports a weight gain of six pounds over the past six weeks and admits to only adhering to his prescribed dietary plan about 50% of the time. The patient attributes this to increased temptation at home, with a particular weakness for sweet foods, specifically gummy lifesavers.  The patient has been engaging in yard work for about 30 minutes, three to four times per week, as a form of physical activity. However, he expresses a need to increase his activity levels, which has been challenging due to recent hot weather. He also mentions experiencing multiple insect stings during his outdoor activities, with one incident causing significant hand swelling that resolved with Benadryl.  The patient also reports a recent issue with his medication, where the pharmacy misread his prescription, leading to a few weeks without his medication. He also mentions a stressful work environment, which has been exacerbated since transitioning to working from home. This stress has been  contributing to his emotional eating behaviors and has occasionally affected his sleep. He has a prescription for Trazodone but prefers to avoid it due to the morning after effects.  The patient is due for a physical examination later this month and a colonoscopy in August. He is also scheduled for blood work, including CMP, A1c, insulin, lipids, and vitamin D levels.          PHYSICAL EXAM:  Blood pressure 111/67, pulse 71, temperature 98.1 F (36.7 C), height 6\' 2"  (1.88 m), weight (!) 301 lb (136.5 kg), SpO2 94 %. Body mass index is 38.65 kg/m.  DIAGNOSTIC DATA REVIEWED:  BMET    Component Value Date/Time   NA 139 01/08/2023 0737   K 4.6 01/08/2023 0737   CL 101 01/08/2023 0737   CO2 24 01/08/2023 0737   GLUCOSE 96 01/08/2023 0737   GLUCOSE 146 (H) 07/09/2016 1823   BUN 18 01/08/2023 0737   CREATININE 1.07 01/08/2023 0737   CALCIUM 9.6 01/08/2023 0737   GFRNONAA 75 12/08/2020 1402   GFRAA 87 12/08/2020 1402   Lab Results  Component Value Date   HGBA1C 5.3 01/08/2023   HGBA1C 5.8 (H) 06/08/2016   Lab Results  Component Value Date   INSULIN 20.1 01/08/2023   INSULIN 33.8 (H) 03/24/2020   Lab Results  Component Value Date   TSH 3.240 03/24/2020   CBC    Component Value Date/Time   WBC 7.4 03/24/2020 1234   WBC 8.9 07/09/2016 1823   RBC 4.56 03/24/2020 1234   RBC 4.51 07/09/2016 1823   HGB 13.4 03/24/2020 1234  HCT 41.1 03/24/2020 1234   PLT 210 03/24/2020 1234   MCV 90 03/24/2020 1234   MCH 29.4 03/24/2020 1234   MCH 29.3 07/09/2016 1823   MCHC 32.6 03/24/2020 1234   MCHC 32.1 07/09/2016 1823   RDW 13.0 03/24/2020 1234   Iron Studies No results found for: "IRON", "TIBC", "FERRITIN", "IRONPCTSAT" Lipid Panel     Component Value Date/Time   CHOL 198 01/08/2023 0737   TRIG 71 01/08/2023 0737   HDL 60 01/08/2023 0737   CHOLHDL 4.5 12/08/2020 1402   LDLCALC 125 (H) 01/08/2023 0737   Hepatic Function Panel     Component Value Date/Time   PROT 6.9  01/08/2023 0737   ALBUMIN 4.5 01/08/2023 0737   AST 27 01/08/2023 0737   ALT 31 01/08/2023 0737   ALKPHOS 63 01/08/2023 0737   BILITOT 1.1 01/08/2023 0737      Component Value Date/Time   TSH 3.240 03/24/2020 1234   Nutritional Lab Results  Component Value Date   VD25OH 44.6 01/08/2023   VD25OH 35.5 07/30/2022   VD25OH 34.9 11/09/2021     Assessment and Plan    Obesity: Weight gain of 6 pounds over the past 6 weeks. Adherence to category four plan is 50%. Increased physical activity with walking and yard work. Emotional eating behaviors noted, particularly with sweets. -Encourage continued physical activity and adherence to category four plan. -Address emotional eating behaviors and explore strategies to manage temptations at home.  Type 2 Diabetes: Recent medication issue with incorrect dosage dispensed by pharmacy. Patient was without medication for a couple of weeks. -Ensure correct dosage of medication is dispensed (2mg ). But patient to take 1.5 mg or 54 clicks on his pen. -Check blood sugars, A1C, and insulin levels today.  Vitamin D Deficiency: Currently on prescription Vitamin D 50,000 IU once a week. -Check Vitamin D levels today.  Hyperlipidemia: Not currently on any medication for this condition. -Check lipid panel today and continue low cholesterol diet  Depression with Emotional Eating Behaviors: Reports increased stress from work, particularly since working from home. Difficulty sleeping, occasional use of Trazodone. -Consider strategies to manage work-related stress. -Advise taking Trazodone 2-3 hours before bed if needed to minimize morning grogginess.  General Health Maintenance: -Physical exam scheduled later this month. -Colonoscopy scheduled in August.       He was informed of the importance of frequent follow up visits to maximize his success with intensive lifestyle modifications for his multiple health conditions.    [name], MD

## 2023-05-01 LAB — CMP14+EGFR
ALT: 30 IU/L (ref 0–44)
AST: 25 IU/L (ref 0–40)
Albumin: 4.2 g/dL (ref 3.8–4.9)
Alkaline Phosphatase: 62 IU/L (ref 44–121)
BUN/Creatinine Ratio: 17 (ref 9–20)
BUN: 17 mg/dL (ref 6–24)
Bilirubin Total: 0.6 mg/dL (ref 0.0–1.2)
CO2: 25 mmol/L (ref 20–29)
Calcium: 9.1 mg/dL (ref 8.7–10.2)
Chloride: 102 mmol/L (ref 96–106)
Creatinine, Ser: 1.01 mg/dL (ref 0.76–1.27)
Globulin, Total: 2.4 g/dL (ref 1.5–4.5)
Glucose: 111 mg/dL — ABNORMAL HIGH (ref 70–99)
Potassium: 4.8 mmol/L (ref 3.5–5.2)
Sodium: 139 mmol/L (ref 134–144)
Total Protein: 6.6 g/dL (ref 6.0–8.5)
eGFR: 86 mL/min/{1.73_m2} (ref >59)

## 2023-05-01 LAB — HEMOGLOBIN A1C
Est. average glucose Bld gHb Est-mCnc: 114 mg/dL
Hgb A1c MFr Bld: 5.6 % (ref 4.8–5.6)

## 2023-05-01 LAB — INSULIN, RANDOM: INSULIN: 14.6 u[IU]/mL (ref 2.6–24.9)

## 2023-05-01 LAB — LIPID PANEL WITH LDL/HDL RATIO
Cholesterol, Total: 211 mg/dL — ABNORMAL HIGH (ref 100–199)
HDL: 63 mg/dL (ref >39)
LDL Chol Calc (NIH): 134 mg/dL — ABNORMAL HIGH (ref 0–99)
LDL/HDL Ratio: 2.1 ratio (ref 0.0–3.6)
Triglycerides: 81 mg/dL (ref 0–149)
VLDL Cholesterol Cal: 14 mg/dL (ref 5–40)

## 2023-05-01 LAB — VITAMIN D 25 HYDROXY (VIT D DEFICIENCY, FRACTURES): Vit D, 25-Hydroxy: 33.1 ng/mL (ref 30.0–100.0)

## 2023-05-13 ENCOUNTER — Other Ambulatory Visit (INDEPENDENT_AMBULATORY_CARE_PROVIDER_SITE_OTHER): Payer: Self-pay | Admitting: Family Medicine

## 2023-05-13 ENCOUNTER — Other Ambulatory Visit (HOSPITAL_BASED_OUTPATIENT_CLINIC_OR_DEPARTMENT_OTHER): Payer: Self-pay

## 2023-05-13 DIAGNOSIS — E559 Vitamin D deficiency, unspecified: Secondary | ICD-10-CM

## 2023-05-13 MED ORDER — VITAMIN D (ERGOCALCIFEROL) 1.25 MG (50000 UNIT) PO CAPS
50000.0000 [IU] | ORAL_CAPSULE | ORAL | 0 refills | Status: DC
Start: 2023-05-13 — End: 2023-06-05
  Filled 2023-05-13: qty 12, 84d supply, fill #0

## 2023-05-13 NOTE — Telephone Encounter (Signed)
LAST APPOINTMENT DATE: 04/30/2023 NEXT APPOINTMENT DATE: 06/05/2023   [facility] [place] [facility] [address] [place] [address] Phone: [phone] Fax: [phone]  Patient is requesting a refill of the following medications: Requested Prescriptions   Pending Prescriptions Disp Refills   Vitamin D, Ergocalciferol, (DRISDOL) 1.25 MG (50000 UNIT) CAPS capsule 12 capsule 0    Sig: Take 1 capsule (50,000 Units total) by mouth every 7 (seven) days.    Date last filled: 04/02/2023 Previously prescribed by [name]  Lab Results  Component Value Date   HGBA1C 5.6 04/30/2023   HGBA1C 5.3 01/08/2023   HGBA1C 6.0 (H) 07/30/2022   Lab Results  Component Value Date   LDLCALC 134 (H) 04/30/2023   CREATININE 1.01 04/30/2023   Lab Results  Component Value Date   VD25OH 33.1 04/30/2023   VD25OH 44.6 01/08/2023   VD25OH 35.5 07/30/2022    BP Readings from Last 3 Encounters:  04/30/23 111/67  03/13/23 107/66  02/05/23 116/64

## 2023-05-14 ENCOUNTER — Other Ambulatory Visit (HOSPITAL_BASED_OUTPATIENT_CLINIC_OR_DEPARTMENT_OTHER): Payer: Self-pay

## 2023-05-21 ENCOUNTER — Other Ambulatory Visit (HOSPITAL_BASED_OUTPATIENT_CLINIC_OR_DEPARTMENT_OTHER): Payer: Self-pay

## 2023-05-21 ENCOUNTER — Other Ambulatory Visit (INDEPENDENT_AMBULATORY_CARE_PROVIDER_SITE_OTHER): Payer: Self-pay | Admitting: Family Medicine

## 2023-05-21 DIAGNOSIS — F3289 Other specified depressive episodes: Secondary | ICD-10-CM

## 2023-05-21 DIAGNOSIS — G4709 Other insomnia: Secondary | ICD-10-CM

## 2023-05-22 ENCOUNTER — Other Ambulatory Visit (HOSPITAL_BASED_OUTPATIENT_CLINIC_OR_DEPARTMENT_OTHER): Payer: Self-pay

## 2023-05-23 ENCOUNTER — Other Ambulatory Visit (HOSPITAL_BASED_OUTPATIENT_CLINIC_OR_DEPARTMENT_OTHER): Payer: Self-pay

## 2023-05-30 ENCOUNTER — Other Ambulatory Visit (HOSPITAL_BASED_OUTPATIENT_CLINIC_OR_DEPARTMENT_OTHER): Payer: Self-pay

## 2023-05-31 ENCOUNTER — Other Ambulatory Visit: Payer: Self-pay

## 2023-05-31 ENCOUNTER — Other Ambulatory Visit (HOSPITAL_BASED_OUTPATIENT_CLINIC_OR_DEPARTMENT_OTHER): Payer: Self-pay | Admitting: Adult Health

## 2023-05-31 ENCOUNTER — Other Ambulatory Visit (HOSPITAL_BASED_OUTPATIENT_CLINIC_OR_DEPARTMENT_OTHER): Payer: Self-pay

## 2023-06-04 ENCOUNTER — Other Ambulatory Visit (HOSPITAL_BASED_OUTPATIENT_CLINIC_OR_DEPARTMENT_OTHER): Payer: Self-pay

## 2023-06-05 ENCOUNTER — Encounter (INDEPENDENT_AMBULATORY_CARE_PROVIDER_SITE_OTHER): Payer: Self-pay | Admitting: Family Medicine

## 2023-06-05 ENCOUNTER — Ambulatory Visit (INDEPENDENT_AMBULATORY_CARE_PROVIDER_SITE_OTHER): Payer: Managed Care, Other (non HMO) | Admitting: Family Medicine

## 2023-06-05 ENCOUNTER — Other Ambulatory Visit (HOSPITAL_BASED_OUTPATIENT_CLINIC_OR_DEPARTMENT_OTHER): Payer: Self-pay

## 2023-06-05 VITALS — BP 108/63 | HR 71 | Temp 98.2°F | Ht 74.0 in | Wt 299.0 lb

## 2023-06-05 DIAGNOSIS — Z6838 Body mass index (BMI) 38.0-38.9, adult: Secondary | ICD-10-CM | POA: Diagnosis not present

## 2023-06-05 DIAGNOSIS — E669 Obesity, unspecified: Secondary | ICD-10-CM

## 2023-06-05 DIAGNOSIS — E559 Vitamin D deficiency, unspecified: Secondary | ICD-10-CM

## 2023-06-05 DIAGNOSIS — E1169 Type 2 diabetes mellitus with other specified complication: Secondary | ICD-10-CM | POA: Diagnosis not present

## 2023-06-05 DIAGNOSIS — Z7985 Long-term (current) use of injectable non-insulin antidiabetic drugs: Secondary | ICD-10-CM

## 2023-06-05 MED ORDER — SEMAGLUTIDE (2 MG/DOSE) 8 MG/3ML ~~LOC~~ SOPN
2.0000 mg | PEN_INJECTOR | SUBCUTANEOUS | 0 refills | Status: DC
Start: 2023-06-05 — End: 2023-07-09
  Filled 2023-06-05: qty 3, 28d supply, fill #0

## 2023-06-05 MED ORDER — VITAMIN D (ERGOCALCIFEROL) 1.25 MG (50000 UNIT) PO CAPS
50000.0000 [IU] | ORAL_CAPSULE | ORAL | 0 refills | Status: DC
Start: 2023-06-05 — End: 2023-07-09
  Filled 2023-06-05: qty 5, 35d supply, fill #0

## 2023-06-05 NOTE — Progress Notes (Unsigned)
Chief Complaint:   OBESITY Luis Russell is here to discuss his progress with his obesity treatment plan along with follow-up of his obesity related diagnoses. Luis Russell is on the Category 3 Plan and states he is following his eating plan approximately 65% of the time. Luis Russell states he is doing yard work 3-4 times per week.    Today's visit was #: 35 Starting weight: 342 lbs Starting date: 03/24/2020 Today's weight: 299 lbs Today's date: 06/05/2023 Total lbs lost to date: 43 Total lbs lost since last in-office visit: 2  Interim History: Patient has increased his activity this summer.  He is working on increasing meal planning and he has decreased eating out.  He feels he is meeting his protein goals and his hunger is controlled.  Subjective:   1. Vitamin D deficiency Patient is on vitamin D prescription, and his recent vitamin D level was below goal.  2. Type 2 diabetes mellitus with other specified complication, without long-term current use of insulin (HCC) Patient is on Ozempic 2 mg, and he denies nausea or vomiting.  His recent A1c was 5.6 and he is well-controlled with his medications and diet.  Assessment/Plan:   1. Vitamin D deficiency Patient will continue prescription vitamin D once weekly, and we will refill for 1 month.  We will recheck labs in 3 months.  - Vitamin D, Ergocalciferol, (DRISDOL) 1.25 MG (50000 UNIT) CAPS capsule; Take 1 capsule (50,000 Units total) by mouth every 7 (seven) days.  Dispense: 5 capsule; Refill: 0  2. Type 2 diabetes mellitus with other specified complication, without long-term current use of insulin (HCC) We will refill Ozempic 2 mg once weekly for 1 month.  Patient will continue with his diet, and we will recheck labs in 3 months.  - Semaglutide, 2 MG/DOSE, 8 MG/3ML SOPN; Inject 2 mg as directed once a week.  Dispense: 3 mL; Refill: 0  3. BMI 38.0-38.9,adult  4. Obesity, Beginning BMI 43.9 Luis Russell is currently in the action stage of change. As  such, his goal is to continue with weight loss efforts. He has agreed to the Category 3 Plan.   Exercise goals: As is.   Behavioral modification strategies: increasing lean protein intake.  Jari has agreed to follow-up with our clinic in 4 weeks. He was informed of the importance of frequent follow-up visits to maximize his success with intensive lifestyle modifications for his multiple health conditions.   Objective:   Blood pressure 108/63, pulse 71, temperature 98.2 F (36.8 C), height 6\' 2"  (1.88 m), weight 299 lb (135.6 kg), SpO2 94%. Body mass index is 38.39 kg/m.  Lab Results  Component Value Date   CREATININE 1.01 04/30/2023   BUN 17 04/30/2023   NA 139 04/30/2023   K 4.8 04/30/2023   CL 102 04/30/2023   CO2 25 04/30/2023   Lab Results  Component Value Date   ALT 30 04/30/2023   AST 25 04/30/2023   ALKPHOS 62 04/30/2023   BILITOT 0.6 04/30/2023   Lab Results  Component Value Date   HGBA1C 5.6 04/30/2023   HGBA1C 5.3 01/08/2023   HGBA1C 6.0 (H) 07/30/2022   HGBA1C 6.2 (H) 11/09/2021   HGBA1C 5.6 12/08/2020   Lab Results  Component Value Date   INSULIN 14.6 04/30/2023   INSULIN 20.1 01/08/2023   INSULIN 37.2 (H) 07/30/2022   INSULIN 23.1 11/09/2021   INSULIN 16.9 12/08/2020   Lab Results  Component Value Date   TSH 3.240 03/24/2020   Lab Results  Component Value Date   CHOL 211 (H) 04/30/2023   HDL 63 04/30/2023   LDLCALC 134 (H) 04/30/2023   TRIG 81 04/30/2023   CHOLHDL 4.5 12/08/2020   Lab Results  Component Value Date   VD25OH 33.1 04/30/2023   VD25OH 44.6 01/08/2023   VD25OH 35.5 07/30/2022   Lab Results  Component Value Date   WBC 7.4 03/24/2020   HGB 13.4 03/24/2020   HCT 41.1 03/24/2020   MCV 90 03/24/2020   PLT 210 03/24/2020   No results found for: "IRON", "TIBC", "FERRITIN"  Attestation Statements:   Reviewed by clinician on day of visit: allergies, medications, problem list, medical history, surgical history, family  history, social history, and previous encounter notes.   I, Burt Knack, am acting as transcriptionist for [name], MD.  I have reviewed the above documentation for accuracy and completeness, and I agree with the above. -  [name], MD

## 2023-06-10 ENCOUNTER — Other Ambulatory Visit (HOSPITAL_BASED_OUTPATIENT_CLINIC_OR_DEPARTMENT_OTHER): Payer: Self-pay

## 2023-06-10 ENCOUNTER — Ambulatory Visit: Payer: 59 | Admitting: Psychiatry

## 2023-06-10 DIAGNOSIS — F331 Major depressive disorder, recurrent, moderate: Secondary | ICD-10-CM | POA: Diagnosis not present
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
## 2023-06-12 ENCOUNTER — Other Ambulatory Visit (HOSPITAL_BASED_OUTPATIENT_CLINIC_OR_DEPARTMENT_OTHER): Payer: Self-pay | Admitting: Adult Health

## 2023-06-12 ENCOUNTER — Other Ambulatory Visit (HOSPITAL_BASED_OUTPATIENT_CLINIC_OR_DEPARTMENT_OTHER): Payer: Self-pay

## 2023-06-12 MED ORDER — TADALAFIL 20 MG PO TABS
20.0000 mg | ORAL_TABLET | ORAL | 3 refills | Status: AC | PRN
  Filled 2023-06-12: qty 6, 30d supply, fill #0
  Filled 2023-07-19: qty 6, 30d supply, fill #1
  Filled 2024-05-09: qty 6, 30d supply, fill #2

## 2023-06-17 ENCOUNTER — Other Ambulatory Visit (HOSPITAL_BASED_OUTPATIENT_CLINIC_OR_DEPARTMENT_OTHER): Payer: Self-pay

## 2023-06-21 ENCOUNTER — Other Ambulatory Visit (HOSPITAL_BASED_OUTPATIENT_CLINIC_OR_DEPARTMENT_OTHER): Payer: Self-pay

## 2023-06-21 MED ORDER — TESTOSTERONE CYPIONATE 200 MG/ML IM SOLN
100.0000 mg | INTRAMUSCULAR | 1 refills | Status: DC
  Filled 2023-06-21: qty 2, 28d supply, fill #0
  Filled 2023-07-05: qty 2, 14d supply, fill #0

## 2023-06-24 ENCOUNTER — Ambulatory Visit: Payer: 59 | Admitting: Psychiatry

## 2023-06-24 ENCOUNTER — Encounter (INDEPENDENT_AMBULATORY_CARE_PROVIDER_SITE_OTHER): Payer: Self-pay | Admitting: Family Medicine

## 2023-06-24 DIAGNOSIS — F331 Major depressive disorder, recurrent, moderate: Secondary | ICD-10-CM

## 2023-06-24 NOTE — Progress Notes (Signed)
      [facility] Counselor/Therapist Progress Note  Patient ID: Luis Russell, MRN: [id number],    Date: 06/24/2023  Time Spent: 55 minutes   Treatment Type: Individual Therapy  Reported Symptoms:  depression especially "where I'm at in my life and feeling blah, no drive, and just another day", not further harm of self  Mental Status Exam:  Appearance:   Casual     Behavior:  Appropriate, Sharing, and Motivated  Motor:  Normal  Speech/Language:   Clear and Coherent  Affect:  Depressed  Mood:  anxious, depressed, and irritable  Thought process:  goal directed  Thought content:    WNL  Sensory/Perceptual disturbances:    WNL  Orientation:  oriented to person, place, time/date, situation, day of week, month of year, year, and stated date of June 24, 2023  Attention:  Good  Concentration:  Good  Memory:  WNL  Fund of knowledge:   Good  Insight:    Good and Fair  Judgment:   Good  Impulse Control:  Good   Risk Assessment: Danger to Self:  No Self-injurious Behavior: No Danger to Others: No Duty to Warn:no Physical Aggression / Violence:No  Access to Firearms a concern: No  Gang Involvement:No   Subjective:  Patient in for appointment today and reports feeling blah, not energetic, depression, anger, anxiety, sometime worthless, not happy. Denies any SI. When asked how he'd like to feel, he responded he'd like to feel happier, more energetic.  Affect is more depressed and irritable.  Difficulty trusting, insecure, hard to meet new people, hard to follow through on job applications, has tended to cut family and others off. Situation with adult kids has some challenges with poor limit-setting. Wife not very supportive.Patient feeling used by other family members. 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 Worked on his depressive thoughts, anger and resentment, and assuming "the worst", and being closed off to anything positive. Homework given re: talking with family, following up  information regarding employment, and follow-through on med eval appointment as he is felt current meds have not been helpful from outside providers (nonpsychiatric) in the past.  Agrees to meet appointment with med provider here at our office.   Interventions: Cognitive Behavioral Therapy and Ego-Supportive  Elevate mood and show evidence of usual energy. Develop healthy cognitive patterns and beliefs about himself and the world that lead to alleviation and help prevent the relapse of depressive symptoms. Alleviate depressed mood and return to previous level of effective functioning.  Diagnosis:   ICD-10-CM   1. Major depressive disorder, recurrent episode, moderate (HCC)  F33.1      Plan:  Patient in for appointment today and showing good participation and working on motivation that will certainly support is making progress. A step in the right direction for him is that he has sought out therapy.  Did show a little more motivation today.  Goal review and progress/challenges noted with patient.  Next appt within 2 to 3 weeks.   Mathis Fare, LCSW

## 2023-06-25 ENCOUNTER — Other Ambulatory Visit (HOSPITAL_BASED_OUTPATIENT_CLINIC_OR_DEPARTMENT_OTHER): Payer: Self-pay

## 2023-07-02 ENCOUNTER — Ambulatory Visit: Payer: 59 | Admitting: Adult Health

## 2023-07-02 ENCOUNTER — Other Ambulatory Visit (HOSPITAL_BASED_OUTPATIENT_CLINIC_OR_DEPARTMENT_OTHER): Payer: Self-pay

## 2023-07-02 MED ORDER — AZITHROMYCIN 250 MG PO TABS
ORAL_TABLET | ORAL | 0 refills | Status: AC
  Filled 2023-07-02: qty 6, 5d supply, fill #0

## 2023-07-05 ENCOUNTER — Other Ambulatory Visit (HOSPITAL_BASED_OUTPATIENT_CLINIC_OR_DEPARTMENT_OTHER): Payer: Self-pay

## 2023-07-08 ENCOUNTER — Ambulatory Visit: Payer: 59 | Admitting: Psychiatry

## 2023-07-09 ENCOUNTER — Other Ambulatory Visit (HOSPITAL_BASED_OUTPATIENT_CLINIC_OR_DEPARTMENT_OTHER): Payer: Self-pay

## 2023-07-09 ENCOUNTER — Encounter (INDEPENDENT_AMBULATORY_CARE_PROVIDER_SITE_OTHER): Payer: Self-pay | Admitting: Family Medicine

## 2023-07-09 ENCOUNTER — Ambulatory Visit (INDEPENDENT_AMBULATORY_CARE_PROVIDER_SITE_OTHER): Payer: Managed Care, Other (non HMO) | Admitting: Family Medicine

## 2023-07-09 VITALS — BP 101/64 | HR 70 | Temp 98.2°F | Ht 74.0 in | Wt 293.0 lb

## 2023-07-09 DIAGNOSIS — Z7985 Long-term (current) use of injectable non-insulin antidiabetic drugs: Secondary | ICD-10-CM

## 2023-07-09 DIAGNOSIS — E669 Obesity, unspecified: Secondary | ICD-10-CM

## 2023-07-09 DIAGNOSIS — E1169 Type 2 diabetes mellitus with other specified complication: Secondary | ICD-10-CM

## 2023-07-09 DIAGNOSIS — Z6837 Body mass index (BMI) 37.0-37.9, adult: Secondary | ICD-10-CM

## 2023-07-09 DIAGNOSIS — E559 Vitamin D deficiency, unspecified: Secondary | ICD-10-CM | POA: Diagnosis not present

## 2023-07-09 MED ORDER — VITAMIN D (ERGOCALCIFEROL) 1.25 MG (50000 UNIT) PO CAPS
50000.0000 [IU] | ORAL_CAPSULE | ORAL | 0 refills | Status: AC
Start: 2023-07-09 — End: ?
  Filled 2023-07-09: qty 4, 28d supply, fill #0
  Filled 2023-07-19: qty 12, 84d supply, fill #0

## 2023-07-09 MED ORDER — SEMAGLUTIDE (2 MG/DOSE) 8 MG/3ML ~~LOC~~ SOPN
2.0000 mg | PEN_INJECTOR | SUBCUTANEOUS | 0 refills | Status: DC
Start: 2023-07-09 — End: 2023-08-20
  Filled 2023-07-09: qty 3, 28d supply, fill #0

## 2023-07-09 NOTE — Progress Notes (Signed)
.smr  Office: [phone]  /  Fax: [phone]  WEIGHT SUMMARY AND BIOMETRICS  Anthropometric Measurements Height: 6\' 2"  (1.88 m) Weight: 293 lb (132.9 kg) BMI (Calculated): 37.6 Weight at Last Visit: 299 lb Weight Lost Since Last Visit: 6 lb Weight Gained Since Last Visit: 0 Starting Weight: 342 lb Total Weight Loss (lbs): 49 lb (22.2 kg)   Body Composition  Body Fat %: 37.2 % Fat Mass (lbs): 109.2 lbs Muscle Mass (lbs): 175.4 lbs Total Body Water (lbs): 124.2 lbs Visceral Fat Rating : 22   Other Clinical Data Fasting: No Labs: No Today's Visit #: 17 Starting Date: 03/24/20    Chief Complaint: OBESITY   Discussed the use of AI scribe software for clinical note transcription with the patient, who gave verbal consent to proceed.  History of Present Illness   The patient, a 57 year old individual with a history of obesity, DM II and vitamin D deficiency, presents for a follow-up visit. He reports a weight loss of six pounds over the past month, attributing this to adherence to a category three plan approximately 65% of the time. He has not engaged in formal exercise but has increased his physical activity through outdoor work.  The patient also mentions a recent colonoscopy, which was challenging due to the preparation process. The procedure identified a single polyp, but it was not deemed significant.  Regarding his hunger levels, the patient feels they are well-controlled. He initially thought he was consuming too much food, but it was mainly protein, including pork and hamburger. He denies having a large appetite or feeling excessively hungry between meals.  The patient is currently on Ozempic for diabetes and has not noticed any issues or fluctuations in blood sugar levels.    He also takes prescription vitamin D once a week but recent labs show he is below goal.  In terms of physical activity, the patient is quite active around the house, doing chores such as  cleaning the kitchen, doing laundry, and taking care of his dog. He recently undertook a [employer], which involved physically demanding tasks like removing wallpaper and painting.  The patient's recent labs showed a hemoglobin A1c of 5.6. He has lost more than fifty pounds since his first visit. The patient's goal is to reduce his visceral fat rating and increase his muscle mass.          PHYSICAL EXAM:  Blood pressure 101/64, pulse 70, temperature 98.2 F (36.8 C), height 6\' 2"  (1.88 m), weight 293 lb (132.9 kg), SpO2 96%. Body mass index is 37.62 kg/m.  DIAGNOSTIC DATA REVIEWED:  BMET    Component Value Date/Time   NA 139 04/30/2023 0741   K 4.8 04/30/2023 0741   CL 102 04/30/2023 0741   CO2 25 04/30/2023 0741   GLUCOSE 111 (H) 04/30/2023 0741   GLUCOSE 146 (H) 07/09/2016 1823   BUN 17 04/30/2023 0741   CREATININE 1.01 04/30/2023 0741   CALCIUM 9.1 04/30/2023 0741   GFRNONAA 75 12/08/2020 1402   GFRAA 87 12/08/2020 1402   Lab Results  Component Value Date   HGBA1C 5.6 04/30/2023   HGBA1C 5.8 (H) 06/08/2016   Lab Results  Component Value Date   INSULIN 14.6 04/30/2023   INSULIN 33.8 (H) 03/24/2020   Lab Results  Component Value Date   TSH 3.240 03/24/2020   CBC    Component Value Date/Time   WBC 7.4 03/24/2020 1234   WBC 8.9 07/09/2016 1823   RBC 4.56 03/24/2020 1234  RBC 4.51 07/09/2016 1823   HGB 13.4 03/24/2020 1234   HCT 41.1 03/24/2020 1234   PLT 210 03/24/2020 1234   MCV 90 03/24/2020 1234   MCH 29.4 03/24/2020 1234   MCH 29.3 07/09/2016 1823   MCHC 32.6 03/24/2020 1234   MCHC 32.1 07/09/2016 1823   RDW 13.0 03/24/2020 1234   Iron Studies No results found for: "IRON", "TIBC", "FERRITIN", "IRONPCTSAT" Lipid Panel     Component Value Date/Time   CHOL 211 (H) 04/30/2023 0741   TRIG 81 04/30/2023 0741   HDL 63 04/30/2023 0741   CHOLHDL 4.5 12/08/2020 1402   LDLCALC 134 (H) 04/30/2023 0741   Hepatic Function Panel      Component Value Date/Time   PROT 6.6 04/30/2023 0741   ALBUMIN 4.2 04/30/2023 0741   AST 25 04/30/2023 0741   ALT 30 04/30/2023 0741   ALKPHOS 62 04/30/2023 0741   BILITOT 0.6 04/30/2023 0741      Component Value Date/Time   TSH 3.240 03/24/2020 1234   Nutritional Lab Results  Component Value Date   VD25OH 33.1 04/30/2023   VD25OH 44.6 01/08/2023   VD25OH 35.5 07/30/2022     Assessment and Plan    Obesity Patient has lost 6 pounds in the last month and is following his category three plan 65% of the time. He is not currently doing any formal exercise but is working outside more. His visceral fat rating is in the 20s and muscle mass is decreasing slightly. -Continue with Category 3 diet. -Increase muscle strengthening activities.   DM II -Continue with Ozempic. -Check-in in 4 weeks.  Vitamin D deficiency Patient is on prescription vitamin D 150,000 IU per week. His last vitamin D level was 33. -Continue vitamin D 150,000 IU per week. -Refill prescription for vitamin D. -Check vitamin D level at next visit.        He was informed of the importance of frequent follow up visits to maximize his success with intensive lifestyle modifications for his multiple health conditions.    [name], MD

## 2023-07-19 ENCOUNTER — Other Ambulatory Visit (HOSPITAL_BASED_OUTPATIENT_CLINIC_OR_DEPARTMENT_OTHER): Payer: Self-pay

## 2023-07-19 ENCOUNTER — Other Ambulatory Visit: Payer: Self-pay

## 2023-07-20 ENCOUNTER — Other Ambulatory Visit (HOSPITAL_BASED_OUTPATIENT_CLINIC_OR_DEPARTMENT_OTHER): Payer: Self-pay

## 2023-07-20 MED ORDER — TESTOSTERONE CYPIONATE 200 MG/ML IM SOLN
INTRAMUSCULAR | 0 refills | Status: DC
  Filled 2023-07-20: qty 2, 28d supply, fill #0

## 2023-07-22 ENCOUNTER — Ambulatory Visit: Payer: 59 | Admitting: Psychiatry

## 2023-07-23 ENCOUNTER — Other Ambulatory Visit (HOSPITAL_BASED_OUTPATIENT_CLINIC_OR_DEPARTMENT_OTHER): Payer: Self-pay

## 2023-07-24 ENCOUNTER — Other Ambulatory Visit (HOSPITAL_BASED_OUTPATIENT_CLINIC_OR_DEPARTMENT_OTHER): Payer: Self-pay

## 2023-07-24 MED ORDER — TESTOSTERONE CYPIONATE 200 MG/ML IM SOLN
150.0000 mg | INTRAMUSCULAR | 1 refills | Status: AC
  Filled 2023-12-19: qty 3, 28d supply, fill #0

## 2023-07-25 ENCOUNTER — Telehealth: Payer: Self-pay | Admitting: *Deleted

## 2023-07-25 NOTE — Telephone Encounter (Signed)
Prior authorization done in cover my meds for Ozempic. Message per insurance.  Information regarding your request This medication or product was previously approved on [id number] from 2022-12-03 to 2023-12-04. **Please note: This request was submitted electronically. Formulary lowering, tiering exception, cost reduction and/or pre-benefit determination review (including prospective Medicare hospice reviews) requests cannot be requested using this method of submission. Providers contact us at [phone] for further assistance.

## 2023-07-30 ENCOUNTER — Other Ambulatory Visit (HOSPITAL_BASED_OUTPATIENT_CLINIC_OR_DEPARTMENT_OTHER): Payer: Self-pay

## 2023-07-30 MED ORDER — TESTOSTERONE CYPIONATE 200 MG/ML IM SOLN
150.0000 mg | INTRAMUSCULAR | 1 refills | Status: AC
  Filled 2023-07-30: qty 10, 90d supply, fill #0
  Filled 2023-08-15: qty 4, 28d supply, fill #0

## 2023-07-30 MED ORDER — BD ECLIPSE SHIELDED NEEDLE 18G X 1-1/2" MISC
3 refills | Status: AC
Start: 2023-07-30 — End: ?
  Filled 2024-04-13: qty 4, 28d supply, fill #0

## 2023-07-30 MED ORDER — BD SYRINGE SLIP TIP 3 ML MISC
3 refills | Status: AC
Start: 2023-07-30 — End: ?
  Filled 2024-04-13: qty 4, 28d supply, fill #0

## 2023-08-05 ENCOUNTER — Other Ambulatory Visit (HOSPITAL_BASED_OUTPATIENT_CLINIC_OR_DEPARTMENT_OTHER): Payer: Self-pay

## 2023-08-05 ENCOUNTER — Ambulatory Visit: Payer: 59 | Admitting: Psychiatry

## 2023-08-05 MED ORDER — CEFDINIR 300 MG PO CAPS
300.0000 mg | ORAL_CAPSULE | Freq: Two times a day (BID) | ORAL | 0 refills | Status: DC
  Filled 2023-08-05: qty 20, 10d supply, fill #0

## 2023-08-07 ENCOUNTER — Other Ambulatory Visit (HOSPITAL_BASED_OUTPATIENT_CLINIC_OR_DEPARTMENT_OTHER): Payer: Self-pay

## 2023-08-07 MED ORDER — AZITHROMYCIN 250 MG PO TABS
ORAL_TABLET | ORAL | 0 refills | Status: DC
  Filled 2023-08-07: qty 6, 5d supply, fill #0

## 2023-08-07 MED ORDER — CIPROFLOXACIN-DEXAMETHASONE 0.3-0.1 % OT SUSP
4.0000 [drp] | Freq: Two times a day (BID) | OTIC | 0 refills | Status: DC
  Filled 2023-08-07: qty 7.5, 19d supply, fill #0

## 2023-08-12 ENCOUNTER — Other Ambulatory Visit (HOSPITAL_BASED_OUTPATIENT_CLINIC_OR_DEPARTMENT_OTHER): Payer: Self-pay

## 2023-08-15 ENCOUNTER — Other Ambulatory Visit (HOSPITAL_BASED_OUTPATIENT_CLINIC_OR_DEPARTMENT_OTHER): Payer: Self-pay

## 2023-08-15 ENCOUNTER — Other Ambulatory Visit: Payer: Self-pay

## 2023-08-17 ENCOUNTER — Other Ambulatory Visit (HOSPITAL_BASED_OUTPATIENT_CLINIC_OR_DEPARTMENT_OTHER): Payer: Self-pay

## 2023-08-17 MED ORDER — BENZONATATE 200 MG PO CAPS
200.0000 mg | ORAL_CAPSULE | Freq: Three times a day (TID) | ORAL | 0 refills | Status: DC
  Filled 2023-08-17: qty 21, 7d supply, fill #0

## 2023-08-20 ENCOUNTER — Other Ambulatory Visit (HOSPITAL_BASED_OUTPATIENT_CLINIC_OR_DEPARTMENT_OTHER): Payer: Self-pay

## 2023-08-20 ENCOUNTER — Encounter (INDEPENDENT_AMBULATORY_CARE_PROVIDER_SITE_OTHER): Payer: Self-pay | Admitting: Family Medicine

## 2023-08-20 ENCOUNTER — Ambulatory Visit (INDEPENDENT_AMBULATORY_CARE_PROVIDER_SITE_OTHER): Payer: Managed Care, Other (non HMO) | Admitting: Family Medicine

## 2023-08-20 VITALS — BP 107/65 | HR 89 | Temp 98.2°F | Ht 74.0 in | Wt 303.0 lb

## 2023-08-20 DIAGNOSIS — E669 Obesity, unspecified: Secondary | ICD-10-CM

## 2023-08-20 DIAGNOSIS — E1169 Type 2 diabetes mellitus with other specified complication: Secondary | ICD-10-CM

## 2023-08-20 DIAGNOSIS — Z7985 Long-term (current) use of injectable non-insulin antidiabetic drugs: Secondary | ICD-10-CM

## 2023-08-20 DIAGNOSIS — K5900 Constipation, unspecified: Secondary | ICD-10-CM | POA: Diagnosis not present

## 2023-08-20 DIAGNOSIS — Z6838 Body mass index (BMI) 38.0-38.9, adult: Secondary | ICD-10-CM

## 2023-08-20 DIAGNOSIS — K5901 Slow transit constipation: Secondary | ICD-10-CM

## 2023-08-20 MED ORDER — SEMAGLUTIDE (2 MG/DOSE) 8 MG/3ML ~~LOC~~ SOPN
2.0000 mg | PEN_INJECTOR | SUBCUTANEOUS | 0 refills | Status: DC
  Filled 2023-08-20: qty 3, 28d supply, fill #0

## 2023-08-20 NOTE — Progress Notes (Signed)
.smr  Office: [phone]  /  Fax: [phone]  WEIGHT SUMMARY AND BIOMETRICS  Anthropometric Measurements Height: 6\' 2"  (1.88 m) Weight: (!) 303 lb (137.4 kg) BMI (Calculated): 38.89 Weight at Last Visit: 293 lb Weight Lost Since Last Visit: 0 Weight Gained Since Last Visit: 10 lb Starting Weight: 342 lb Total Weight Loss (lbs): 39 lb (17.7 kg)   Body Composition  Body Fat %: 37.6 % Fat Mass (lbs): 114 lbs Muscle Mass (lbs): 180 lbs Total Body Water (lbs): 126.8 lbs Visceral Fat Rating : 23   Other Clinical Data Fasting: No Labs: No Today's Visit #: 37 Starting Date: 03/24/20    Chief Complaint: OBESITY   History of Present Illness   The patient, with a history of obesity and type two diabetes, presents today for a follow-up visit. His most recent hemoglobin A1c, done in July, was 5.6. He is currently on Ozempic 2mg  weekly. However, he reports difficulty in measuring the correct dosage due to the pen's design. He has gained 10 pounds in the last five weeks and admits to adhering to his diet plan only 50% of the time. He is physically active, walking for 30 minutes three times a week.  The patient has been experiencing multiple ear infections and colds recently, which he attributes to the changing seasons. He reports feeling as if he is retaining fluid, which he believes may be related to his Ozempic dosage. He also reports constipation, which he manages by drinking water. He has not been taking any over-the-counter medications for this issue.  The patient has been dealing with a sinus and ear infection for a couple of weeks, which has not improved despite taking two courses of antibiotics and ear drops. He reports that this has significantly affected his daily activities, causing him to miss nearly a week of work. He has a history of chronic ear issues, including a retracted eardrum and scarring, and has had three sets of tubes placed in the past. He is currently under the  care of an ENT specialist for these issues.  The patient also reports a recent cold, which affected everyone in his household. He has tested negative for the flu, RSV, and COVID-19. He reports feeling better but is still dealing with residual symptoms. He has not been adhering to his medication regimen consistently due to feeling unwell.          PHYSICAL EXAM:  Blood pressure 107/65, pulse 89, temperature 98.2 F (36.8 C), height 6\' 2"  (1.88 m), weight (!) 303 lb (137.4 kg), SpO2 95%. Body mass index is 38.9 kg/m.  DIAGNOSTIC DATA REVIEWED:  BMET    Component Value Date/Time   NA 139 04/30/2023 0741   K 4.8 04/30/2023 0741   CL 102 04/30/2023 0741   CO2 25 04/30/2023 0741   GLUCOSE 111 (H) 04/30/2023 0741   GLUCOSE 146 (H) 07/09/2016 1823   BUN 17 04/30/2023 0741   CREATININE 1.01 04/30/2023 0741   CALCIUM 9.1 04/30/2023 0741   GFRNONAA 75 12/08/2020 1402   GFRAA 87 12/08/2020 1402   Lab Results  Component Value Date   HGBA1C 5.6 04/30/2023   HGBA1C 5.8 (H) 06/08/2016   Lab Results  Component Value Date   INSULIN 14.6 04/30/2023   INSULIN 33.8 (H) 03/24/2020   Lab Results  Component Value Date   TSH 3.240 03/24/2020   CBC    Component Value Date/Time   WBC 7.4 03/24/2020 1234   WBC 8.9 07/09/2016 1823   RBC 4.56 03/24/2020  1234   RBC 4.51 07/09/2016 1823   HGB 13.4 03/24/2020 1234   HCT 41.1 03/24/2020 1234   PLT 210 03/24/2020 1234   MCV 90 03/24/2020 1234   MCH 29.4 03/24/2020 1234   MCH 29.3 07/09/2016 1823   MCHC 32.6 03/24/2020 1234   MCHC 32.1 07/09/2016 1823   RDW 13.0 03/24/2020 1234   Iron Studies No results found for: "IRON", "TIBC", "FERRITIN", "IRONPCTSAT" Lipid Panel     Component Value Date/Time   CHOL 211 (H) 04/30/2023 0741   TRIG 81 04/30/2023 0741   HDL 63 04/30/2023 0741   CHOLHDL 4.5 12/08/2020 1402   LDLCALC 134 (H) 04/30/2023 0741   Hepatic Function Panel     Component Value Date/Time   PROT 6.6 04/30/2023 0741    ALBUMIN 4.2 04/30/2023 0741   AST 25 04/30/2023 0741   ALT 30 04/30/2023 0741   ALKPHOS 62 04/30/2023 0741   BILITOT 0.6 04/30/2023 0741      Component Value Date/Time   TSH 3.240 03/24/2020 1234   Nutritional Lab Results  Component Value Date   VD25OH 33.1 04/30/2023   VD25OH 44.6 01/08/2023   VD25OH 35.5 07/30/2022     Assessment and Plan    Obesity and Type 2 Diabetes Recent weight gain of 10 pounds in the last 5 weeks. Hemoglobin A1c in July was 5.6. Currently on Ozempic 2mg  weekly. Difficulty with correct dosing due to measurement confusion. Constipation reported, likely related to Ozempic. -Increase Ozempic to full 2mg  dose weekly. -Start daily MiraLAX for constipation, half dose suggested. -Continue category three diet plan, aim for full adherence. -Continue walking for 30 minutes three times a week. -Check in before Thanksgiving, approximately 4 weeks from now.   Constipation mild, trying to increase H2O -Start daily MiraLAX for constipation, half dose suggested daily to start with.  Recurrent Ear Infections Recent multiple ear infections and colds. History of chronic ear issues and previous tube placements. Currently under care of an ENT specialist. -Consider seeing a different ENT specialist for a second opinion. -If current infection persists, see primary care or urgent care by the end of the week.         He was informed of the importance of frequent follow up visits to maximize his success with intensive lifestyle modifications for his multiple health conditions.    [name], MD

## 2023-08-30 ENCOUNTER — Other Ambulatory Visit: Payer: Self-pay

## 2023-08-30 ENCOUNTER — Other Ambulatory Visit (HOSPITAL_BASED_OUTPATIENT_CLINIC_OR_DEPARTMENT_OTHER): Payer: Self-pay

## 2023-08-30 MED ORDER — CIPROFLOXACIN-DEXAMETHASONE 0.3-0.1 % OT SUSP
4.0000 [drp] | Freq: Two times a day (BID) | OTIC | 0 refills | Status: AC
Start: 2023-08-30 — End: 2023-09-09
  Filled 2023-08-30: qty 7.5, 7d supply, fill #0

## 2023-08-30 MED ORDER — CLINDAMYCIN HCL 300 MG PO CAPS
600.0000 mg | ORAL_CAPSULE | Freq: Two times a day (BID) | ORAL | 0 refills | Status: AC
  Filled 2023-08-30 – 2023-08-31 (×2): qty 40, 10d supply, fill #0

## 2023-08-30 MED ORDER — FLONASE SENSIMIST 27.5 MCG/SPRAY NA SUSP
2.0000 | Freq: Every day | NASAL | 5 refills | Status: AC
  Filled 2023-08-30: qty 5.9, 30d supply, fill #0

## 2023-08-31 ENCOUNTER — Other Ambulatory Visit (HOSPITAL_BASED_OUTPATIENT_CLINIC_OR_DEPARTMENT_OTHER): Payer: Self-pay

## 2023-08-31 ENCOUNTER — Encounter (INDEPENDENT_AMBULATORY_CARE_PROVIDER_SITE_OTHER): Payer: Self-pay | Admitting: Family Medicine

## 2023-09-02 ENCOUNTER — Other Ambulatory Visit: Payer: Self-pay

## 2023-09-02 ENCOUNTER — Other Ambulatory Visit (HOSPITAL_BASED_OUTPATIENT_CLINIC_OR_DEPARTMENT_OTHER): Payer: Self-pay

## 2023-09-03 ENCOUNTER — Other Ambulatory Visit (HOSPITAL_BASED_OUTPATIENT_CLINIC_OR_DEPARTMENT_OTHER): Payer: Self-pay

## 2023-09-04 ENCOUNTER — Other Ambulatory Visit (HOSPITAL_BASED_OUTPATIENT_CLINIC_OR_DEPARTMENT_OTHER): Payer: Self-pay

## 2023-09-04 MED ORDER — TADALAFIL 20 MG PO TABS
20.0000 mg | ORAL_TABLET | Freq: Every day | ORAL | 3 refills | Status: AC | PRN
  Filled 2023-09-04: qty 15, 15d supply, fill #0
  Filled 2023-11-19: qty 15, 15d supply, fill #1
  Filled 2024-05-09: qty 15, 15d supply, fill #2

## 2023-09-16 ENCOUNTER — Other Ambulatory Visit (HOSPITAL_BASED_OUTPATIENT_CLINIC_OR_DEPARTMENT_OTHER): Payer: Self-pay

## 2023-09-16 MED ORDER — "BD ECLIPSE SHIELDED NEEDLE 18G X 1-1/2"" MISC"
3 refills | Status: DC
  Filled 2023-09-16: qty 4, 28d supply, fill #0
  Filled 2024-06-26: qty 4, 28d supply, fill #1

## 2023-09-16 MED ORDER — BD SYRINGE SLIP TIP 3 ML MISC
3 refills | Status: DC
  Filled 2023-09-16: qty 4, 28d supply, fill #0

## 2023-09-16 MED ORDER — TESTOSTERONE CYPIONATE 200 MG/ML IM SOLN
INTRAMUSCULAR | 1 refills | Status: AC
Start: 2023-09-16 — End: ?
  Filled 2023-09-16: qty 4, 28d supply, fill #0
  Filled 2023-11-19: qty 4, 28d supply, fill #1
  Filled 2023-12-19 – 2023-12-27 (×3): qty 4, 28d supply, fill #2

## 2023-09-17 ENCOUNTER — Other Ambulatory Visit: Payer: Self-pay

## 2023-09-17 ENCOUNTER — Encounter (INDEPENDENT_AMBULATORY_CARE_PROVIDER_SITE_OTHER): Payer: Self-pay | Admitting: Family Medicine

## 2023-09-17 ENCOUNTER — Other Ambulatory Visit (HOSPITAL_BASED_OUTPATIENT_CLINIC_OR_DEPARTMENT_OTHER): Payer: Self-pay

## 2023-09-17 ENCOUNTER — Ambulatory Visit (INDEPENDENT_AMBULATORY_CARE_PROVIDER_SITE_OTHER): Payer: Managed Care, Other (non HMO) | Admitting: Family Medicine

## 2023-09-17 VITALS — BP 120/68 | HR 78 | Temp 98.3°F | Ht 74.0 in | Wt 304.0 lb

## 2023-09-17 DIAGNOSIS — E1169 Type 2 diabetes mellitus with other specified complication: Secondary | ICD-10-CM

## 2023-09-17 DIAGNOSIS — Z6839 Body mass index (BMI) 39.0-39.9, adult: Secondary | ICD-10-CM

## 2023-09-17 DIAGNOSIS — Z7985 Long-term (current) use of injectable non-insulin antidiabetic drugs: Secondary | ICD-10-CM | POA: Diagnosis not present

## 2023-09-17 DIAGNOSIS — E669 Obesity, unspecified: Secondary | ICD-10-CM

## 2023-09-17 MED ORDER — SEMAGLUTIDE (2 MG/DOSE) 8 MG/3ML ~~LOC~~ SOPN
2.0000 mg | PEN_INJECTOR | SUBCUTANEOUS | 0 refills | Status: DC
  Filled 2023-09-17: qty 3, 28d supply, fill #0

## 2023-09-17 NOTE — Progress Notes (Signed)
.smr  Office: [phone]  /  Fax: [phone]  WEIGHT SUMMARY AND BIOMETRICS  Anthropometric Measurements Height: 6\' 2"  (1.88 m) Weight: (!) 304 lb (137.9 kg) BMI (Calculated): 39.01 Weight at Last Visit: 303 lb Weight Lost Since Last Visit: 0 Weight Gained Since Last Visit: 1 lb Starting Weight: 342 lb Total Weight Loss (lbs): 38 lb (17.2 kg)   Body Composition  Body Fat %: 37.7 % Fat Mass (lbs): 114.8 lbs Muscle Mass (lbs): 180.4 lbs Total Body Water (lbs): 126.6 lbs Visceral Fat Rating : 23   Other Clinical Data Fasting: No Labs: No Today's Visit #: 56 Starting Date: 03/24/20    Chief Complaint: OBESITY    History of Present Illness   The patient, diagnosed with type two diabetes and obesity, presents for a follow-up consultation. He reports a recent weight gain of one pound over the last month, despite adherence to a category three eating plan approximately sixty percent of the time and regular physical activity, including walking his dog three to four times per week for twenty to thirty minutes and doing yard work. The patient is currently on Ozempic for diabetes management, with a recent hemoglobin A1c of 5.6, indicating good control.  The patient has been experiencing fatigue, which he attributes to early morning appointments. He reports feeling lighter despite the weight gain, suggesting possible fluid retention. He has been dealing with recurrent ear infections, currently managed with antibiotics, and has a specialist appointment scheduled for further evaluation.  The patient has recently started a higher dose of Ozempic (2mg ) and has noticed a slight decrease in appetite since the dosage increase. He reports trying to consume as much protein as possible, with a diet consisting of chicken, pork tenderloin, and steak. He also admits to a high intake of diet soda.  The patient is also on testosterone injections from his urologist outside of [facility], with a  recent increase in dosage. He self-administers the injections and has experienced some swelling and minor infection at the injection site. Despite these challenges, the patient reports feeling lighter and more active.          PHYSICAL EXAM:  Blood pressure 120/68, pulse 78, temperature 98.3 F (36.8 C), height 6\' 2"  (1.88 m), weight (!) 304 lb (137.9 kg), SpO2 98%. Body mass index is 39.03 kg/m.  DIAGNOSTIC DATA REVIEWED:  BMET    Component Value Date/Time   NA 139 04/30/2023 0741   K 4.8 04/30/2023 0741   CL 102 04/30/2023 0741   CO2 25 04/30/2023 0741   GLUCOSE 111 (H) 04/30/2023 0741   GLUCOSE 146 (H) 07/09/2016 1823   BUN 17 04/30/2023 0741   CREATININE 1.01 04/30/2023 0741   CALCIUM 9.1 04/30/2023 0741   GFRNONAA 75 12/08/2020 1402   GFRAA 87 12/08/2020 1402   Lab Results  Component Value Date   HGBA1C 5.6 04/30/2023   HGBA1C 5.8 (H) 06/08/2016   Lab Results  Component Value Date   INSULIN 14.6 04/30/2023   INSULIN 33.8 (H) 03/24/2020   Lab Results  Component Value Date   TSH 3.240 03/24/2020   CBC    Component Value Date/Time   WBC 7.4 03/24/2020 1234   WBC 8.9 07/09/2016 1823   RBC 4.56 03/24/2020 1234   RBC 4.51 07/09/2016 1823   HGB 13.4 03/24/2020 1234   HCT 41.1 03/24/2020 1234   PLT 210 03/24/2020 1234   MCV 90 03/24/2020 1234   MCH 29.4 03/24/2020 1234   MCH 29.3 07/09/2016 1823  MCHC 32.6 03/24/2020 1234   MCHC 32.1 07/09/2016 1823   RDW 13.0 03/24/2020 1234   Iron Studies No results found for: "IRON", "TIBC", "FERRITIN", "IRONPCTSAT" Lipid Panel     Component Value Date/Time   CHOL 211 (H) 04/30/2023 0741   TRIG 81 04/30/2023 0741   HDL 63 04/30/2023 0741   CHOLHDL 4.5 12/08/2020 1402   LDLCALC 134 (H) 04/30/2023 0741   Hepatic Function Panel     Component Value Date/Time   PROT 6.6 04/30/2023 0741   ALBUMIN 4.2 04/30/2023 0741   AST 25 04/30/2023 0741   ALT 30 04/30/2023 0741   ALKPHOS 62 04/30/2023 0741   BILITOT 0.6  04/30/2023 0741      Component Value Date/Time   TSH 3.240 03/24/2020 1234   Nutritional Lab Results  Component Value Date   VD25OH 33.1 04/30/2023   VD25OH 44.6 01/08/2023   VD25OH 35.5 07/30/2022     Assessment and Plan    Type 2 Diabetes Mellitus Well-controlled with current regimen. Hemoglobin A1c is 5.6. Currently on Ozempic 2 mg weekly. Discussed potential switch to [facility] for better weight loss outcomes. Patient prefers to continue with Ozempic for now and reassess later. - Refill Ozempic 2 mg - Reassess after the holidays to evaluate effectiveness and consider potential switch to [facility] if needed  Obesity Slight weight gain of 1 pound over the last month. Following a category three eating plan about 60% of the time and engaging in regular physical activity. Increased muscle mass may account for weight gain. Patient feels lighter and more active. - Continue current diet and exercise regimen - Reassess weight and muscle mass after the holidays  Hypogonadism Managed with testosterone injections. Dose recently increased to 1.0 mL weekly. Reports occasional swelling and irritation at the injection site. Issues with self-administration technique leading to minor infections. - Continue current testosterone regimen as prescribed by his urologist. - Monitor for injection site reactions and ensure proper injection technique   Follow-up - Schedule follow-up appointment in four weeks        He was informed of the importance of frequent follow up visits to maximize his success with intensive lifestyle modifications for his multiple health conditions.    [name], MD

## 2023-09-30 DIAGNOSIS — M26622 Arthralgia of left temporomandibular joint: Secondary | ICD-10-CM | POA: Insufficient documentation

## 2023-09-30 DIAGNOSIS — H9202 Otalgia, left ear: Secondary | ICD-10-CM | POA: Insufficient documentation

## 2023-10-11 ENCOUNTER — Other Ambulatory Visit (HOSPITAL_BASED_OUTPATIENT_CLINIC_OR_DEPARTMENT_OTHER): Payer: Self-pay

## 2023-10-11 MED ORDER — BD ECLIPSE SHIELDED NEEDLE 18G X 1-1/2" MISC
3 refills | Status: DC
  Filled 2023-10-11: qty 15, 30d supply, fill #0

## 2023-10-11 MED ORDER — BD SYRINGE SLIP TIP 3 ML MISC: 3 refills | Status: DC

## 2023-10-11 MED ORDER — TESTOSTERONE CYPIONATE 200 MG/ML IM SOLN
INTRAMUSCULAR | 1 refills | Status: DC
  Filled 2023-10-11 – 2023-10-18 (×2): qty 4, 28d supply, fill #0
  Filled 2023-12-19 (×3): qty 4, 28d supply, fill #1
  Filled 2023-12-20: qty 1, 7d supply, fill #1
  Filled 2024-01-24: qty 4, 28d supply, fill #2
  Filled 2024-02-19: qty 4, 28d supply, fill #3
  Filled 2024-03-20: qty 4, 28d supply, fill #4

## 2023-10-16 ENCOUNTER — Ambulatory Visit (INDEPENDENT_AMBULATORY_CARE_PROVIDER_SITE_OTHER): Payer: Managed Care, Other (non HMO) | Admitting: Family Medicine

## 2023-10-18 ENCOUNTER — Other Ambulatory Visit (HOSPITAL_BASED_OUTPATIENT_CLINIC_OR_DEPARTMENT_OTHER): Payer: Self-pay

## 2023-10-18 ENCOUNTER — Other Ambulatory Visit: Payer: Self-pay

## 2023-11-19 ENCOUNTER — Other Ambulatory Visit (HOSPITAL_BASED_OUTPATIENT_CLINIC_OR_DEPARTMENT_OTHER): Payer: Self-pay

## 2023-11-22 ENCOUNTER — Other Ambulatory Visit (HOSPITAL_BASED_OUTPATIENT_CLINIC_OR_DEPARTMENT_OTHER): Payer: Self-pay

## 2023-12-04 ENCOUNTER — Encounter (INDEPENDENT_AMBULATORY_CARE_PROVIDER_SITE_OTHER): Payer: Self-pay | Admitting: Family Medicine

## 2023-12-04 ENCOUNTER — Ambulatory Visit (INDEPENDENT_AMBULATORY_CARE_PROVIDER_SITE_OTHER): Payer: BC Managed Care – PPO | Admitting: Family Medicine

## 2023-12-04 ENCOUNTER — Other Ambulatory Visit (HOSPITAL_BASED_OUTPATIENT_CLINIC_OR_DEPARTMENT_OTHER): Payer: Self-pay

## 2023-12-04 VITALS — BP 119/72 | HR 79 | Temp 98.0°F | Ht 74.0 in | Wt 301.0 lb

## 2023-12-04 DIAGNOSIS — F32A Depression, unspecified: Secondary | ICD-10-CM

## 2023-12-04 DIAGNOSIS — Z6838 Body mass index (BMI) 38.0-38.9, adult: Secondary | ICD-10-CM

## 2023-12-04 DIAGNOSIS — E1169 Type 2 diabetes mellitus with other specified complication: Secondary | ICD-10-CM

## 2023-12-04 DIAGNOSIS — E119 Type 2 diabetes mellitus without complications: Secondary | ICD-10-CM | POA: Diagnosis not present

## 2023-12-04 DIAGNOSIS — E669 Obesity, unspecified: Secondary | ICD-10-CM

## 2023-12-04 DIAGNOSIS — E349 Endocrine disorder, unspecified: Secondary | ICD-10-CM | POA: Diagnosis not present

## 2023-12-04 DIAGNOSIS — F3289 Other specified depressive episodes: Secondary | ICD-10-CM

## 2023-12-04 DIAGNOSIS — Z7985 Long-term (current) use of injectable non-insulin antidiabetic drugs: Secondary | ICD-10-CM

## 2023-12-04 MED ORDER — SEMAGLUTIDE (2 MG/DOSE) 8 MG/3ML ~~LOC~~ SOPN
2.0000 mg | PEN_INJECTOR | SUBCUTANEOUS | 1 refills | Status: DC
  Filled 2023-12-04: qty 3, 28d supply, fill #0

## 2023-12-04 NOTE — Progress Notes (Signed)
 .smr  Office: [phone]  /  Fax: [phone]  WEIGHT SUMMARY AND BIOMETRICS  Anthropometric Measurements Height: 6' 2 (1.88 m) Weight: (!) 301 lb (136.5 kg) BMI (Calculated): 38.63 Weight at Last Visit: 304 lb Weight Lost Since Last Visit: 3 lb Total Weight Loss (lbs): 41 lb (18.6 kg)   Body Composition  Body Fat %: 37.7 % Fat Mass (lbs): 113.4 lbs Muscle Mass (lbs): 178.4 lbs Total Body Water (lbs): 126.4 lbs Visceral Fat Rating : 23   Other Clinical Data Fasting: Yes Labs: No Today's Visit #: 39    Chief Complaint: OBESITY   Discussed the use of AI scribe software for clinical note transcription with the patient, who gave verbal consent to proceed.  History of Present Illness   Luis Russell is a 58 year old male with obesity and type two diabetes who presents for weight management and diabetes follow-up.  He has lost three pounds over the last three months, despite the challenges of the holiday season. He follows a category three and four eating plan about fifty percent of the time and engages in physical activities such as yard work and walking his dog most days of the week. He is very active, managing household chores and taking care of his dog. He works from home and maintains a busy lifestyle, following a structured daily routine, going to bed around 9:00-10:30 PM and waking up at 5:30 AM.  He has a history of type two diabetes, which is well-controlled with a hemoglobin A1c of 5.6 at the last check. He is on Ozempic  2 mg weekly and is working on diet, exercise, and weight loss to improve his blood sugar levels. He drinks a lot of water, which he attributes to his medication and testosterone  use.  He is on testosterone  therapy, which he started in 2021 due to low levels. Testosterone  has significantly improved his energy levels and activity. He is monitored by a urology clinic, and his testosterone  levels have increased from the 200s to around 900. His  estradiol  levels are also being monitored as part of his hormone management which concerns him. He states he feels really good and feels he is in his thirties. He does note some irritability per his wife.  He was previously on Wellbutrin , which he took as needed, but has reduced its use due to side effects such as queasiness and decreased effectiveness. Wellbutrin  was initially prescribed after a nervous breakdown in 2015, but he feels he no longer needs it. He takes this intermittently. His energy is significantly higher than his normal level today.          PHYSICAL EXAM:  Blood pressure 119/72, pulse 79, temperature 98 F (36.7 C), height 6' 2 (1.88 m), weight (!) 301 lb (136.5 kg), SpO2 97%. Body mass index is 38.65 kg/m.  DIAGNOSTIC DATA REVIEWED:  BMET    Component Value Date/Time   NA 139 04/30/2023 0741   K 4.8 04/30/2023 0741   CL 102 04/30/2023 0741   CO2 25 04/30/2023 0741   GLUCOSE 111 (H) 04/30/2023 0741   GLUCOSE 146 (H) 07/09/2016 1823   BUN 17 04/30/2023 0741   CREATININE 1.01 04/30/2023 0741   CALCIUM  9.1 04/30/2023 0741   GFRNONAA 75 12/08/2020 1402   GFRAA 87 12/08/2020 1402   Lab Results  Component Value Date   HGBA1C 5.6 04/30/2023   HGBA1C 5.8 (H) 06/08/2016   Lab Results  Component Value Date   INSULIN  14.6 04/30/2023   INSULIN  33.8 (H)  03/24/2020   Lab Results  Component Value Date   TSH 3.240 03/24/2020   CBC    Component Value Date/Time   WBC 7.4 03/24/2020 1234   WBC 8.9 07/09/2016 1823   RBC 4.56 03/24/2020 1234   RBC 4.51 07/09/2016 1823   HGB 13.4 03/24/2020 1234   HCT 41.1 03/24/2020 1234   PLT 210 03/24/2020 1234   MCV 90 03/24/2020 1234   MCH 29.4 03/24/2020 1234   MCH 29.3 07/09/2016 1823   MCHC 32.6 03/24/2020 1234   MCHC 32.1 07/09/2016 1823   RDW 13.0 03/24/2020 1234   Iron Studies No results found for: IRON, TIBC, FERRITIN, IRONPCTSAT Lipid Panel     Component Value Date/Time   CHOL 211 (H) 04/30/2023  0741   TRIG 81 04/30/2023 0741   HDL 63 04/30/2023 0741   CHOLHDL 4.5 12/08/2020 1402   LDLCALC 134 (H) 04/30/2023 0741   Hepatic Function Panel     Component Value Date/Time   PROT 6.6 04/30/2023 0741   ALBUMIN 4.2 04/30/2023 0741   AST 25 04/30/2023 0741   ALT 30 04/30/2023 0741   ALKPHOS 62 04/30/2023 0741   BILITOT 0.6 04/30/2023 0741      Component Value Date/Time   TSH 3.240 03/24/2020 1234   Nutritional Lab Results  Component Value Date   VD25OH 33.1 04/30/2023   VD25OH 44.6 01/08/2023   VD25OH 35.5 07/30/2022     Assessment and Plan    Obesity Obesity with recent weight loss of three pounds over the last three months. Loosely adhering to a category three and four eating plan about fifty percent of the time but mostly doing portion control and trying to make smarter choices. he is engaging in regular physical activity including yard work and walking his dog. Discussed the benefits of weight loss on overall health and the importance of maintaining current lifestyle changes. - Continue current diet and exercise regimen - Encourage portion control and protein intake - Maintain hydration  Type 2 Diabetes Mellitus Type 2 Diabetes Mellitus well controlled with a hemoglobin A1c of 5.6. On Ozempic  2 mg weekly, working on diet, exercise, and weight loss to improve blood sugar levels. Discussed the benefits of Ozempic  in controlling blood sugar and aiding weight loss, and the importance of adherence to the medication regimen. - Continue Ozempic  2 mg weekly - Monitor blood sugar levels regularly - Encourage continued diet and exercise  Testosterone  Deficiency Testosterone  deficiency managed with testosterone  injections since 2021. Current levels are well maintained. Reports increased activity and overall well-being since starting therapy. Discussed the importance of regular monitoring of testosterone  levels and potential side effects such as increased estradiol  levels.  -  Continue testosterone  injections as prescribed - Monitor testosterone  levels regularly with urology   Depression Being monitored by Dr Arloa.   -Advised to either take Wellbutrin  or taper off, as taking it intermittently is not usually helpful. -Continue Trintellix  as prescribed  General Health Maintenance General health maintenance discussed including the importance of hydration, especially with increased activity levels. On vitamin D  supplementation and uses a CPAP machine. Discussed the importance of regular use of CPAP for managing sleep apnea and the benefits of vitamin D  supplementation. - Continue vitamin D  supplementation - Continue CPAP use - Encourage regular hydration  Follow-up - Schedule follow-up appointment in two months.        He was informed of the importance of frequent follow up visits to maximize his success with intensive lifestyle modifications for his multiple health  conditions.    Louann Penton, MD

## 2023-12-13 ENCOUNTER — Other Ambulatory Visit (HOSPITAL_BASED_OUTPATIENT_CLINIC_OR_DEPARTMENT_OTHER): Payer: Self-pay

## 2023-12-19 ENCOUNTER — Other Ambulatory Visit (HOSPITAL_BASED_OUTPATIENT_CLINIC_OR_DEPARTMENT_OTHER): Payer: Self-pay

## 2023-12-20 ENCOUNTER — Other Ambulatory Visit (HOSPITAL_BASED_OUTPATIENT_CLINIC_OR_DEPARTMENT_OTHER): Payer: Self-pay

## 2023-12-26 ENCOUNTER — Other Ambulatory Visit (HOSPITAL_BASED_OUTPATIENT_CLINIC_OR_DEPARTMENT_OTHER): Payer: Self-pay

## 2023-12-26 DIAGNOSIS — R051 Acute cough: Secondary | ICD-10-CM | POA: Diagnosis not present

## 2023-12-26 DIAGNOSIS — Z03818 Encounter for observation for suspected exposure to other biological agents ruled out: Secondary | ICD-10-CM | POA: Diagnosis not present

## 2023-12-26 DIAGNOSIS — J208 Acute bronchitis due to other specified organisms: Secondary | ICD-10-CM | POA: Diagnosis not present

## 2023-12-26 MED ORDER — PREDNISONE 20 MG PO TABS
40.0000 mg | ORAL_TABLET | Freq: Every day | ORAL | 0 refills | Status: AC
  Filled 2023-12-26: qty 10, 5d supply, fill #0

## 2023-12-26 MED ORDER — BENZONATATE 200 MG PO CAPS
200.0000 mg | ORAL_CAPSULE | Freq: Three times a day (TID) | ORAL | 0 refills | Status: DC
  Filled 2023-12-26: qty 21, 7d supply, fill #0

## 2023-12-27 ENCOUNTER — Other Ambulatory Visit (HOSPITAL_BASED_OUTPATIENT_CLINIC_OR_DEPARTMENT_OTHER): Payer: Self-pay

## 2023-12-28 ENCOUNTER — Other Ambulatory Visit (HOSPITAL_BASED_OUTPATIENT_CLINIC_OR_DEPARTMENT_OTHER): Payer: Self-pay

## 2023-12-28 DIAGNOSIS — J209 Acute bronchitis, unspecified: Secondary | ICD-10-CM | POA: Diagnosis not present

## 2023-12-30 ENCOUNTER — Other Ambulatory Visit (HOSPITAL_BASED_OUTPATIENT_CLINIC_OR_DEPARTMENT_OTHER): Payer: Self-pay

## 2023-12-30 MED ORDER — PROMETHAZINE-DM 6.25-15 MG/5ML PO SYRP
5.0000 mL | ORAL_SOLUTION | Freq: Four times a day (QID) | ORAL | 0 refills | Status: DC | PRN
  Filled 2023-12-30: qty 100, 5d supply, fill #0

## 2023-12-30 MED ORDER — ALBUTEROL SULFATE HFA 108 (90 BASE) MCG/ACT IN AERS
1.0000 | INHALATION_SPRAY | RESPIRATORY_TRACT | 0 refills | Status: AC | PRN
  Filled 2023-12-30: qty 8.5, 30d supply, fill #0

## 2024-01-02 ENCOUNTER — Other Ambulatory Visit (HOSPITAL_BASED_OUTPATIENT_CLINIC_OR_DEPARTMENT_OTHER): Payer: Self-pay

## 2024-01-02 DIAGNOSIS — R053 Chronic cough: Secondary | ICD-10-CM | POA: Diagnosis not present

## 2024-01-02 DIAGNOSIS — J40 Bronchitis, not specified as acute or chronic: Secondary | ICD-10-CM | POA: Diagnosis not present

## 2024-01-02 MED ORDER — AZITHROMYCIN 250 MG PO TABS
ORAL_TABLET | ORAL | 0 refills | Status: DC
  Filled 2024-01-02: qty 6, 5d supply, fill #0

## 2024-01-02 MED ORDER — BUDESONIDE 0.5 MG/2ML IN SUSP
RESPIRATORY_TRACT | 0 refills | Status: DC
  Filled 2024-01-02: qty 60, 15d supply, fill #0

## 2024-01-02 MED ORDER — HYDROCOD POLI-CHLORPHE POLI ER 10-8 MG/5ML PO SUER
5.0000 mL | Freq: Two times a day (BID) | ORAL | 0 refills | Status: DC
Start: 2024-01-02 — End: 2024-08-20
  Filled 2024-01-02: qty 100, 10d supply, fill #0

## 2024-01-15 ENCOUNTER — Other Ambulatory Visit (HOSPITAL_BASED_OUTPATIENT_CLINIC_OR_DEPARTMENT_OTHER): Payer: Self-pay

## 2024-01-15 MED ORDER — TADALAFIL 20 MG PO TABS
20.0000 mg | ORAL_TABLET | Freq: Every day | ORAL | 3 refills | Status: AC | PRN
  Filled 2024-01-15: qty 15, 15d supply, fill #0
  Filled 2024-02-20: qty 15, 15d supply, fill #1
  Filled 2024-04-13: qty 15, 30d supply, fill #2
  Filled 2024-05-08: qty 15, 30d supply, fill #3

## 2024-01-17 ENCOUNTER — Other Ambulatory Visit (HOSPITAL_BASED_OUTPATIENT_CLINIC_OR_DEPARTMENT_OTHER): Payer: Self-pay

## 2024-01-17 DIAGNOSIS — U071 COVID-19: Secondary | ICD-10-CM | POA: Diagnosis not present

## 2024-01-17 MED ORDER — LAGEVRIO 200 MG PO CAPS
4.0000 | ORAL_CAPSULE | Freq: Two times a day (BID) | ORAL | 0 refills | Status: AC
Start: 2024-01-17 — End: 2024-01-22
  Filled 2024-01-17: qty 40, 5d supply, fill #0

## 2024-01-21 ENCOUNTER — Other Ambulatory Visit (HOSPITAL_BASED_OUTPATIENT_CLINIC_OR_DEPARTMENT_OTHER): Payer: Self-pay

## 2024-01-24 ENCOUNTER — Other Ambulatory Visit: Payer: Self-pay

## 2024-01-27 DIAGNOSIS — E291 Testicular hypofunction: Secondary | ICD-10-CM | POA: Diagnosis not present

## 2024-01-27 DIAGNOSIS — Z125 Encounter for screening for malignant neoplasm of prostate: Secondary | ICD-10-CM | POA: Diagnosis not present

## 2024-01-27 LAB — PSA: PSA: 6.59

## 2024-01-27 LAB — CBC AND DIFFERENTIAL
HCT: 49 (ref 41–53)
Hemoglobin: 16 (ref 13.5–17.5)

## 2024-02-05 ENCOUNTER — Telehealth (INDEPENDENT_AMBULATORY_CARE_PROVIDER_SITE_OTHER): Payer: Self-pay | Admitting: *Deleted

## 2024-02-05 ENCOUNTER — Ambulatory Visit (INDEPENDENT_AMBULATORY_CARE_PROVIDER_SITE_OTHER): Payer: BC Managed Care – PPO | Admitting: Family Medicine

## 2024-02-05 ENCOUNTER — Other Ambulatory Visit (HOSPITAL_BASED_OUTPATIENT_CLINIC_OR_DEPARTMENT_OTHER): Payer: Self-pay

## 2024-02-05 ENCOUNTER — Encounter (INDEPENDENT_AMBULATORY_CARE_PROVIDER_SITE_OTHER): Payer: Self-pay | Admitting: Family Medicine

## 2024-02-05 VITALS — BP 117/67 | HR 68 | Temp 98.2°F | Ht 74.0 in | Wt 313.0 lb

## 2024-02-05 DIAGNOSIS — E559 Vitamin D deficiency, unspecified: Secondary | ICD-10-CM | POA: Diagnosis not present

## 2024-02-05 DIAGNOSIS — E119 Type 2 diabetes mellitus without complications: Secondary | ICD-10-CM | POA: Diagnosis not present

## 2024-02-05 DIAGNOSIS — Z7985 Long-term (current) use of injectable non-insulin antidiabetic drugs: Secondary | ICD-10-CM

## 2024-02-05 DIAGNOSIS — L739 Follicular disorder, unspecified: Secondary | ICD-10-CM

## 2024-02-05 DIAGNOSIS — R609 Edema, unspecified: Secondary | ICD-10-CM | POA: Diagnosis not present

## 2024-02-05 DIAGNOSIS — E785 Hyperlipidemia, unspecified: Secondary | ICD-10-CM

## 2024-02-05 DIAGNOSIS — E1169 Type 2 diabetes mellitus with other specified complication: Secondary | ICD-10-CM

## 2024-02-05 DIAGNOSIS — E669 Obesity, unspecified: Secondary | ICD-10-CM

## 2024-02-05 DIAGNOSIS — F3289 Other specified depressive episodes: Secondary | ICD-10-CM

## 2024-02-05 DIAGNOSIS — Z6841 Body Mass Index (BMI) 40.0 and over, adult: Secondary | ICD-10-CM

## 2024-02-05 MED ORDER — TIRZEPATIDE 12.5 MG/0.5ML ~~LOC~~ SOAJ
12.5000 mg | SUBCUTANEOUS | 0 refills | Status: DC
  Filled 2024-02-05: qty 2, 28d supply, fill #0

## 2024-02-05 NOTE — Telephone Encounter (Signed)
 PA SUBMITTED VIA [employer] FOR MOUNJARO 12.5 MG  Dyke Maes (Key: [id number])  Your information has been sent to [facility].

## 2024-02-05 NOTE — Progress Notes (Signed)
 Office: [phone]  /  Fax: [phone]  WEIGHT SUMMARY AND BIOMETRICS  Anthropometric Measurements Height: 6\' 2"  (1.88 m) Weight: (!) 313 lb (142 kg) BMI (Calculated): 40.17 Weight at Last Visit: 301 lb Weight Lost Since Last Visit: 0 Weight Gained Since Last Visit: 12 lb Starting Weight: 342 lb Total Weight Loss (lbs): 12 lb (5.443 kg)   Body Composition  Body Fat %: 38.9 % Fat Mass (lbs): 121.8 lbs Muscle Mass (lbs): 181.8 lbs Total Body Water (lbs): 136.8 lbs Visceral Fat Rating : 24   Other Clinical Data Fasting: yes Labs: no Today's Visit #: no Starting Date: 03/24/20    Chief Complaint: OBESITY    History of Present Illness Luis Russell is a 58 year old male who presents to discuss his obesity treatment and assess his progress. He is accompanied by his wife.  He recently experienced COVID-19 and a concurrent bronchitis infection, which required multiple visits for treatment. He was not hospitalized but experienced significant fatigue and required albuterol nebulizer treatments, which were effective. He felt 'really drugged up' for about a month and a half during his illness and missed work due to these conditions.  He has gained twelve pounds in the last two months, with eight pounds attributed to fluid retention. He attributes some of the weight gain to changes in his diet and fluid intake during his illness, including increased consumption of regular Gatorade and diet [facility]. He has been exercising for about fifteen minutes three to four times a week but has not been able to maintain his category three eating plan consistently.  He is currently on Ozempic at a two milligram dose weekly but feels it is not as effective as before. He has a history of using other medications like Victoza. He also reports a high testosterone level, which he is monitoring with his urologist. He has not checked his blood sugars recently, which is a concern given his  use of steroids.  He has a place on the back of his head that appears to be folliculitis, for which he has been using Bactroban. The area is erythematous  and irritated with some folliculitis in a 5 cm strip over his occipital region.  He experienced fatigue and a lack of energy, which he attributes to his recent illness. He has been trying to follow a structured eating plan and exercise routine but has faced challenges due to recent illness.      PHYSICAL EXAM:  Blood pressure 117/67, pulse 68, temperature 98.2 F (36.8 C), height 6\' 2"  (1.88 m), weight (!) 313 lb (142 kg), SpO2 95%. Body mass index is 40.19 kg/m.  DIAGNOSTIC DATA REVIEWED:  BMET    Component Value Date/Time   NA 139 04/30/2023 0741   K 4.8 04/30/2023 0741   CL 102 04/30/2023 0741   CO2 25 04/30/2023 0741   GLUCOSE 111 (H) 04/30/2023 0741   GLUCOSE 146 (H) 07/09/2016 1823   BUN 17 04/30/2023 0741   CREATININE 1.01 04/30/2023 0741   CALCIUM 9.1 04/30/2023 0741   GFRNONAA 75 12/08/2020 1402   GFRAA 87 12/08/2020 1402   Lab Results  Component Value Date   HGBA1C 5.6 04/30/2023   HGBA1C 5.8 (H) 06/08/2016   Lab Results  Component Value Date   INSULIN 14.6 04/30/2023   INSULIN 33.8 (H) 03/24/2020   Lab Results  Component Value Date   TSH 3.240 03/24/2020   CBC    Component Value Date/Time   WBC 7.4 03/24/2020 1234  WBC 8.9 07/09/2016 1823   RBC 4.56 03/24/2020 1234   RBC 4.51 07/09/2016 1823   HGB 13.4 03/24/2020 1234   HCT 41.1 03/24/2020 1234   PLT 210 03/24/2020 1234   MCV 90 03/24/2020 1234   MCH 29.4 03/24/2020 1234   MCH 29.3 07/09/2016 1823   MCHC 32.6 03/24/2020 1234   MCHC 32.1 07/09/2016 1823   RDW 13.0 03/24/2020 1234   Iron Studies No results found for: "IRON", "TIBC", "FERRITIN", "IRONPCTSAT" Lipid Panel     Component Value Date/Time   CHOL 211 (H) 04/30/2023 0741   TRIG 81 04/30/2023 0741   HDL 63 04/30/2023 0741   CHOLHDL 4.5 12/08/2020 1402   LDLCALC 134 (H)  04/30/2023 0741   Hepatic Function Panel     Component Value Date/Time   PROT 6.6 04/30/2023 0741   ALBUMIN 4.2 04/30/2023 0741   AST 25 04/30/2023 0741   ALT 30 04/30/2023 0741   ALKPHOS 62 04/30/2023 0741   BILITOT 0.6 04/30/2023 0741      Component Value Date/Time   TSH 3.240 03/24/2020 1234   Nutritional Lab Results  Component Value Date   VD25OH 33.1 04/30/2023   VD25OH 44.6 01/08/2023   VD25OH 35.5 07/30/2022     Assessment and Plan Assessment & Plan COVID-19 and Bronchitis Recent COVID-19 infection and bronchitis have led to significant illness and fatigue. Hospitalization was not required, but multiple visits for medication adjustments were necessary. Albuterol nebulizer effectively managed symptoms. Current symptoms include fatigue and lack of energy, likely exacerbated by recent illness and ongoing recovery. - Avoid exposure to sick individuals to prevent further respiratory infections.  Weight Gain and Fluid Retention Gained 12 pounds in the last two months, with 8 pounds attributed to fluid retention likely due to inflammation from infection and prednisone use. Weight gain is a combination of fluid and real weight. Dietary habits during illness included increased intake of regular Gatorade and diet sodas, contributing to weight gain. Reduced activity due to illness may also contribute. - Encourage adherence to a structured eating plan, specifically the category four eating plan. - Monitor weight and fluid status. - Consider dietary modifications to reduce fluid retention.  Obesity Management and DM II Currently on Ozempic 2 mg for weight management, but the medication seems less effective. Discussed switching to [facility], which includes GLP-1 and GIP, potentially offering better control over cravings and weight management. Weight loss surgery was discussed as an option, but he is not currently interested. Greggory Keen is expected to help with cravings due to its GIP  component, and it takes four doses to achieve full effect. - Switch from Ozempic to [facility] at 12.5 mg dose. - Monitor response to [facility] over the next month. - Reinforce the importance of dietary adherence and lifestyle modifications. -Check labs  Hyperglycemia Risk Potential hyperglycemia due to recent prednisone use, which can increase blood sugar levels. Blood sugar levels have not been checked recently, which is important given the recent steroid use. Steroids can increase insulin resistance and fat storage, exacerbating hyperglycemia risk. - Monitor blood sugar levels, especially after prednisone use.  Folliculitis Folliculitis at the hairline, likely due to mask friction. Redness and irritation noted but no fluctuance. No signs of infection or abscess formation. Bactroban ointment is being used,  - Continue using Bactroban ointment. - He will need a more formal evaluation if this continues to worsen or fails to improve  HLD- working on diet, due for labs -Check labs - Continue diet, exercise and weight loss  Vit D deficiency Due for labs -check labs and follow   Follow-up Follow-up is important to assess the effectiveness of the new medication and overall health status. - Schedule follow-up appointment in four weeks to evaluate response to [facility] and overall health status.   He was informed of the importance of frequent follow up visits to maximize his success with intensive lifestyle modifications for his multiple health conditions.    [name], MD

## 2024-02-05 NOTE — Telephone Encounter (Signed)
 Approved, patient notified via Mychart.  Message from Plan Request Reference Number: [id number].  MOUNJARO INJ 12.5/0.5 is approved through 02/04/2025.  Your patient may now fill this prescription and it will be covered..  Authorization Expiration Date: February 04, 2025.

## 2024-02-06 DIAGNOSIS — F331 Major depressive disorder, recurrent, moderate: Secondary | ICD-10-CM | POA: Diagnosis not present

## 2024-02-06 DIAGNOSIS — F4310 Post-traumatic stress disorder, unspecified: Secondary | ICD-10-CM | POA: Diagnosis not present

## 2024-02-06 LAB — LIPID PANEL WITH LDL/HDL RATIO

## 2024-02-07 LAB — CMP14+EGFR
ALT: 29 IU/L (ref 0–44)
AST: 31 IU/L (ref 0–40)
Albumin: 4.2 g/dL (ref 3.8–4.9)
Alkaline Phosphatase: 57 IU/L (ref 44–121)
BUN/Creatinine Ratio: 14 (ref 10–24)
BUN: 14 mg/dL (ref 8–27)
Bilirubin Total: 0.7 mg/dL (ref 0.0–1.2)
CO2: 20 mmol/L (ref 20–29)
Calcium: 8.8 mg/dL (ref 8.6–10.2)
Chloride: 104 mmol/L (ref 96–106)
Creatinine, Ser: 1.02 mg/dL (ref 0.76–1.27)
Globulin, Total: 2.3 g/dL (ref 1.5–4.5)
Glucose: 88 mg/dL (ref 70–99)
Potassium: 4.3 mmol/L (ref 3.5–5.2)
Sodium: 140 mmol/L (ref 134–144)
Total Protein: 6.5 g/dL (ref 6.0–8.5)
eGFR: 84 mL/min/{1.73_m2} (ref >59)

## 2024-02-07 LAB — VITAMIN B12: Vitamin B-12: 434 pg/mL (ref 232–1245)

## 2024-02-07 LAB — LIPID PANEL WITH LDL/HDL RATIO
Cholesterol, Total: 212 mg/dL — ABNORMAL HIGH (ref 100–199)
HDL: 63 mg/dL (ref >39)
LDL Chol Calc (NIH): 136 mg/dL — ABNORMAL HIGH (ref 0–99)
LDL/HDL Ratio: 2.2 ratio (ref 0.0–3.6)
Triglycerides: 74 mg/dL (ref 0–149)
VLDL Cholesterol Cal: 13 mg/dL (ref 5–40)

## 2024-02-07 LAB — VITAMIN D 25 HYDROXY (VIT D DEFICIENCY, FRACTURES): Vit D, 25-Hydroxy: 24.1 ng/mL — ABNORMAL LOW (ref 30.0–100.0)

## 2024-02-07 LAB — TSH: TSH: 3.46 u[IU]/mL (ref 0.450–4.500)

## 2024-02-07 LAB — INSULIN, RANDOM: INSULIN: 12.2 u[IU]/mL (ref 2.6–24.9)

## 2024-02-07 LAB — HEMOGLOBIN A1C
Est. average glucose Bld gHb Est-mCnc: 111 mg/dL
Hgb A1c MFr Bld: 5.5 % (ref 4.8–5.6)

## 2024-02-10 ENCOUNTER — Encounter (INDEPENDENT_AMBULATORY_CARE_PROVIDER_SITE_OTHER): Payer: Self-pay | Admitting: Family Medicine

## 2024-02-10 DIAGNOSIS — E349 Endocrine disorder, unspecified: Secondary | ICD-10-CM

## 2024-02-11 DIAGNOSIS — F1020 Alcohol dependence, uncomplicated: Secondary | ICD-10-CM | POA: Diagnosis not present

## 2024-02-11 DIAGNOSIS — F4310 Post-traumatic stress disorder, unspecified: Secondary | ICD-10-CM | POA: Diagnosis not present

## 2024-02-11 DIAGNOSIS — F332 Major depressive disorder, recurrent severe without psychotic features: Secondary | ICD-10-CM | POA: Diagnosis not present

## 2024-02-13 ENCOUNTER — Encounter (INDEPENDENT_AMBULATORY_CARE_PROVIDER_SITE_OTHER): Payer: Self-pay

## 2024-02-19 ENCOUNTER — Other Ambulatory Visit: Payer: Self-pay

## 2024-02-19 DIAGNOSIS — F1020 Alcohol dependence, uncomplicated: Secondary | ICD-10-CM | POA: Diagnosis not present

## 2024-02-19 DIAGNOSIS — F4310 Post-traumatic stress disorder, unspecified: Secondary | ICD-10-CM | POA: Diagnosis not present

## 2024-02-19 DIAGNOSIS — F331 Major depressive disorder, recurrent, moderate: Secondary | ICD-10-CM | POA: Diagnosis not present

## 2024-02-20 ENCOUNTER — Other Ambulatory Visit: Payer: Self-pay

## 2024-02-20 ENCOUNTER — Other Ambulatory Visit (HOSPITAL_BASED_OUTPATIENT_CLINIC_OR_DEPARTMENT_OTHER): Payer: Self-pay

## 2024-02-20 DIAGNOSIS — F331 Major depressive disorder, recurrent, moderate: Secondary | ICD-10-CM | POA: Diagnosis not present

## 2024-02-20 DIAGNOSIS — F1020 Alcohol dependence, uncomplicated: Secondary | ICD-10-CM | POA: Diagnosis not present

## 2024-02-20 DIAGNOSIS — F4310 Post-traumatic stress disorder, unspecified: Secondary | ICD-10-CM | POA: Diagnosis not present

## 2024-02-20 MED ORDER — HYDROXYZINE PAMOATE 25 MG PO CAPS
25.0000 mg | ORAL_CAPSULE | Freq: Two times a day (BID) | ORAL | 0 refills | Status: DC | PRN
  Filled 2024-02-20: qty 60, 30d supply, fill #0
  Filled 2024-04-13: qty 60, 30d supply, fill #1

## 2024-02-20 MED ORDER — TRINTELLIX 10 MG PO TABS
10.0000 mg | ORAL_TABLET | Freq: Every day | ORAL | 0 refills | Status: DC
  Filled 2024-02-20: qty 30, 30d supply, fill #0
  Filled 2024-04-13: qty 30, 30d supply, fill #1

## 2024-02-20 MED ORDER — BUPROPION HCL ER (XL) 300 MG PO TB24
300.0000 mg | ORAL_TABLET | Freq: Every morning | ORAL | 0 refills | Status: DC
  Filled 2024-02-20: qty 30, 30d supply, fill #0
  Filled 2024-04-13: qty 30, 30d supply, fill #1

## 2024-03-02 DIAGNOSIS — F1020 Alcohol dependence, uncomplicated: Secondary | ICD-10-CM | POA: Diagnosis not present

## 2024-03-02 DIAGNOSIS — F332 Major depressive disorder, recurrent severe without psychotic features: Secondary | ICD-10-CM | POA: Diagnosis not present

## 2024-03-02 DIAGNOSIS — F4310 Post-traumatic stress disorder, unspecified: Secondary | ICD-10-CM | POA: Diagnosis not present

## 2024-03-04 ENCOUNTER — Other Ambulatory Visit (INDEPENDENT_AMBULATORY_CARE_PROVIDER_SITE_OTHER): Payer: Self-pay | Admitting: Family Medicine

## 2024-03-04 ENCOUNTER — Encounter (INDEPENDENT_AMBULATORY_CARE_PROVIDER_SITE_OTHER): Payer: Self-pay | Admitting: Family Medicine

## 2024-03-04 ENCOUNTER — Other Ambulatory Visit (HOSPITAL_BASED_OUTPATIENT_CLINIC_OR_DEPARTMENT_OTHER): Payer: Self-pay

## 2024-03-04 DIAGNOSIS — E1169 Type 2 diabetes mellitus with other specified complication: Secondary | ICD-10-CM

## 2024-03-06 ENCOUNTER — Other Ambulatory Visit (HOSPITAL_BASED_OUTPATIENT_CLINIC_OR_DEPARTMENT_OTHER): Payer: Self-pay

## 2024-03-06 ENCOUNTER — Other Ambulatory Visit (INDEPENDENT_AMBULATORY_CARE_PROVIDER_SITE_OTHER): Payer: Self-pay | Admitting: Family Medicine

## 2024-03-06 ENCOUNTER — Encounter (HOSPITAL_BASED_OUTPATIENT_CLINIC_OR_DEPARTMENT_OTHER): Payer: Self-pay

## 2024-03-06 ENCOUNTER — Encounter (INDEPENDENT_AMBULATORY_CARE_PROVIDER_SITE_OTHER): Payer: Self-pay | Admitting: Family Medicine

## 2024-03-06 DIAGNOSIS — E1169 Type 2 diabetes mellitus with other specified complication: Secondary | ICD-10-CM

## 2024-03-09 ENCOUNTER — Other Ambulatory Visit (HOSPITAL_BASED_OUTPATIENT_CLINIC_OR_DEPARTMENT_OTHER): Payer: Self-pay

## 2024-03-10 ENCOUNTER — Other Ambulatory Visit (HOSPITAL_BASED_OUTPATIENT_CLINIC_OR_DEPARTMENT_OTHER): Payer: Self-pay

## 2024-03-10 ENCOUNTER — Encounter (INDEPENDENT_AMBULATORY_CARE_PROVIDER_SITE_OTHER): Payer: Self-pay | Admitting: Family Medicine

## 2024-03-10 NOTE — Telephone Encounter (Signed)
 LAST APPOINTMENT DATE: 02/05/2024 NEXT APPOINTMENT DATE: 03/19/2024   [facility] [address] [place] [address] Phone: [phone] Fax: [phone]  Patient is requesting a refill of the following medications: Requested Prescriptions   Pending Prescriptions Disp Refills   tirzepatide  (MOUNJARO ) 12.5 MG/0.5ML Pen 2 mL 0    Sig: Inject 12.5 mg into the skin once a week.    Date last filled: 02/05/2024 Previously prescribed by Dr Alvia Awkward  Lab Results  Component Value Date   HGBA1C 5.5 02/05/2024   HGBA1C 5.6 04/30/2023   HGBA1C 5.3 01/08/2023   Lab Results  Component Value Date   LDLCALC 136 (H) 02/05/2024   CREATININE 1.02 02/05/2024   Lab Results  Component Value Date   VD25OH 24.1 (L) 02/05/2024   VD25OH 33.1 04/30/2023   VD25OH 44.6 01/08/2023    BP Readings from Last 3 Encounters:  02/05/24 117/67  12/04/23 119/72  09/17/23 120/68

## 2024-03-10 NOTE — Telephone Encounter (Signed)
 Dr Alvia Awkward, please advise on this message.  Patient states that he is using Ozempic .

## 2024-03-11 ENCOUNTER — Other Ambulatory Visit (HOSPITAL_BASED_OUTPATIENT_CLINIC_OR_DEPARTMENT_OTHER): Payer: Self-pay

## 2024-03-11 ENCOUNTER — Other Ambulatory Visit (INDEPENDENT_AMBULATORY_CARE_PROVIDER_SITE_OTHER): Payer: Self-pay | Admitting: Family Medicine

## 2024-03-11 MED ORDER — TIRZEPATIDE 12.5 MG/0.5ML ~~LOC~~ SOAJ
12.5000 mg | SUBCUTANEOUS | 0 refills | Status: DC
  Filled 2024-03-11: qty 2, 28d supply, fill #0

## 2024-03-19 ENCOUNTER — Encounter (INDEPENDENT_AMBULATORY_CARE_PROVIDER_SITE_OTHER): Payer: Self-pay

## 2024-03-19 ENCOUNTER — Ambulatory Visit (INDEPENDENT_AMBULATORY_CARE_PROVIDER_SITE_OTHER): Admitting: Family Medicine

## 2024-03-20 ENCOUNTER — Other Ambulatory Visit: Payer: Self-pay

## 2024-03-25 ENCOUNTER — Ambulatory Visit: Payer: Self-pay | Admitting: Urology

## 2024-03-30 DIAGNOSIS — F332 Major depressive disorder, recurrent severe without psychotic features: Secondary | ICD-10-CM | POA: Diagnosis not present

## 2024-03-30 DIAGNOSIS — F1020 Alcohol dependence, uncomplicated: Secondary | ICD-10-CM | POA: Diagnosis not present

## 2024-03-30 DIAGNOSIS — F4310 Post-traumatic stress disorder, unspecified: Secondary | ICD-10-CM | POA: Diagnosis not present

## 2024-04-13 ENCOUNTER — Other Ambulatory Visit: Payer: Self-pay

## 2024-04-13 ENCOUNTER — Other Ambulatory Visit (INDEPENDENT_AMBULATORY_CARE_PROVIDER_SITE_OTHER): Payer: Self-pay | Admitting: Family Medicine

## 2024-04-13 ENCOUNTER — Other Ambulatory Visit (HOSPITAL_BASED_OUTPATIENT_CLINIC_OR_DEPARTMENT_OTHER): Payer: Self-pay

## 2024-04-13 ENCOUNTER — Encounter (HOSPITAL_BASED_OUTPATIENT_CLINIC_OR_DEPARTMENT_OTHER): Payer: Self-pay

## 2024-04-13 DIAGNOSIS — E559 Vitamin D deficiency, unspecified: Secondary | ICD-10-CM

## 2024-04-13 DIAGNOSIS — E1169 Type 2 diabetes mellitus with other specified complication: Secondary | ICD-10-CM

## 2024-04-14 ENCOUNTER — Encounter (INDEPENDENT_AMBULATORY_CARE_PROVIDER_SITE_OTHER): Payer: Self-pay | Admitting: Family Medicine

## 2024-04-14 DIAGNOSIS — F4310 Post-traumatic stress disorder, unspecified: Secondary | ICD-10-CM | POA: Diagnosis not present

## 2024-04-14 DIAGNOSIS — F1020 Alcohol dependence, uncomplicated: Secondary | ICD-10-CM | POA: Diagnosis not present

## 2024-04-14 DIAGNOSIS — F331 Major depressive disorder, recurrent, moderate: Secondary | ICD-10-CM | POA: Diagnosis not present

## 2024-04-15 ENCOUNTER — Ambulatory Visit (INDEPENDENT_AMBULATORY_CARE_PROVIDER_SITE_OTHER): Admitting: Family Medicine

## 2024-04-15 ENCOUNTER — Other Ambulatory Visit (HOSPITAL_BASED_OUTPATIENT_CLINIC_OR_DEPARTMENT_OTHER): Payer: Self-pay

## 2024-04-15 ENCOUNTER — Encounter (INDEPENDENT_AMBULATORY_CARE_PROVIDER_SITE_OTHER): Payer: Self-pay | Admitting: Family Medicine

## 2024-04-15 VITALS — BP 111/68 | HR 69 | Temp 98.0°F | Ht 74.0 in | Wt 298.0 lb

## 2024-04-15 DIAGNOSIS — E669 Obesity, unspecified: Secondary | ICD-10-CM

## 2024-04-15 DIAGNOSIS — E119 Type 2 diabetes mellitus without complications: Secondary | ICD-10-CM | POA: Diagnosis not present

## 2024-04-15 DIAGNOSIS — G4709 Other insomnia: Secondary | ICD-10-CM

## 2024-04-15 DIAGNOSIS — Z7985 Long-term (current) use of injectable non-insulin antidiabetic drugs: Secondary | ICD-10-CM

## 2024-04-15 DIAGNOSIS — E66813 Obesity, class 3: Secondary | ICD-10-CM

## 2024-04-15 DIAGNOSIS — Z6838 Body mass index (BMI) 38.0-38.9, adult: Secondary | ICD-10-CM | POA: Diagnosis not present

## 2024-04-15 DIAGNOSIS — E559 Vitamin D deficiency, unspecified: Secondary | ICD-10-CM | POA: Diagnosis not present

## 2024-04-15 DIAGNOSIS — E1169 Type 2 diabetes mellitus with other specified complication: Secondary | ICD-10-CM

## 2024-04-15 MED ORDER — TIRZEPATIDE 12.5 MG/0.5ML ~~LOC~~ SOAJ
12.5000 mg | SUBCUTANEOUS | 0 refills | Status: DC
  Filled 2024-04-15: qty 2, 28d supply, fill #0

## 2024-04-15 MED ORDER — VITAMIN D (ERGOCALCIFEROL) 1.25 MG (50000 UNIT) PO CAPS
50000.0000 [IU] | ORAL_CAPSULE | ORAL | 0 refills | Status: DC
Start: 2024-04-15 — End: 2024-06-23
  Filled 2024-04-15: qty 4, 28d supply, fill #0
  Filled 2024-05-08: qty 4, 28d supply, fill #1
  Filled 2024-06-11: qty 4, 28d supply, fill #2

## 2024-04-15 NOTE — Progress Notes (Unsigned)
 Office: [phone]  /  Fax: [phone]  WEIGHT SUMMARY AND BIOMETRICS  Anthropometric Measurements Height: 6' 2 (1.88 m) Weight: 298 lb (135.2 kg) BMI (Calculated): 38.24 Weight at Last Visit: 313 lb Weight Lost Since Last Visit: 15 lb Weight Gained Since Last Visit: 0 Starting Weight: 342 lb Total Weight Loss (lbs): 44 lb (20 kg) Peak Weight: 342 lb   Body Composition  Body Fat %: 37.5 % Fat Mass (lbs): 112 lbs Muscle Mass (lbs): 177.6 lbs Total Body Water (lbs): 129.2 lbs Visceral Fat Rating : 23   Other Clinical Data Fasting: yes Labs: no Today's Visit #: 41 Starting Date: 03/24/20    Chief Complaint: OBESITY    History of Present Illness Luis Russell is a 58 year old male with obesity and type 2 diabetes who presents for obesity treatment plan assessment and progress evaluation.  He follows the prescribed category three obesity treatment plan about fifty percent of the time. Physical activity has increased, with yard work for about an hour three days a week and walking his dog two to three times daily for ten minutes each time. He has lost fifteen pounds over the last two months since his last visit.  He has a history of type 2 diabetes and is currently being treated with Mounjaro  at a dose of 12.5 mg. No gastrointestinal issues with this medication, and he attributes his weight loss to its use. He requests a refill of Mounjaro .  He also has a history of vitamin D  deficiency and is on prescription vitamin D  at a dose of 50,000 international units weekly. He requests a refill for this medication as well.  He is on Wellbutrin  XL 300 mg daily which may help his emotional eating behaviors. This was last prescribed to him by Rex Castor, PA-C  and will be managed by them.  He mentions feeling tired this morning but denies any lightheadedness or dizziness. Changes in eating habits are noted due to the appetite-curbing effects of his medication, with smaller  portions and often having leftovers. He is adjusting to new portion sizes.  He is currently on testosterone  replacement      PHYSICAL EXAM:  Blood pressure 111/68, pulse 69, temperature 98 F (36.7 C), height 6' 2 (1.88 m), weight 298 lb (135.2 kg), SpO2 95%. Body mass index is 38.26 kg/m.  DIAGNOSTIC DATA REVIEWED:  BMET    Component Value Date/Time   NA 140 02/05/2024 1046   K 4.3 02/05/2024 1046   CL 104 02/05/2024 1046   CO2 20 02/05/2024 1046   GLUCOSE 88 02/05/2024 1046   GLUCOSE 146 (H) 07/09/2016 1823   BUN 14 02/05/2024 1046   CREATININE 1.02 02/05/2024 1046   CALCIUM  8.8 02/05/2024 1046   GFRNONAA 75 12/08/2020 1402   GFRAA 87 12/08/2020 1402   Lab Results  Component Value Date   HGBA1C 5.5 02/05/2024   HGBA1C 5.8 (H) 06/08/2016   Lab Results  Component Value Date   INSULIN  12.2 02/05/2024   INSULIN  33.8 (H) 03/24/2020   Lab Results  Component Value Date   TSH 3.460 02/05/2024   CBC    Component Value Date/Time   WBC 7.4 03/24/2020 1234   WBC 8.9 07/09/2016 1823   RBC 4.56 03/24/2020 1234   RBC 4.51 07/09/2016 1823   HGB 16.0 01/27/2024 0000   HGB 13.4 03/24/2020 1234   HCT 49 01/27/2024 0000   HCT 41.1 03/24/2020 1234   PLT 210 03/24/2020 1234   MCV 90 03/24/2020  1234   MCH 29.4 03/24/2020 1234   MCH 29.3 07/09/2016 1823   MCHC 32.6 03/24/2020 1234   MCHC 32.1 07/09/2016 1823   RDW 13.0 03/24/2020 1234   Iron Studies No results found for: IRON, TIBC, FERRITIN, IRONPCTSAT Lipid Panel     Component Value Date/Time   CHOL 212 (H) 02/05/2024 1046   TRIG 74 02/05/2024 1046   HDL 63 02/05/2024 1046   CHOLHDL 4.5 12/08/2020 1402   LDLCALC 136 (H) 02/05/2024 1046   Hepatic Function Panel     Component Value Date/Time   PROT 6.5 02/05/2024 1046   ALBUMIN 4.2 02/05/2024 1046   AST 31 02/05/2024 1046   ALT 29 02/05/2024 1046   ALKPHOS 57 02/05/2024 1046   BILITOT 0.7 02/05/2024 1046      Component Value Date/Time   TSH  3.460 02/05/2024 1046   Nutritional Lab Results  Component Value Date   VD25OH 24.1 (L) 02/05/2024   VD25OH 33.1 04/30/2023   VD25OH 44.6 01/08/2023     Assessment and Plan Assessment & Plan Type 2 Diabetes Mellitus Currently managed with Mounjaro  12.5 mg, resulting in effective blood glucose control and weight loss. No gastrointestinal side effects reported. - Refill Mounjaro  12.5 mg for 90 days - Recheck labs in two months - Continue working on diet, exercise and weight loss  Obesity Adheres to category three obesity treatment plan approximately 50% of the time. Increased physical activity, including yard work and walking, has contributed to a 15-pound weight loss over two months. Adjusting to reduced portion sizes due to appetite suppression from medication. Dietary advice reinforcement is necessary to sustain weight loss and metabolism. - Continue current obesity treatment plan - Encourage adherence to the treatment plan - Reinforce dietary advice: prioritize protein, then vegetables, and carbohydrates last - Encourage not skipping meals to maintain metabolism  Vitamin D  Deficiency Managed with prescription vitamin D  50,000 IU weekly. Requests prescription refill. - Refill vitamin D  50,000 IU weekly for 90 days - Recheck labs in two months     He was informed of the importance of frequent follow up visits to maximize his success with intensive lifestyle modifications for his multiple health conditions.    Jasmine Mesi, MD

## 2024-04-16 ENCOUNTER — Encounter (INDEPENDENT_AMBULATORY_CARE_PROVIDER_SITE_OTHER): Payer: Self-pay | Admitting: Family Medicine

## 2024-04-16 ENCOUNTER — Other Ambulatory Visit (HOSPITAL_BASED_OUTPATIENT_CLINIC_OR_DEPARTMENT_OTHER): Payer: Self-pay

## 2024-04-17 ENCOUNTER — Encounter (HOSPITAL_BASED_OUTPATIENT_CLINIC_OR_DEPARTMENT_OTHER): Payer: Self-pay

## 2024-04-17 ENCOUNTER — Other Ambulatory Visit (HOSPITAL_BASED_OUTPATIENT_CLINIC_OR_DEPARTMENT_OTHER): Payer: Self-pay

## 2024-04-17 DIAGNOSIS — J0100 Acute maxillary sinusitis, unspecified: Secondary | ICD-10-CM | POA: Diagnosis not present

## 2024-04-17 DIAGNOSIS — L0390 Cellulitis, unspecified: Secondary | ICD-10-CM | POA: Diagnosis not present

## 2024-04-17 MED ORDER — AZITHROMYCIN 250 MG PO TABS
ORAL_TABLET | ORAL | 0 refills | Status: AC
  Filled 2024-04-17: qty 6, 5d supply, fill #0

## 2024-04-21 NOTE — Progress Notes (Signed)
    Chief Complaint: No chief complaint on file.   History of Present Illness:  Luis Russell is a 58 y.o. male who is seen in consultation from Lesches Elsie SAUNDERS, MD for evaluation of low testosterone  level as well as elevated PSA.  Patient has previously been followed in [facility].  Currently on testosterone  cypionate 150 mg IM weekly.  He injects himself at home.     Past Medical History:  Past Medical History:  Diagnosis Date   Anxiety    Clotting disorder (HCC)    Constipation    Depression    Fatty liver    GERD (gastroesophageal reflux disease)    Hearing loss    Hypercholesterolemia     Past Surgical History:  Past Surgical History:  Procedure Laterality Date   CYST REMOVAL LEG Left 2001   INNER EAR SURGERY     yrs ago   TONSILLECTOMY     yrs ago    Allergies:  Allergies  Allergen Reactions   Amoxicillin Hives    Family History:  Family History  Problem Relation Age of Onset   Cancer Mother    Depression Mother    Heart attack Father    Alcoholism Father    Alcohol abuse Father    COPD Sister    Diabetes Brother    Heart disease Sister        pacemaker    Social History:  Social History   Tobacco Use   Smoking status: Never   Smokeless tobacco: Never  Substance Use Topics   Alcohol use: Yes    Comment: socially   Drug use: No    Review of symptoms:  Constitutional:  Negative for unexplained weight loss, night sweats, fever, chills ENT:  Negative for nose bleeds, sinus pain, painful swallowing CV:  Negative for chest pain, shortness of breath, exercise intolerance, palpitations, loss of consciousness Resp:  Negative for cough, wheezing, shortness of breath GI:  Negative for nausea, vomiting, diarrhea, bloody stools GU:  Positives noted in HPI; otherwise negative for gross hematuria, dysuria, urinary incontinence Neuro:  Negative for seizures, poor balance, limb weakness, slurred  speech Psych:  Negative for lack of energy, depression, anxiety Endocrine:  Negative for polydipsia, polyuria, symptoms of hypoglycemia (dizziness, hunger, sweating) Hematologic:  Negative for anemia, purpura, petechia, prolonged or excessive bleeding, use of anticoagulants  Allergic:  Negative for difficulty breathing or choking as a result of exposure to anything; no shellfish allergy; no allergic response (rash/itch) to materials, foods  Physical exam: There were no vitals taken for this visit. GENERAL APPEARANCE:  Well appearing, well developed, well nourished, NAD HEENT: Atraumatic, Normocephalic. NECK: Normal appearance LUNGS: Normal inspiratory and expiratory excursion HEART: Regular Rate ABDOMEN: ***. GU: Phallus normal, no lesions. Scrotal skin normal. Testicles/epididymal structures normal. Meatus normal. Normal anal sphincter tone, prostate ***mL, symmetric, non nodular, non tender. EXTREMITIES: Moves all extremities well.  Without clubbing, cyanosis, or edema. NEUROLOGIC:  Alert and oriented x 3, normal gait, CN II-XII grossly intact.  MENTAL STATUS:  Appropriate. SKIN:  Warm, dry and intact.    Results: No results found for this or any previous visit (from the past 24 hours).  I [facility] referring[facility]  I have reviewed urinalysis  I have reviewed PSA and testosterone  results  I have reviewed prior imaging    Assessment: ***   Plan: ***

## 2024-04-22 ENCOUNTER — Other Ambulatory Visit (HOSPITAL_BASED_OUTPATIENT_CLINIC_OR_DEPARTMENT_OTHER): Payer: Self-pay

## 2024-04-22 ENCOUNTER — Ambulatory Visit (INDEPENDENT_AMBULATORY_CARE_PROVIDER_SITE_OTHER): Payer: Self-pay | Admitting: Urology

## 2024-04-22 VITALS — BP 134/83 | HR 80 | Ht 74.0 in | Wt 295.0 lb

## 2024-04-22 DIAGNOSIS — R972 Elevated prostate specific antigen [PSA]: Secondary | ICD-10-CM

## 2024-04-22 DIAGNOSIS — R3129 Other microscopic hematuria: Secondary | ICD-10-CM | POA: Diagnosis not present

## 2024-04-22 DIAGNOSIS — E291 Testicular hypofunction: Secondary | ICD-10-CM | POA: Diagnosis not present

## 2024-04-22 LAB — MICROSCOPIC EXAMINATION: Bacteria, UA: NONE SEEN

## 2024-04-22 LAB — URINALYSIS, ROUTINE W REFLEX MICROSCOPIC
Bilirubin, UA: NEGATIVE
Glucose, UA: NEGATIVE
Nitrite, UA: NEGATIVE
RBC, UA: NEGATIVE
Specific Gravity, UA: 1.02 (ref 1.005–1.030)
Urobilinogen, Ur: 1 mg/dL (ref 0.2–1.0)
pH, UA: 6 (ref 5.0–7.5)

## 2024-04-22 MED ORDER — "BD ECLIPSE SHIELDED NEEDLE 18G X 1-1/2"" MISC"
3 refills | Status: AC
  Filled 2024-04-22: qty 15, fill #0
  Filled 2024-08-29: qty 4, 28d supply, fill #0
  Filled 2024-10-20: qty 15, 15d supply, fill #0

## 2024-04-22 MED ORDER — BD SYRINGE SLIP TIP 3 ML MISC
3 refills | Status: AC
  Filled 2024-04-22: qty 15, fill #0
  Filled 2024-06-26: qty 4, 28d supply, fill #0
  Filled 2024-08-29 – 2024-10-20 (×2): qty 4, 28d supply, fill #1

## 2024-04-22 MED ORDER — TESTOSTERONE CYPIONATE 200 MG/ML IM SOLN
INTRAMUSCULAR | 1 refills | Status: AC
  Filled 2024-04-22: qty 4, 28d supply, fill #0
  Filled 2024-07-25: qty 4, 28d supply, fill #1
  Filled 2024-09-23 – 2024-09-24 (×2): qty 4, 28d supply, fill #2

## 2024-04-22 NOTE — Addendum Note (Signed)
 Addended by: KOLEEN MITZIE BROCKS on: 04/22/2024 02:16 PM   Modules accepted: Orders

## 2024-04-23 ENCOUNTER — Encounter: Payer: Self-pay | Admitting: Urology

## 2024-04-23 ENCOUNTER — Encounter (INDEPENDENT_AMBULATORY_CARE_PROVIDER_SITE_OTHER): Payer: Self-pay | Admitting: Family Medicine

## 2024-04-28 DIAGNOSIS — F331 Major depressive disorder, recurrent, moderate: Secondary | ICD-10-CM | POA: Diagnosis not present

## 2024-04-28 DIAGNOSIS — F1020 Alcohol dependence, uncomplicated: Secondary | ICD-10-CM | POA: Diagnosis not present

## 2024-04-28 DIAGNOSIS — F4310 Post-traumatic stress disorder, unspecified: Secondary | ICD-10-CM | POA: Diagnosis not present

## 2024-04-29 NOTE — Telephone Encounter (Signed)
I really don't know.

## 2024-04-30 ENCOUNTER — Other Ambulatory Visit

## 2024-04-30 DIAGNOSIS — F4310 Post-traumatic stress disorder, unspecified: Secondary | ICD-10-CM | POA: Diagnosis not present

## 2024-04-30 DIAGNOSIS — F331 Major depressive disorder, recurrent, moderate: Secondary | ICD-10-CM | POA: Diagnosis not present

## 2024-04-30 DIAGNOSIS — F1020 Alcohol dependence, uncomplicated: Secondary | ICD-10-CM | POA: Diagnosis not present

## 2024-05-08 ENCOUNTER — Other Ambulatory Visit (HOSPITAL_BASED_OUTPATIENT_CLINIC_OR_DEPARTMENT_OTHER): Payer: Self-pay

## 2024-05-09 ENCOUNTER — Other Ambulatory Visit (INDEPENDENT_AMBULATORY_CARE_PROVIDER_SITE_OTHER): Payer: Self-pay | Admitting: Family Medicine

## 2024-05-09 ENCOUNTER — Other Ambulatory Visit (HOSPITAL_BASED_OUTPATIENT_CLINIC_OR_DEPARTMENT_OTHER): Payer: Self-pay

## 2024-05-09 DIAGNOSIS — E1169 Type 2 diabetes mellitus with other specified complication: Secondary | ICD-10-CM

## 2024-05-11 ENCOUNTER — Other Ambulatory Visit: Payer: Self-pay

## 2024-05-11 ENCOUNTER — Encounter (HOSPITAL_BASED_OUTPATIENT_CLINIC_OR_DEPARTMENT_OTHER): Payer: Self-pay

## 2024-05-11 ENCOUNTER — Other Ambulatory Visit (HOSPITAL_BASED_OUTPATIENT_CLINIC_OR_DEPARTMENT_OTHER): Payer: Self-pay

## 2024-05-11 ENCOUNTER — Other Ambulatory Visit (INDEPENDENT_AMBULATORY_CARE_PROVIDER_SITE_OTHER): Payer: Self-pay | Admitting: Family Medicine

## 2024-05-11 DIAGNOSIS — N529 Male erectile dysfunction, unspecified: Secondary | ICD-10-CM | POA: Diagnosis not present

## 2024-05-11 DIAGNOSIS — E291 Testicular hypofunction: Secondary | ICD-10-CM | POA: Diagnosis not present

## 2024-05-11 DIAGNOSIS — E1169 Type 2 diabetes mellitus with other specified complication: Secondary | ICD-10-CM

## 2024-05-11 MED ORDER — TADALAFIL 20 MG PO TABS
20.0000 mg | ORAL_TABLET | Freq: Every day | ORAL | 3 refills | Status: AC | PRN
  Filled 2024-05-11 – 2024-07-25 (×3): qty 30, 30d supply, fill #0
  Filled 2024-09-29: qty 30, 30d supply, fill #1
  Filled 2024-10-20 – 2024-11-19 (×2): qty 30, 30d supply, fill #2

## 2024-05-11 MED ORDER — TESTOSTERONE CYPIONATE 200 MG/ML IM SOLN
100.0000 mg | INTRAMUSCULAR | 2 refills | Status: AC
  Filled 2024-05-11: qty 5, 70d supply, fill #0
  Filled 2024-05-28: qty 4, 28d supply, fill #0
  Filled 2024-06-26: qty 4, 28d supply, fill #1
  Filled 2024-08-26: qty 4, 28d supply, fill #2
  Filled 2024-10-20: qty 4, 28d supply, fill #3
  Filled 2024-10-28: qty 3, 21d supply, fill #3

## 2024-05-12 ENCOUNTER — Telehealth (INDEPENDENT_AMBULATORY_CARE_PROVIDER_SITE_OTHER): Payer: Self-pay | Admitting: Family Medicine

## 2024-05-12 ENCOUNTER — Other Ambulatory Visit (HOSPITAL_BASED_OUTPATIENT_CLINIC_OR_DEPARTMENT_OTHER): Payer: Self-pay

## 2024-05-12 ENCOUNTER — Encounter (INDEPENDENT_AMBULATORY_CARE_PROVIDER_SITE_OTHER): Payer: Self-pay | Admitting: Family Medicine

## 2024-05-12 ENCOUNTER — Encounter (HOSPITAL_BASED_OUTPATIENT_CLINIC_OR_DEPARTMENT_OTHER): Payer: Self-pay

## 2024-05-12 ENCOUNTER — Other Ambulatory Visit (INDEPENDENT_AMBULATORY_CARE_PROVIDER_SITE_OTHER): Payer: Self-pay | Admitting: Family Medicine

## 2024-05-12 ENCOUNTER — Ambulatory Visit (INDEPENDENT_AMBULATORY_CARE_PROVIDER_SITE_OTHER): Admitting: Family Medicine

## 2024-05-12 ENCOUNTER — Other Ambulatory Visit: Payer: Self-pay

## 2024-05-12 DIAGNOSIS — E1169 Type 2 diabetes mellitus with other specified complication: Secondary | ICD-10-CM

## 2024-05-12 MED ORDER — TIRZEPATIDE 12.5 MG/0.5ML ~~LOC~~ SOAJ
12.5000 mg | SUBCUTANEOUS | 0 refills | Status: DC
  Filled 2024-05-12: qty 2, 28d supply, fill #0
  Filled 2024-06-11: qty 2, 28d supply, fill #1

## 2024-05-14 ENCOUNTER — Encounter (HOSPITAL_BASED_OUTPATIENT_CLINIC_OR_DEPARTMENT_OTHER): Payer: Self-pay

## 2024-05-14 ENCOUNTER — Other Ambulatory Visit (HOSPITAL_BASED_OUTPATIENT_CLINIC_OR_DEPARTMENT_OTHER): Payer: Self-pay

## 2024-05-15 ENCOUNTER — Other Ambulatory Visit (HOSPITAL_BASED_OUTPATIENT_CLINIC_OR_DEPARTMENT_OTHER): Payer: Self-pay

## 2024-05-15 DIAGNOSIS — F4310 Post-traumatic stress disorder, unspecified: Secondary | ICD-10-CM | POA: Diagnosis not present

## 2024-05-15 DIAGNOSIS — F331 Major depressive disorder, recurrent, moderate: Secondary | ICD-10-CM | POA: Diagnosis not present

## 2024-05-15 DIAGNOSIS — J329 Chronic sinusitis, unspecified: Secondary | ICD-10-CM | POA: Diagnosis not present

## 2024-05-15 DIAGNOSIS — F1020 Alcohol dependence, uncomplicated: Secondary | ICD-10-CM | POA: Diagnosis not present

## 2024-05-15 MED ORDER — FLUTICASONE PROPIONATE 50 MCG/ACT NA SUSP
1.0000 | Freq: Two times a day (BID) | NASAL | 0 refills | Status: DC
  Filled 2024-05-15: qty 16, 30d supply, fill #0

## 2024-05-15 MED ORDER — DOXYCYCLINE HYCLATE 100 MG PO TABS
100.0000 mg | ORAL_TABLET | Freq: Two times a day (BID) | ORAL | 0 refills | Status: DC
  Filled 2024-05-15: qty 20, 10d supply, fill #0

## 2024-05-19 ENCOUNTER — Other Ambulatory Visit (HOSPITAL_BASED_OUTPATIENT_CLINIC_OR_DEPARTMENT_OTHER): Payer: Self-pay

## 2024-05-19 ENCOUNTER — Encounter (HOSPITAL_BASED_OUTPATIENT_CLINIC_OR_DEPARTMENT_OTHER): Payer: Self-pay

## 2024-05-19 DIAGNOSIS — F331 Major depressive disorder, recurrent, moderate: Secondary | ICD-10-CM | POA: Diagnosis not present

## 2024-05-19 DIAGNOSIS — F1020 Alcohol dependence, uncomplicated: Secondary | ICD-10-CM | POA: Diagnosis not present

## 2024-05-19 DIAGNOSIS — F4310 Post-traumatic stress disorder, unspecified: Secondary | ICD-10-CM | POA: Diagnosis not present

## 2024-05-19 MED ORDER — TRINTELLIX 10 MG PO TABS
10.0000 mg | ORAL_TABLET | Freq: Every day | ORAL | 0 refills | Status: AC
  Filled 2024-05-19: qty 90, 90d supply, fill #0
  Filled 2024-06-11: qty 30, 30d supply, fill #0
  Filled 2024-07-25: qty 90, 90d supply, fill #0

## 2024-05-19 MED ORDER — BUPROPION HCL ER (XL) 300 MG PO TB24
300.0000 mg | ORAL_TABLET | Freq: Every morning | ORAL | 0 refills | Status: AC
  Filled 2024-05-19: qty 90, 90d supply, fill #0
  Filled 2024-06-11 – 2024-08-05 (×2): qty 30, 30d supply, fill #0

## 2024-05-19 MED ORDER — HYDROXYZINE HCL 10 MG PO TABS
10.0000 mg | ORAL_TABLET | Freq: Two times a day (BID) | ORAL | 0 refills | Status: DC | PRN
  Filled 2024-05-19: qty 60, 30d supply, fill #0
  Filled 2024-08-05: qty 60, 30d supply, fill #1
  Filled 2024-09-04: qty 60, 30d supply, fill #2

## 2024-05-23 ENCOUNTER — Other Ambulatory Visit (HOSPITAL_BASED_OUTPATIENT_CLINIC_OR_DEPARTMENT_OTHER): Payer: Self-pay

## 2024-05-28 ENCOUNTER — Other Ambulatory Visit (HOSPITAL_COMMUNITY): Payer: Self-pay

## 2024-05-28 ENCOUNTER — Other Ambulatory Visit (HOSPITAL_BASED_OUTPATIENT_CLINIC_OR_DEPARTMENT_OTHER): Payer: Self-pay

## 2024-05-28 DIAGNOSIS — F331 Major depressive disorder, recurrent, moderate: Secondary | ICD-10-CM | POA: Diagnosis not present

## 2024-05-28 DIAGNOSIS — F1020 Alcohol dependence, uncomplicated: Secondary | ICD-10-CM | POA: Diagnosis not present

## 2024-05-28 DIAGNOSIS — F4310 Post-traumatic stress disorder, unspecified: Secondary | ICD-10-CM | POA: Diagnosis not present

## 2024-06-05 DIAGNOSIS — M2550 Pain in unspecified joint: Secondary | ICD-10-CM | POA: Diagnosis not present

## 2024-06-05 DIAGNOSIS — Z0000 Encounter for general adult medical examination without abnormal findings: Secondary | ICD-10-CM | POA: Diagnosis not present

## 2024-06-05 LAB — POCT ERYTHROCYTE SEDIMENTATION RATE, NON-AUTOMATED: Sed Rate: 10

## 2024-06-08 ENCOUNTER — Encounter (INDEPENDENT_AMBULATORY_CARE_PROVIDER_SITE_OTHER): Payer: Self-pay

## 2024-06-10 DIAGNOSIS — F331 Major depressive disorder, recurrent, moderate: Secondary | ICD-10-CM | POA: Diagnosis not present

## 2024-06-10 DIAGNOSIS — F1020 Alcohol dependence, uncomplicated: Secondary | ICD-10-CM | POA: Diagnosis not present

## 2024-06-10 DIAGNOSIS — F4310 Post-traumatic stress disorder, unspecified: Secondary | ICD-10-CM | POA: Diagnosis not present

## 2024-06-11 ENCOUNTER — Other Ambulatory Visit (HOSPITAL_BASED_OUTPATIENT_CLINIC_OR_DEPARTMENT_OTHER): Payer: Self-pay

## 2024-06-11 ENCOUNTER — Other Ambulatory Visit: Payer: Self-pay

## 2024-06-12 ENCOUNTER — Other Ambulatory Visit (HOSPITAL_BASED_OUTPATIENT_CLINIC_OR_DEPARTMENT_OTHER): Payer: Self-pay

## 2024-06-12 MED ORDER — FLUTICASONE PROPIONATE 50 MCG/ACT NA SUSP
1.0000 | Freq: Two times a day (BID) | NASAL | 0 refills | Status: AC
  Filled 2024-06-12: qty 16, 30d supply, fill #0

## 2024-06-15 ENCOUNTER — Other Ambulatory Visit (HOSPITAL_BASED_OUTPATIENT_CLINIC_OR_DEPARTMENT_OTHER): Payer: Self-pay

## 2024-06-16 DIAGNOSIS — F3341 Major depressive disorder, recurrent, in partial remission: Secondary | ICD-10-CM | POA: Diagnosis not present

## 2024-06-16 DIAGNOSIS — F419 Anxiety disorder, unspecified: Secondary | ICD-10-CM | POA: Diagnosis not present

## 2024-06-16 DIAGNOSIS — L700 Acne vulgaris: Secondary | ICD-10-CM | POA: Diagnosis not present

## 2024-06-23 ENCOUNTER — Encounter (INDEPENDENT_AMBULATORY_CARE_PROVIDER_SITE_OTHER): Payer: Self-pay | Admitting: Family Medicine

## 2024-06-23 ENCOUNTER — Ambulatory Visit (INDEPENDENT_AMBULATORY_CARE_PROVIDER_SITE_OTHER): Admitting: Family Medicine

## 2024-06-23 ENCOUNTER — Other Ambulatory Visit (HOSPITAL_BASED_OUTPATIENT_CLINIC_OR_DEPARTMENT_OTHER): Payer: Self-pay

## 2024-06-23 VITALS — BP 98/65 | HR 86 | Temp 98.2°F | Ht 74.0 in | Wt 286.0 lb

## 2024-06-23 DIAGNOSIS — Z7985 Long-term (current) use of injectable non-insulin antidiabetic drugs: Secondary | ICD-10-CM

## 2024-06-23 DIAGNOSIS — Z6836 Body mass index (BMI) 36.0-36.9, adult: Secondary | ICD-10-CM

## 2024-06-23 DIAGNOSIS — E785 Hyperlipidemia, unspecified: Secondary | ICD-10-CM

## 2024-06-23 DIAGNOSIS — Z6841 Body Mass Index (BMI) 40.0 and over, adult: Secondary | ICD-10-CM

## 2024-06-23 DIAGNOSIS — E669 Obesity, unspecified: Secondary | ICD-10-CM | POA: Diagnosis not present

## 2024-06-23 DIAGNOSIS — E119 Type 2 diabetes mellitus without complications: Secondary | ICD-10-CM | POA: Diagnosis not present

## 2024-06-23 DIAGNOSIS — E1169 Type 2 diabetes mellitus with other specified complication: Secondary | ICD-10-CM | POA: Diagnosis not present

## 2024-06-23 DIAGNOSIS — Z6838 Body mass index (BMI) 38.0-38.9, adult: Secondary | ICD-10-CM

## 2024-06-23 DIAGNOSIS — E559 Vitamin D deficiency, unspecified: Secondary | ICD-10-CM | POA: Diagnosis not present

## 2024-06-23 DIAGNOSIS — E782 Mixed hyperlipidemia: Secondary | ICD-10-CM

## 2024-06-23 MED ORDER — VITAMIN D (ERGOCALCIFEROL) 1.25 MG (50000 UNIT) PO CAPS
50000.0000 [IU] | ORAL_CAPSULE | ORAL | 0 refills | Status: DC
  Filled 2024-06-23: qty 12, 84d supply, fill #0
  Filled 2024-07-20: qty 4, 28d supply, fill #0
  Filled 2024-08-04 – 2024-08-07 (×2): qty 4, 28d supply, fill #1

## 2024-06-23 MED ORDER — TIRZEPATIDE 12.5 MG/0.5ML ~~LOC~~ SOAJ
12.5000 mg | SUBCUTANEOUS | 0 refills | Status: DC
  Filled 2024-06-23: qty 6, 84d supply, fill #0
  Filled 2024-07-22: qty 2, 28d supply, fill #0

## 2024-06-23 NOTE — Progress Notes (Signed)
 Office: [phone]  /  Fax: [phone]  WEIGHT SUMMARY AND BIOMETRICS  Anthropometric Measurements Height: 6' 2 (1.88 m) Weight: 286 lb (129.7 kg) BMI (Calculated): 36.7 Weight at Last Visit: 298 lb Weight Lost Since Last Visit: 12 lb Weight Gained Since Last Visit: 0 Starting Weight: 342 lb Total Weight Loss (lbs): 56 lb (25.4 kg) Peak Weight: 342 lb   Body Composition  Body Fat %: 36.5 % Fat Mass (lbs): 104.6 lbs Muscle Mass (lbs): 173 lbs Total Body Water (lbs): 126 lbs Visceral Fat Rating : 22   Other Clinical Data Fasting: yes Labs: yes Today's Visit #: 42 Starting Date: 03/24/20    Chief Complaint: OBESITY    History of Present Illness Luis Russell is a 58 year old male with obesity and type 2 diabetes who presents for obesity treatment and progress assessment.  He is adhering to a category four eating plan about fifty percent of the time, focusing on adequate protein intake and not skipping meals. However, he struggles with consuming enough fruits and vegetables and maintaining hydration. He exercises for about thirty minutes, five days a week, and engages in non-exercise activity thermogenesis through yard work, pool work, and housework.  He is being treated for type 2 diabetes with Mounjaro . No symptoms of hypoglycemia are reported.  He has a vitamin D  deficiency and is on prescription vitamin D . His last vitamin D  level was lower than ideal, and he is due for lab work to reassess his levels.  He has hyperlipidemia and is attempting to manage it with diet, exercise, and weight loss. He is not currently on a statin, and lab work is planned to monitor cholesterol levels.  He feels 'okay' overall but mentions having a mild cold from being in the water and the ocean. He recently went to the beach and noted the pool was cold upon returning. His sleep is 'pretty good' with the use of a CPAP machine, averaging six to seven hours of deep sleep per  night. His stress level has been high this summer, leading him to take some time away from work.      PHYSICAL EXAM:  Blood pressure 98/65, pulse 86, temperature 98.2 F (36.8 C), height 6' 2 (1.88 m), weight 286 lb (129.7 kg), SpO2 94%. Body mass index is 36.72 kg/m.  DIAGNOSTIC DATA REVIEWED:  BMET    Component Value Date/Time   NA 140 02/05/2024 1046   K 4.3 02/05/2024 1046   CL 104 02/05/2024 1046   CO2 20 02/05/2024 1046   GLUCOSE 88 02/05/2024 1046   GLUCOSE 146 (H) 07/09/2016 1823   BUN 14 02/05/2024 1046   CREATININE 1.02 02/05/2024 1046   CALCIUM  8.8 02/05/2024 1046   GFRNONAA 75 12/08/2020 1402   GFRAA 87 12/08/2020 1402   Lab Results  Component Value Date   HGBA1C 5.5 02/05/2024   HGBA1C 5.8 (H) 06/08/2016   Lab Results  Component Value Date   INSULIN  12.2 02/05/2024   INSULIN  33.8 (H) 03/24/2020   Lab Results  Component Value Date   TSH 3.460 02/05/2024   CBC    Component Value Date/Time   WBC 7.4 03/24/2020 1234   WBC 8.9 07/09/2016 1823   RBC 4.56 03/24/2020 1234   RBC 4.51 07/09/2016 1823   HGB 16.0 01/27/2024 0000   HGB 13.4 03/24/2020 1234   HCT 49 01/27/2024 0000   HCT 41.1 03/24/2020 1234   PLT 210 03/24/2020 1234   MCV 90 03/24/2020 1234   MCH  29.4 03/24/2020 1234   MCH 29.3 07/09/2016 1823   MCHC 32.6 03/24/2020 1234   MCHC 32.1 07/09/2016 1823   RDW 13.0 03/24/2020 1234   Iron Studies No results found for: IRON, TIBC, FERRITIN, IRONPCTSAT Lipid Panel     Component Value Date/Time   CHOL 212 (H) 02/05/2024 1046   TRIG 74 02/05/2024 1046   HDL 63 02/05/2024 1046   CHOLHDL 4.5 12/08/2020 1402   LDLCALC 136 (H) 02/05/2024 1046   Hepatic Function Panel     Component Value Date/Time   PROT 6.5 02/05/2024 1046   ALBUMIN 4.2 02/05/2024 1046   AST 31 02/05/2024 1046   ALT 29 02/05/2024 1046   ALKPHOS 57 02/05/2024 1046   BILITOT 0.7 02/05/2024 1046      Component Value Date/Time   TSH 3.460 02/05/2024 1046    Nutritional Lab Results  Component Value Date   VD25OH 24.1 (L) 02/05/2024   VD25OH 33.1 04/30/2023   VD25OH 44.6 01/08/2023     Assessment and Plan Assessment & Plan Obesity Obesity management with a category four eating plan, adherence approximately 50%. Engaging in 30 minutes of exercise five days a week and NEAT activities. Challenges with protein intake, meal completion, and hydration. Advised to prioritize protein intake to maintain muscle mass. - Continue category four eating plan - Encourage completion of meals starting with protein - Continue exercise regimen - Increase intake of fruits and vegetables - Ensure adequate hydration  Type 2 diabetes mellitus Type 2 diabetes managed with Mounjaro , diet, exercise, and weight loss. No reported episodes of hypoglycemia. Due for lab work to monitor blood sugar levels. Discussed the need for timely medication refill to avoid prior authorization issues. - Continue Mounjaro  - Order lab work to monitor blood sugar levels - Ensure medication refill before October - Continue diet, exercise and weight loss as discussed today as an important part of the treatment plan  Vitamin D  deficiency Vitamin D  deficiency treated with prescription vitamin D . Last level was suboptimal. Due for lab work to reassess vitamin D  levels. - Continue prescription vitamin D  - Order lab work to reassess vitamin D  levels  Hyperlipidemia Hyperlipidemia managed with diet, exercise, and weight loss. Not currently on statin therapy. Due for lab work to monitor cholesterol levels. - Order lab work to monitor cholesterol levels - Continue diet, exercise and weight loss as discussed today as an important part of the treatment plan     He was informed of the importance of frequent follow up visits to maximize his success with intensive lifestyle modifications for his multiple health conditions.    Louann Penton, MD

## 2024-06-25 LAB — CBC WITH DIFFERENTIAL/PLATELET
Basophils Absolute: 0 x10E3/uL (ref 0.0–0.2)
Basos: 0 %
EOS (ABSOLUTE): 0.2 x10E3/uL (ref 0.0–0.4)
Eos: 4 %
Hematocrit: 44.8 % (ref 37.5–51.0)
Hemoglobin: 14.8 g/dL (ref 13.0–17.7)
Immature Grans (Abs): 0 x10E3/uL (ref 0.0–0.1)
Immature Granulocytes: 0 %
Lymphocytes Absolute: 1.2 x10E3/uL (ref 0.7–3.1)
Lymphs: 22 %
MCH: 30.3 pg (ref 26.6–33.0)
MCHC: 33 g/dL (ref 31.5–35.7)
MCV: 92 fL (ref 79–97)
Monocytes Absolute: 0.8 x10E3/uL (ref 0.1–0.9)
Monocytes: 14 %
Neutrophils Absolute: 3.2 x10E3/uL (ref 1.4–7.0)
Neutrophils: 60 %
Platelets: 186 x10E3/uL (ref 150–450)
RBC: 4.88 x10E6/uL (ref 4.14–5.80)
RDW: 12.7 % (ref 11.6–15.4)
WBC: 5.4 x10E3/uL (ref 3.4–10.8)

## 2024-06-25 LAB — LIPID PANEL WITH LDL/HDL RATIO
Cholesterol, Total: 226 mg/dL — ABNORMAL HIGH (ref 100–199)
HDL: 62 mg/dL (ref >39)
LDL Chol Calc (NIH): 152 mg/dL — ABNORMAL HIGH (ref 0–99)
LDL/HDL Ratio: 2.5 ratio (ref 0.0–3.6)
Triglycerides: 67 mg/dL (ref 0–149)
VLDL Cholesterol Cal: 12 mg/dL (ref 5–40)

## 2024-06-25 LAB — CMP14+EGFR
ALT: 24 IU/L (ref 0–44)
AST: 23 IU/L (ref 0–40)
Albumin: 4.2 g/dL (ref 3.8–4.9)
Alkaline Phosphatase: 66 IU/L (ref 44–121)
BUN/Creatinine Ratio: 10 (ref 10–24)
BUN: 11 mg/dL (ref 8–27)
Bilirubin Total: 1.2 mg/dL (ref 0.0–1.2)
CO2: 23 mmol/L (ref 20–29)
Calcium: 8.8 mg/dL (ref 8.6–10.2)
Chloride: 101 mmol/L (ref 96–106)
Creatinine, Ser: 1.06 mg/dL (ref 0.76–1.27)
Globulin, Total: 2.4 g/dL (ref 1.5–4.5)
Glucose: 94 mg/dL (ref 70–99)
Potassium: 4.2 mmol/L (ref 3.5–5.2)
Sodium: 138 mmol/L (ref 134–144)
Total Protein: 6.6 g/dL (ref 6.0–8.5)
eGFR: 80 mL/min/1.73 (ref >59)

## 2024-06-25 LAB — HEMOGLOBIN A1C
Est. average glucose Bld gHb Est-mCnc: 103 mg/dL
Hgb A1c MFr Bld: 5.2 % (ref 4.8–5.6)

## 2024-06-25 LAB — VITAMIN D 25 HYDROXY (VIT D DEFICIENCY, FRACTURES): Vit D, 25-Hydroxy: 43.9 ng/mL (ref 30.0–100.0)

## 2024-06-25 LAB — TSH: TSH: 3.71 u[IU]/mL (ref 0.450–4.500)

## 2024-06-25 LAB — INSULIN, RANDOM: INSULIN: 16.5 u[IU]/mL (ref 2.6–24.9)

## 2024-06-25 LAB — VITAMIN B12: Vitamin B-12: 371 pg/mL (ref 232–1245)

## 2024-06-26 ENCOUNTER — Other Ambulatory Visit (HOSPITAL_BASED_OUTPATIENT_CLINIC_OR_DEPARTMENT_OTHER): Payer: Self-pay

## 2024-07-01 ENCOUNTER — Other Ambulatory Visit (HOSPITAL_BASED_OUTPATIENT_CLINIC_OR_DEPARTMENT_OTHER): Payer: Self-pay

## 2024-07-01 DIAGNOSIS — J0100 Acute maxillary sinusitis, unspecified: Secondary | ICD-10-CM | POA: Diagnosis not present

## 2024-07-01 MED ORDER — CEFDINIR 300 MG PO CAPS
300.0000 mg | ORAL_CAPSULE | Freq: Two times a day (BID) | ORAL | 0 refills | Status: AC
  Filled 2024-07-01: qty 14, 7d supply, fill #0

## 2024-07-01 MED ORDER — METHYLPREDNISOLONE 4 MG PO TBPK
ORAL_TABLET | ORAL | 0 refills | Status: DC
  Filled 2024-07-01: qty 21, 6d supply, fill #0

## 2024-07-08 DIAGNOSIS — F4310 Post-traumatic stress disorder, unspecified: Secondary | ICD-10-CM | POA: Diagnosis not present

## 2024-07-08 DIAGNOSIS — F331 Major depressive disorder, recurrent, moderate: Secondary | ICD-10-CM | POA: Diagnosis not present

## 2024-07-08 DIAGNOSIS — F1020 Alcohol dependence, uncomplicated: Secondary | ICD-10-CM | POA: Diagnosis not present

## 2024-07-09 ENCOUNTER — Other Ambulatory Visit: Payer: Self-pay

## 2024-07-09 ENCOUNTER — Encounter (INDEPENDENT_AMBULATORY_CARE_PROVIDER_SITE_OTHER): Payer: Self-pay | Admitting: Family Medicine

## 2024-07-09 ENCOUNTER — Other Ambulatory Visit (HOSPITAL_BASED_OUTPATIENT_CLINIC_OR_DEPARTMENT_OTHER): Payer: Self-pay

## 2024-07-09 DIAGNOSIS — L700 Acne vulgaris: Secondary | ICD-10-CM | POA: Diagnosis not present

## 2024-07-09 DIAGNOSIS — H6693 Otitis media, unspecified, bilateral: Secondary | ICD-10-CM | POA: Diagnosis not present

## 2024-07-09 MED ORDER — BENZOYL PEROXIDE 5 % EX GEL
1.0000 | Freq: Every day | CUTANEOUS | 1 refills | Status: AC
  Filled 2024-07-09: qty 42.5, 30d supply, fill #0
  Filled 2024-07-10: qty 60, 30d supply, fill #0

## 2024-07-09 MED ORDER — DOXYCYCLINE HYCLATE 100 MG PO TABS
100.0000 mg | ORAL_TABLET | Freq: Two times a day (BID) | ORAL | 0 refills | Status: DC
  Filled 2024-07-09: qty 20, 10d supply, fill #0

## 2024-07-10 ENCOUNTER — Other Ambulatory Visit (HOSPITAL_BASED_OUTPATIENT_CLINIC_OR_DEPARTMENT_OTHER): Payer: Self-pay

## 2024-07-10 ENCOUNTER — Other Ambulatory Visit: Payer: Self-pay

## 2024-07-13 ENCOUNTER — Other Ambulatory Visit (HOSPITAL_BASED_OUTPATIENT_CLINIC_OR_DEPARTMENT_OTHER): Payer: Self-pay

## 2024-07-13 DIAGNOSIS — F1020 Alcohol dependence, uncomplicated: Secondary | ICD-10-CM | POA: Diagnosis not present

## 2024-07-13 DIAGNOSIS — F331 Major depressive disorder, recurrent, moderate: Secondary | ICD-10-CM | POA: Diagnosis not present

## 2024-07-13 DIAGNOSIS — F4310 Post-traumatic stress disorder, unspecified: Secondary | ICD-10-CM | POA: Diagnosis not present

## 2024-07-20 ENCOUNTER — Other Ambulatory Visit (INDEPENDENT_AMBULATORY_CARE_PROVIDER_SITE_OTHER): Payer: Self-pay | Admitting: Family Medicine

## 2024-07-20 ENCOUNTER — Other Ambulatory Visit: Payer: Self-pay

## 2024-07-20 ENCOUNTER — Other Ambulatory Visit (HOSPITAL_BASED_OUTPATIENT_CLINIC_OR_DEPARTMENT_OTHER): Payer: Self-pay

## 2024-07-20 DIAGNOSIS — F1020 Alcohol dependence, uncomplicated: Secondary | ICD-10-CM | POA: Diagnosis not present

## 2024-07-20 DIAGNOSIS — Z23 Encounter for immunization: Secondary | ICD-10-CM | POA: Diagnosis not present

## 2024-07-20 DIAGNOSIS — F331 Major depressive disorder, recurrent, moderate: Secondary | ICD-10-CM | POA: Diagnosis not present

## 2024-07-20 DIAGNOSIS — H9202 Otalgia, left ear: Secondary | ICD-10-CM | POA: Diagnosis not present

## 2024-07-20 DIAGNOSIS — F3341 Major depressive disorder, recurrent, in partial remission: Secondary | ICD-10-CM | POA: Diagnosis not present

## 2024-07-20 DIAGNOSIS — F4310 Post-traumatic stress disorder, unspecified: Secondary | ICD-10-CM | POA: Diagnosis not present

## 2024-07-20 DIAGNOSIS — G4709 Other insomnia: Secondary | ICD-10-CM

## 2024-07-20 DIAGNOSIS — F419 Anxiety disorder, unspecified: Secondary | ICD-10-CM | POA: Diagnosis not present

## 2024-07-22 ENCOUNTER — Other Ambulatory Visit (INDEPENDENT_AMBULATORY_CARE_PROVIDER_SITE_OTHER): Payer: Self-pay

## 2024-07-22 ENCOUNTER — Other Ambulatory Visit (INDEPENDENT_AMBULATORY_CARE_PROVIDER_SITE_OTHER): Payer: Self-pay | Admitting: Family Medicine

## 2024-07-22 ENCOUNTER — Encounter (HOSPITAL_COMMUNITY): Payer: Self-pay | Admitting: Pharmacist

## 2024-07-22 ENCOUNTER — Other Ambulatory Visit (HOSPITAL_BASED_OUTPATIENT_CLINIC_OR_DEPARTMENT_OTHER): Payer: Self-pay

## 2024-07-22 DIAGNOSIS — G4709 Other insomnia: Secondary | ICD-10-CM

## 2024-07-23 ENCOUNTER — Other Ambulatory Visit: Payer: Self-pay

## 2024-07-27 ENCOUNTER — Other Ambulatory Visit: Payer: Self-pay

## 2024-07-27 ENCOUNTER — Other Ambulatory Visit (HOSPITAL_BASED_OUTPATIENT_CLINIC_OR_DEPARTMENT_OTHER): Payer: Self-pay

## 2024-07-27 DIAGNOSIS — F4310 Post-traumatic stress disorder, unspecified: Secondary | ICD-10-CM | POA: Diagnosis not present

## 2024-07-27 DIAGNOSIS — F331 Major depressive disorder, recurrent, moderate: Secondary | ICD-10-CM | POA: Diagnosis not present

## 2024-07-27 DIAGNOSIS — F1020 Alcohol dependence, uncomplicated: Secondary | ICD-10-CM | POA: Diagnosis not present

## 2024-07-28 ENCOUNTER — Other Ambulatory Visit (HOSPITAL_BASED_OUTPATIENT_CLINIC_OR_DEPARTMENT_OTHER): Payer: Self-pay

## 2024-07-28 DIAGNOSIS — H6993 Unspecified Eustachian tube disorder, bilateral: Secondary | ICD-10-CM | POA: Diagnosis not present

## 2024-07-28 DIAGNOSIS — M26609 Unspecified temporomandibular joint disorder, unspecified side: Secondary | ICD-10-CM | POA: Diagnosis not present

## 2024-07-28 MED ORDER — PREDNISONE 10 MG (21) PO TBPK
ORAL_TABLET | ORAL | 0 refills | Status: DC
  Filled 2024-07-28: qty 21, 6d supply, fill #0

## 2024-07-28 MED ORDER — TRAZODONE HCL 50 MG PO TABS
50.0000 mg | ORAL_TABLET | Freq: Every evening | ORAL | 3 refills | Status: AC | PRN
  Filled 2024-07-28: qty 30, 30d supply, fill #0
  Filled 2024-09-04: qty 30, 30d supply, fill #1
  Filled 2024-09-29: qty 30, 30d supply, fill #2

## 2024-07-29 ENCOUNTER — Other Ambulatory Visit (HOSPITAL_BASED_OUTPATIENT_CLINIC_OR_DEPARTMENT_OTHER): Payer: Self-pay

## 2024-07-30 DIAGNOSIS — F1020 Alcohol dependence, uncomplicated: Secondary | ICD-10-CM | POA: Diagnosis not present

## 2024-07-30 DIAGNOSIS — F331 Major depressive disorder, recurrent, moderate: Secondary | ICD-10-CM | POA: Diagnosis not present

## 2024-07-30 DIAGNOSIS — F4310 Post-traumatic stress disorder, unspecified: Secondary | ICD-10-CM | POA: Diagnosis not present

## 2024-07-31 DIAGNOSIS — H40023 Open angle with borderline findings, high risk, bilateral: Secondary | ICD-10-CM | POA: Diagnosis not present

## 2024-08-03 DIAGNOSIS — F1020 Alcohol dependence, uncomplicated: Secondary | ICD-10-CM | POA: Diagnosis not present

## 2024-08-03 DIAGNOSIS — F331 Major depressive disorder, recurrent, moderate: Secondary | ICD-10-CM | POA: Diagnosis not present

## 2024-08-03 DIAGNOSIS — F4310 Post-traumatic stress disorder, unspecified: Secondary | ICD-10-CM | POA: Diagnosis not present

## 2024-08-04 ENCOUNTER — Other Ambulatory Visit (HOSPITAL_BASED_OUTPATIENT_CLINIC_OR_DEPARTMENT_OTHER): Payer: Self-pay

## 2024-08-05 ENCOUNTER — Other Ambulatory Visit: Payer: Self-pay

## 2024-08-05 DIAGNOSIS — F1020 Alcohol dependence, uncomplicated: Secondary | ICD-10-CM | POA: Diagnosis not present

## 2024-08-05 DIAGNOSIS — G4733 Obstructive sleep apnea (adult) (pediatric): Secondary | ICD-10-CM | POA: Diagnosis not present

## 2024-08-05 DIAGNOSIS — F331 Major depressive disorder, recurrent, moderate: Secondary | ICD-10-CM | POA: Diagnosis not present

## 2024-08-05 DIAGNOSIS — F4310 Post-traumatic stress disorder, unspecified: Secondary | ICD-10-CM | POA: Diagnosis not present

## 2024-08-06 ENCOUNTER — Other Ambulatory Visit (HOSPITAL_BASED_OUTPATIENT_CLINIC_OR_DEPARTMENT_OTHER): Payer: Self-pay

## 2024-08-07 ENCOUNTER — Other Ambulatory Visit (HOSPITAL_BASED_OUTPATIENT_CLINIC_OR_DEPARTMENT_OTHER): Payer: Self-pay

## 2024-08-07 DIAGNOSIS — L281 Prurigo nodularis: Secondary | ICD-10-CM | POA: Diagnosis not present

## 2024-08-07 DIAGNOSIS — D225 Melanocytic nevi of trunk: Secondary | ICD-10-CM | POA: Diagnosis not present

## 2024-08-07 MED ORDER — DOXYCYCLINE HYCLATE 100 MG PO TABS
100.0000 mg | ORAL_TABLET | Freq: Two times a day (BID) | ORAL | 2 refills | Status: AC
  Filled 2024-08-07: qty 60, 30d supply, fill #0
  Filled 2024-09-04: qty 60, 30d supply, fill #1
  Filled 2024-09-29: qty 60, 30d supply, fill #2

## 2024-08-07 MED ORDER — CLINDAMYCIN PHOSPHATE 1 % EX SOLN
1.0000 | Freq: Two times a day (BID) | CUTANEOUS | 11 refills | Status: AC | PRN
  Filled 2024-08-07: qty 60, 30d supply, fill #0
  Filled 2024-09-15: qty 60, 30d supply, fill #1
  Filled 2024-09-23 – 2024-10-06 (×2): qty 60, 30d supply, fill #2
  Filled 2024-11-19: qty 60, 30d supply, fill #3

## 2024-08-10 DIAGNOSIS — F4310 Post-traumatic stress disorder, unspecified: Secondary | ICD-10-CM | POA: Diagnosis not present

## 2024-08-10 DIAGNOSIS — F1020 Alcohol dependence, uncomplicated: Secondary | ICD-10-CM | POA: Diagnosis not present

## 2024-08-10 DIAGNOSIS — F331 Major depressive disorder, recurrent, moderate: Secondary | ICD-10-CM | POA: Diagnosis not present

## 2024-08-14 DIAGNOSIS — F331 Major depressive disorder, recurrent, moderate: Secondary | ICD-10-CM | POA: Diagnosis not present

## 2024-08-14 DIAGNOSIS — F1020 Alcohol dependence, uncomplicated: Secondary | ICD-10-CM | POA: Diagnosis not present

## 2024-08-14 DIAGNOSIS — F4310 Post-traumatic stress disorder, unspecified: Secondary | ICD-10-CM | POA: Diagnosis not present

## 2024-08-17 DIAGNOSIS — F4310 Post-traumatic stress disorder, unspecified: Secondary | ICD-10-CM | POA: Diagnosis not present

## 2024-08-17 DIAGNOSIS — F331 Major depressive disorder, recurrent, moderate: Secondary | ICD-10-CM | POA: Diagnosis not present

## 2024-08-17 DIAGNOSIS — F1020 Alcohol dependence, uncomplicated: Secondary | ICD-10-CM | POA: Diagnosis not present

## 2024-08-18 ENCOUNTER — Other Ambulatory Visit (HOSPITAL_BASED_OUTPATIENT_CLINIC_OR_DEPARTMENT_OTHER): Payer: Self-pay

## 2024-08-18 DIAGNOSIS — F4310 Post-traumatic stress disorder, unspecified: Secondary | ICD-10-CM | POA: Diagnosis not present

## 2024-08-18 DIAGNOSIS — F1020 Alcohol dependence, uncomplicated: Secondary | ICD-10-CM | POA: Diagnosis not present

## 2024-08-18 DIAGNOSIS — F331 Major depressive disorder, recurrent, moderate: Secondary | ICD-10-CM | POA: Diagnosis not present

## 2024-08-18 MED ORDER — BUPROPION HCL ER (XL) 300 MG PO TB24
300.0000 mg | ORAL_TABLET | Freq: Every morning | ORAL | 0 refills | Status: AC
  Filled 2024-08-18: qty 30, 30d supply, fill #0
  Filled 2024-09-23: qty 30, 30d supply, fill #1

## 2024-08-18 MED ORDER — HYDROXYZINE HCL 25 MG PO TABS
25.0000 mg | ORAL_TABLET | Freq: Two times a day (BID) | ORAL | 0 refills | Status: DC
  Filled 2024-08-18: qty 60, 30d supply, fill #0

## 2024-08-18 MED ORDER — TRINTELLIX 10 MG PO TABS
10.0000 mg | ORAL_TABLET | Freq: Every day | ORAL | 0 refills | Status: AC
  Filled 2024-08-18: qty 30, 30d supply, fill #0

## 2024-08-19 DIAGNOSIS — H6993 Unspecified Eustachian tube disorder, bilateral: Secondary | ICD-10-CM | POA: Diagnosis not present

## 2024-08-19 DIAGNOSIS — H906 Mixed conductive and sensorineural hearing loss, bilateral: Secondary | ICD-10-CM | POA: Diagnosis not present

## 2024-08-20 ENCOUNTER — Other Ambulatory Visit (HOSPITAL_BASED_OUTPATIENT_CLINIC_OR_DEPARTMENT_OTHER): Payer: Self-pay

## 2024-08-20 ENCOUNTER — Ambulatory Visit (INDEPENDENT_AMBULATORY_CARE_PROVIDER_SITE_OTHER): Admitting: Family Medicine

## 2024-08-20 ENCOUNTER — Other Ambulatory Visit: Payer: Self-pay

## 2024-08-20 ENCOUNTER — Encounter (INDEPENDENT_AMBULATORY_CARE_PROVIDER_SITE_OTHER): Payer: Self-pay | Admitting: Family Medicine

## 2024-08-20 VITALS — BP 100/64 | HR 81 | Temp 97.9°F | Ht 74.0 in | Wt 271.0 lb

## 2024-08-20 DIAGNOSIS — E119 Type 2 diabetes mellitus without complications: Secondary | ICD-10-CM

## 2024-08-20 DIAGNOSIS — Z7985 Long-term (current) use of injectable non-insulin antidiabetic drugs: Secondary | ICD-10-CM | POA: Diagnosis not present

## 2024-08-20 DIAGNOSIS — E669 Obesity, unspecified: Secondary | ICD-10-CM | POA: Diagnosis not present

## 2024-08-20 DIAGNOSIS — K5901 Slow transit constipation: Secondary | ICD-10-CM

## 2024-08-20 DIAGNOSIS — Z6841 Body Mass Index (BMI) 40.0 and over, adult: Secondary | ICD-10-CM

## 2024-08-20 DIAGNOSIS — E559 Vitamin D deficiency, unspecified: Secondary | ICD-10-CM

## 2024-08-20 DIAGNOSIS — Z6834 Body mass index (BMI) 34.0-34.9, adult: Secondary | ICD-10-CM

## 2024-08-20 DIAGNOSIS — E1169 Type 2 diabetes mellitus with other specified complication: Secondary | ICD-10-CM

## 2024-08-20 MED ORDER — TIRZEPATIDE 12.5 MG/0.5ML ~~LOC~~ SOAJ
12.5000 mg | SUBCUTANEOUS | 0 refills | Status: DC
  Filled 2024-08-20: qty 2, 28d supply, fill #0
  Filled 2024-09-23: qty 2, 28d supply, fill #1
  Filled 2024-10-20: qty 2, 28d supply, fill #2

## 2024-08-20 MED ORDER — VITAMIN D (ERGOCALCIFEROL) 1.25 MG (50000 UNIT) PO CAPS
50000.0000 [IU] | ORAL_CAPSULE | ORAL | 0 refills | Status: DC
  Filled 2024-08-20: qty 4, 28d supply, fill #0
  Filled 2024-09-23: qty 4, 28d supply, fill #1
  Filled 2024-10-20: qty 4, 28d supply, fill #2

## 2024-08-20 NOTE — Progress Notes (Signed)
 Office: [phone]  /  Fax: [phone]  WEIGHT SUMMARY AND BIOMETRICS  Anthropometric Measurements Height: 6' 2 (1.88 m) Weight: 271 lb (122.9 kg) BMI (Calculated): 34.78 Weight at Last Visit: 286 lb Weight Lost Since Last Visit: 15 lb Weight Gained Since Last Visit: 0 Starting Weight: 342 lb Total Weight Loss (lbs): 71 lb (32.2 kg) Peak Weight: 342 lb   Body Composition  Body Fat %: 34.6 % Fat Mass (lbs): 93.8 lbs Muscle Mass (lbs): 168.6 lbs Total Body Water (lbs): 122 lbs Visceral Fat Rating : 20   Other Clinical Data Fasting: yes Labs: no Today's Visit #: 74 Starting Date: 03/24/20    Chief Complaint: OBESITY    History of Present Illness Luis Russell is a 58 year old male who presents for obesity treatment management.  He has been following the category three eating plan about 50% of the time and has lost 15 pounds over the past two months. His primary form of exercise is yard work, which he performs three to four days a week for 90 minutes each session. He is concerned about his protein intake.  He experiences fatigue and soreness, which he attributes to recent physical activities, including extensive yard work and moving heavy objects. 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  His medication regimen includes a nasal spray, iron supplements, and Wellbutrin  for anxiety. He also uses a CPAP machine. He experiences occasional constipation, which he manages by staying hydrated and active.  He is concerned about his wife's health, noting her lack of exercise and weight issues, which he fears may lead to serious health problems similar to those experienced by her mother, who passed away at 18 due to weight-related complications. His wife has undergone multiple bowel surgeries and is on several medications.  He discusses his dietary habits, noting smaller portion  sizes and sometimes taking two days to finish a meal. His typical meals include Cheerios with milk for breakfast, leftovers or a good meal for lunch, and a normal dinner, with occasional candy and diet [facility]. He is unsure if he meets his daily protein requirements, which may be affecting his muscle mass.  He wants to move to a more active environment, such as the beach, where he feels more motivated to engage in physical activities. Currently, he stays active through housework and various projects, as he finds traditional exercise routines less appealing.      PHYSICAL EXAM:  Blood pressure 100/64, pulse 81, temperature 97.9 F (36.6 C), height 6' 2 (1.88 m), weight 271 lb (122.9 kg), SpO2 94%. Body mass index is 34.79 kg/m.  DIAGNOSTIC DATA REVIEWED:  BMET    Component Value Date/Time   NA 138 06/23/2024 0749   K 4.2 06/23/2024 0749   CL 101 06/23/2024 0749   CO2 23 06/23/2024 0749   GLUCOSE 94 06/23/2024 0749   GLUCOSE 146 (H) 07/09/2016 1823   BUN 11 06/23/2024 0749   CREATININE 1.06 06/23/2024 0749   CALCIUM  8.8 06/23/2024 0749   GFRNONAA 75 12/08/2020 1402   GFRAA 87 12/08/2020 1402   Lab Results  Component Value Date   HGBA1C 5.2 06/23/2024   HGBA1C 5.8 (H) 06/08/2016   Lab Results  Component Value Date   INSULIN  16.5 06/23/2024   INSULIN  33.8 (H) 03/24/2020   Lab Results  Component Value Date   TSH 3.710 06/23/2024   CBC    Component Value Date/Time  WBC 5.4 06/23/2024 0749   WBC 8.9 07/09/2016 1823   RBC 4.88 06/23/2024 0749   RBC 4.51 07/09/2016 1823   HGB 14.8 06/23/2024 0749   HCT 44.8 06/23/2024 0749   PLT 186 06/23/2024 0749   MCV 92 06/23/2024 0749   MCH 30.3 06/23/2024 0749   MCH 29.3 07/09/2016 1823   MCHC 33.0 06/23/2024 0749   MCHC 32.1 07/09/2016 1823   RDW 12.7 06/23/2024 0749   Iron Studies No results found for: IRON, TIBC, FERRITIN, IRONPCTSAT Lipid Panel     Component Value Date/Time   CHOL 226 (H) 06/23/2024  0749   TRIG 67 06/23/2024 0749   HDL 62 06/23/2024 0749   CHOLHDL 4.5 12/08/2020 1402   LDLCALC 152 (H) 06/23/2024 0749   Hepatic Function Panel     Component Value Date/Time   PROT 6.6 06/23/2024 0749   ALBUMIN 4.2 06/23/2024 0749   AST 23 06/23/2024 0749   ALT 24 06/23/2024 0749   ALKPHOS 66 06/23/2024 0749   BILITOT 1.2 06/23/2024 0749      Component Value Date/Time   TSH 3.710 06/23/2024 0749   Nutritional Lab Results  Component Value Date   VD25OH 43.9 06/23/2024   VD25OH 24.1 (L) 02/05/2024   VD25OH 33.1 04/30/2023     Assessment and Plan Assessment & Plan Obesity Obesity with recent weight loss of 15 pounds over the last two months. Following the category three eating plan 50% of the time and engages in significant physical activity. Muscle mass has decreased, likely due to insufficient protein intake. - Refill Mounjaro  prescription for 90 days. - Advise to track protein intake for one week to ensure at least 120 grams per day. - Encourage continued physical activity and adherence to the eating plan.  Type 2 diabetes mellitus Type 2 diabetes mellitus managed with Mounjaro . No issues with nausea, but mild constipation noted, improving with increased water intake and physical activity. - Refill Mounjaro  prescription for 90 days. - Advise to maintain hydration and physical activity to manage constipation.      Luis Russell was informed of the importance of frequent follow up visits to maximize his success with intensive lifestyle modifications for his obesity and obesity related health conditions as recommended by USPSTF and CMS guidelines   Luis Penton, MD

## 2024-08-20 NOTE — Addendum Note (Signed)
 Addended by: LAFE BAKER CROME on: 08/20/2024 02:43 PM   Modules accepted: Orders

## 2024-08-24 ENCOUNTER — Other Ambulatory Visit (HOSPITAL_BASED_OUTPATIENT_CLINIC_OR_DEPARTMENT_OTHER): Payer: Self-pay

## 2024-08-24 DIAGNOSIS — F331 Major depressive disorder, recurrent, moderate: Secondary | ICD-10-CM | POA: Diagnosis not present

## 2024-08-24 DIAGNOSIS — F4310 Post-traumatic stress disorder, unspecified: Secondary | ICD-10-CM | POA: Diagnosis not present

## 2024-08-24 DIAGNOSIS — F1020 Alcohol dependence, uncomplicated: Secondary | ICD-10-CM | POA: Diagnosis not present

## 2024-08-25 DIAGNOSIS — F4310 Post-traumatic stress disorder, unspecified: Secondary | ICD-10-CM | POA: Diagnosis not present

## 2024-08-25 DIAGNOSIS — G4700 Insomnia, unspecified: Secondary | ICD-10-CM | POA: Diagnosis not present

## 2024-08-25 DIAGNOSIS — F411 Generalized anxiety disorder: Secondary | ICD-10-CM | POA: Diagnosis not present

## 2024-08-26 ENCOUNTER — Other Ambulatory Visit: Payer: Self-pay

## 2024-08-27 DIAGNOSIS — F1020 Alcohol dependence, uncomplicated: Secondary | ICD-10-CM | POA: Diagnosis not present

## 2024-08-27 DIAGNOSIS — F331 Major depressive disorder, recurrent, moderate: Secondary | ICD-10-CM | POA: Diagnosis not present

## 2024-08-27 DIAGNOSIS — F4310 Post-traumatic stress disorder, unspecified: Secondary | ICD-10-CM | POA: Diagnosis not present

## 2024-08-29 ENCOUNTER — Other Ambulatory Visit (HOSPITAL_BASED_OUTPATIENT_CLINIC_OR_DEPARTMENT_OTHER): Payer: Self-pay

## 2024-08-31 DIAGNOSIS — F331 Major depressive disorder, recurrent, moderate: Secondary | ICD-10-CM | POA: Diagnosis not present

## 2024-08-31 DIAGNOSIS — F1020 Alcohol dependence, uncomplicated: Secondary | ICD-10-CM | POA: Diagnosis not present

## 2024-08-31 DIAGNOSIS — F4310 Post-traumatic stress disorder, unspecified: Secondary | ICD-10-CM | POA: Diagnosis not present

## 2024-09-01 ENCOUNTER — Encounter (INDEPENDENT_AMBULATORY_CARE_PROVIDER_SITE_OTHER): Payer: Self-pay

## 2024-09-01 DIAGNOSIS — R400 Somnolence: Secondary | ICD-10-CM | POA: Insufficient documentation

## 2024-09-01 DIAGNOSIS — F4310 Post-traumatic stress disorder, unspecified: Secondary | ICD-10-CM | POA: Insufficient documentation

## 2024-09-01 DIAGNOSIS — F419 Anxiety disorder, unspecified: Secondary | ICD-10-CM | POA: Insufficient documentation

## 2024-09-01 DIAGNOSIS — J301 Allergic rhinitis due to pollen: Secondary | ICD-10-CM | POA: Insufficient documentation

## 2024-09-01 DIAGNOSIS — G4719 Other hypersomnia: Secondary | ICD-10-CM | POA: Insufficient documentation

## 2024-09-01 DIAGNOSIS — G479 Sleep disorder, unspecified: Secondary | ICD-10-CM | POA: Insufficient documentation

## 2024-09-01 DIAGNOSIS — F900 Attention-deficit hyperactivity disorder, predominantly inattentive type: Secondary | ICD-10-CM | POA: Insufficient documentation

## 2024-09-01 DIAGNOSIS — G4733 Obstructive sleep apnea (adult) (pediatric): Secondary | ICD-10-CM | POA: Insufficient documentation

## 2024-09-01 DIAGNOSIS — N400 Enlarged prostate without lower urinary tract symptoms: Secondary | ICD-10-CM | POA: Insufficient documentation

## 2024-09-01 DIAGNOSIS — G4700 Insomnia, unspecified: Secondary | ICD-10-CM | POA: Insufficient documentation

## 2024-09-01 DIAGNOSIS — E7800 Pure hypercholesterolemia, unspecified: Secondary | ICD-10-CM | POA: Insufficient documentation

## 2024-09-01 DIAGNOSIS — N401 Enlarged prostate with lower urinary tract symptoms: Secondary | ICD-10-CM | POA: Insufficient documentation

## 2024-09-01 DIAGNOSIS — F331 Major depressive disorder, recurrent, moderate: Secondary | ICD-10-CM | POA: Insufficient documentation

## 2024-09-01 DIAGNOSIS — F5101 Primary insomnia: Secondary | ICD-10-CM | POA: Insufficient documentation

## 2024-09-01 DIAGNOSIS — M2550 Pain in unspecified joint: Secondary | ICD-10-CM | POA: Insufficient documentation

## 2024-09-01 DIAGNOSIS — E669 Obesity, unspecified: Secondary | ICD-10-CM | POA: Insufficient documentation

## 2024-09-01 DIAGNOSIS — K5792 Diverticulitis of intestine, part unspecified, without perforation or abscess without bleeding: Secondary | ICD-10-CM | POA: Insufficient documentation

## 2024-09-01 DIAGNOSIS — E291 Testicular hypofunction: Secondary | ICD-10-CM | POA: Insufficient documentation

## 2024-09-01 DIAGNOSIS — Z8616 Personal history of COVID-19: Secondary | ICD-10-CM | POA: Insufficient documentation

## 2024-09-01 DIAGNOSIS — R413 Other amnesia: Secondary | ICD-10-CM | POA: Insufficient documentation

## 2024-09-01 DIAGNOSIS — F329 Major depressive disorder, single episode, unspecified: Secondary | ICD-10-CM | POA: Insufficient documentation

## 2024-09-01 DIAGNOSIS — F5104 Psychophysiologic insomnia: Secondary | ICD-10-CM | POA: Insufficient documentation

## 2024-09-01 DIAGNOSIS — K4090 Unilateral inguinal hernia, without obstruction or gangrene, not specified as recurrent: Secondary | ICD-10-CM | POA: Insufficient documentation

## 2024-09-01 DIAGNOSIS — F3340 Major depressive disorder, recurrent, in remission, unspecified: Secondary | ICD-10-CM | POA: Insufficient documentation

## 2024-09-01 DIAGNOSIS — J329 Chronic sinusitis, unspecified: Secondary | ICD-10-CM | POA: Insufficient documentation

## 2024-09-03 ENCOUNTER — Other Ambulatory Visit (HOSPITAL_BASED_OUTPATIENT_CLINIC_OR_DEPARTMENT_OTHER): Payer: Self-pay

## 2024-09-03 DIAGNOSIS — H6993 Unspecified Eustachian tube disorder, bilateral: Secondary | ICD-10-CM | POA: Diagnosis not present

## 2024-09-03 DIAGNOSIS — H906 Mixed conductive and sensorineural hearing loss, bilateral: Secondary | ICD-10-CM | POA: Diagnosis not present

## 2024-09-03 MED ORDER — OFLOXACIN 0.3 % OT SOLN
5.0000 [drp] | Freq: Two times a day (BID) | OTIC | 1 refills | Status: AC
  Filled 2024-09-03: qty 5, 10d supply, fill #0

## 2024-09-04 DIAGNOSIS — F4310 Post-traumatic stress disorder, unspecified: Secondary | ICD-10-CM | POA: Diagnosis not present

## 2024-09-04 DIAGNOSIS — F331 Major depressive disorder, recurrent, moderate: Secondary | ICD-10-CM | POA: Diagnosis not present

## 2024-09-04 DIAGNOSIS — F1020 Alcohol dependence, uncomplicated: Secondary | ICD-10-CM | POA: Diagnosis not present

## 2024-09-07 DIAGNOSIS — F1020 Alcohol dependence, uncomplicated: Secondary | ICD-10-CM | POA: Diagnosis not present

## 2024-09-07 DIAGNOSIS — F331 Major depressive disorder, recurrent, moderate: Secondary | ICD-10-CM | POA: Diagnosis not present

## 2024-09-07 DIAGNOSIS — F4310 Post-traumatic stress disorder, unspecified: Secondary | ICD-10-CM | POA: Diagnosis not present

## 2024-09-14 DIAGNOSIS — F1020 Alcohol dependence, uncomplicated: Secondary | ICD-10-CM | POA: Diagnosis not present

## 2024-09-14 DIAGNOSIS — F4310 Post-traumatic stress disorder, unspecified: Secondary | ICD-10-CM | POA: Diagnosis not present

## 2024-09-14 DIAGNOSIS — F331 Major depressive disorder, recurrent, moderate: Secondary | ICD-10-CM | POA: Diagnosis not present

## 2024-09-15 ENCOUNTER — Other Ambulatory Visit (HOSPITAL_BASED_OUTPATIENT_CLINIC_OR_DEPARTMENT_OTHER): Payer: Self-pay

## 2024-09-21 DIAGNOSIS — F4310 Post-traumatic stress disorder, unspecified: Secondary | ICD-10-CM | POA: Diagnosis not present

## 2024-09-21 DIAGNOSIS — F1020 Alcohol dependence, uncomplicated: Secondary | ICD-10-CM | POA: Diagnosis not present

## 2024-09-21 DIAGNOSIS — F331 Major depressive disorder, recurrent, moderate: Secondary | ICD-10-CM | POA: Diagnosis not present

## 2024-09-23 ENCOUNTER — Other Ambulatory Visit (HOSPITAL_BASED_OUTPATIENT_CLINIC_OR_DEPARTMENT_OTHER): Payer: Self-pay

## 2024-09-23 ENCOUNTER — Other Ambulatory Visit: Payer: Self-pay

## 2024-09-25 ENCOUNTER — Other Ambulatory Visit: Payer: Self-pay

## 2024-09-28 ENCOUNTER — Other Ambulatory Visit (HOSPITAL_BASED_OUTPATIENT_CLINIC_OR_DEPARTMENT_OTHER): Payer: Self-pay

## 2024-09-28 DIAGNOSIS — F1020 Alcohol dependence, uncomplicated: Secondary | ICD-10-CM | POA: Diagnosis not present

## 2024-09-28 DIAGNOSIS — F331 Major depressive disorder, recurrent, moderate: Secondary | ICD-10-CM | POA: Diagnosis not present

## 2024-09-28 DIAGNOSIS — F4310 Post-traumatic stress disorder, unspecified: Secondary | ICD-10-CM | POA: Diagnosis not present

## 2024-09-29 ENCOUNTER — Other Ambulatory Visit (HOSPITAL_BASED_OUTPATIENT_CLINIC_OR_DEPARTMENT_OTHER): Payer: Self-pay

## 2024-09-29 ENCOUNTER — Other Ambulatory Visit: Payer: Self-pay

## 2024-09-29 DIAGNOSIS — R7989 Other specified abnormal findings of blood chemistry: Secondary | ICD-10-CM | POA: Diagnosis not present

## 2024-09-29 MED ORDER — TESTOSTERONE CYPIONATE 200 MG/ML IM SOLN
100.0000 mg | INTRAMUSCULAR | 5 refills | Status: AC
  Filled 2024-10-20: qty 4, 28d supply, fill #0
  Filled 2024-11-19: qty 3, 21d supply, fill #0
  Filled 2024-11-20: qty 4, 30d supply, fill #0
  Filled 2024-11-23: qty 10, 140d supply, fill #1

## 2024-09-29 MED ORDER — HYDROXYZINE HCL 10 MG PO TABS
10.0000 mg | ORAL_TABLET | Freq: Two times a day (BID) | ORAL | 0 refills | Status: AC | PRN
  Filled 2024-09-29: qty 60, 30d supply, fill #0

## 2024-09-30 ENCOUNTER — Other Ambulatory Visit (HOSPITAL_BASED_OUTPATIENT_CLINIC_OR_DEPARTMENT_OTHER): Payer: Self-pay

## 2024-10-06 DIAGNOSIS — S00411A Abrasion of right ear, initial encounter: Secondary | ICD-10-CM | POA: Diagnosis not present

## 2024-10-06 DIAGNOSIS — H6121 Impacted cerumen, right ear: Secondary | ICD-10-CM | POA: Diagnosis not present

## 2024-10-06 DIAGNOSIS — Z9622 Myringotomy tube(s) status: Secondary | ICD-10-CM | POA: Diagnosis not present

## 2024-10-06 DIAGNOSIS — T161XXA Foreign body in right ear, initial encounter: Secondary | ICD-10-CM | POA: Diagnosis not present

## 2024-10-20 ENCOUNTER — Other Ambulatory Visit (HOSPITAL_BASED_OUTPATIENT_CLINIC_OR_DEPARTMENT_OTHER): Payer: Self-pay

## 2024-10-20 ENCOUNTER — Other Ambulatory Visit: Payer: Self-pay

## 2024-10-27 ENCOUNTER — Ambulatory Visit (INDEPENDENT_AMBULATORY_CARE_PROVIDER_SITE_OTHER): Payer: Self-pay | Admitting: Family Medicine

## 2024-10-27 ENCOUNTER — Encounter (INDEPENDENT_AMBULATORY_CARE_PROVIDER_SITE_OTHER): Payer: Self-pay | Admitting: Family Medicine

## 2024-10-27 ENCOUNTER — Other Ambulatory Visit (HOSPITAL_BASED_OUTPATIENT_CLINIC_OR_DEPARTMENT_OTHER): Payer: Self-pay

## 2024-10-27 VITALS — BP 107/71 | HR 79 | Temp 98.7°F | Ht 74.0 in | Wt 266.0 lb

## 2024-10-27 DIAGNOSIS — Z6834 Body mass index (BMI) 34.0-34.9, adult: Secondary | ICD-10-CM

## 2024-10-27 DIAGNOSIS — E669 Obesity, unspecified: Secondary | ICD-10-CM

## 2024-10-27 DIAGNOSIS — E1169 Type 2 diabetes mellitus with other specified complication: Secondary | ICD-10-CM

## 2024-10-27 DIAGNOSIS — Z6841 Body Mass Index (BMI) 40.0 and over, adult: Secondary | ICD-10-CM

## 2024-10-27 DIAGNOSIS — K760 Fatty (change of) liver, not elsewhere classified: Secondary | ICD-10-CM | POA: Diagnosis not present

## 2024-10-27 DIAGNOSIS — Z7984 Long term (current) use of oral hypoglycemic drugs: Secondary | ICD-10-CM

## 2024-10-27 MED ORDER — TIRZEPATIDE 12.5 MG/0.5ML ~~LOC~~ SOAJ
12.5000 mg | SUBCUTANEOUS | 0 refills | Status: AC
  Filled 2024-10-27: qty 6, 84d supply, fill #0
  Filled 2024-11-23: qty 2, 28d supply, fill #0

## 2024-10-27 MED ORDER — COMIRNATY 30 MCG/0.3ML IM SUSY
0.3000 mL | PREFILLED_SYRINGE | Freq: Once | INTRAMUSCULAR | 0 refills | Status: AC
  Filled 2024-10-27: qty 0.3, 1d supply, fill #0

## 2024-10-27 MED ORDER — FLUZONE 0.5 ML IM SUSY
0.5000 mL | PREFILLED_SYRINGE | Freq: Once | INTRAMUSCULAR | 0 refills | Status: AC
  Filled 2024-10-27: qty 0.5, 1d supply, fill #0

## 2024-10-27 NOTE — Progress Notes (Signed)
 "  Office: [phone]  /  Fax: [phone]  WEIGHT SUMMARY AND BIOMETRICS  Anthropometric Measurements Height: 6' 2 (1.88 m) Weight: 266 lb (120.7 kg) BMI (Calculated): 34.14 Weight at Last Visit: 271lb Weight Lost Since Last Visit: 5lb Weight Gained Since Last Visit: 0lb Starting Weight: 342lb Total Weight Loss (lbs): 76 lb (34.5 kg) Peak Weight: 342lb   Body Composition  Body Fat %: 34.2 % Fat Mass (lbs): 91.2 lbs Muscle Mass (lbs): 166.8 lbs Total Body Water (lbs): 118.6 lbs Visceral Fat Rating : 19   Other Clinical Data RMR: [id number] Fasting: No Labs: No Today's Visit #: 44 Starting Date: 03/24/20    Chief Complaint: OBESITY   Discussed the use of AI scribe software for clinical note transcription with the patient, who gave verbal consent to proceed.  History of Present Illness Luis Russell is a 58 year old male with obesity and type 2 diabetes who presents for obesity treatment and progress assessment.  He has been adhering to a category four eating plan about fifty percent of the time, focusing on protein intake, increasing fruits and vegetables, maintaining hydration, and not skipping meals. He generally sleeps seven to nine hours per night but is not currently engaging in exercise. Despite this, he has lost five pounds over the last six weeks, including during the Thanksgiving and Christmas holidays.  He is being treated for type 2 diabetes with Mounjaro  12.5 mg. His typical daily meals include coffee and something small for breakfast, a mild lunch, and an 'okay' dinner, which he often does not finish, resulting in leftovers.  He attributes some of his progress to testosterone  treatment, which he receives from a urologist. He takes testosterone  shots every seven days and feels that it helps him stay active. He is concerned about his wife's energy levels and her family history of health issues, including pancreatic cancer and obesity-related  problems.  He has taken time off work due to stress and other personal matters. He discusses changes in his insurance plan, which have increased his copays and affected his healthcare decisions. He mentions feeling tired due to the holidays but does not report any other specific symptoms.      PHYSICAL EXAM:  Blood pressure 107/71, pulse 79, temperature 98.7 F (37.1 C), height 6' 2 (1.88 m), weight 266 lb (120.7 kg), SpO2 100%. Body mass index is 34.15 kg/m.  DIAGNOSTIC DATA REVIEWED:  BMET    Component Value Date/Time   NA 138 06/23/2024 0749   K 4.2 06/23/2024 0749   CL 101 06/23/2024 0749   CO2 23 06/23/2024 0749   GLUCOSE 94 06/23/2024 0749   GLUCOSE 146 (H) 07/09/2016 1823   BUN 11 06/23/2024 0749   CREATININE 1.06 06/23/2024 0749   CALCIUM  8.8 06/23/2024 0749   GFRNONAA 75 12/08/2020 1402   GFRAA 87 12/08/2020 1402   Lab Results  Component Value Date   HGBA1C 5.2 06/23/2024   HGBA1C 5.8 (H) 06/08/2016   Lab Results  Component Value Date   INSULIN  16.5 06/23/2024   INSULIN  33.8 (H) 03/24/2020   Lab Results  Component Value Date   TSH 3.710 06/23/2024   CBC    Component Value Date/Time   WBC 5.4 06/23/2024 0749   WBC 8.9 07/09/2016 1823   RBC 4.88 06/23/2024 0749   RBC 4.51 07/09/2016 1823   HGB 14.8 06/23/2024 0749   HCT 44.8 06/23/2024 0749   PLT 186 06/23/2024 0749   MCV 92 06/23/2024 0749   MCH 30.3 06/23/2024  0749   MCH 29.3 07/09/2016 1823   MCHC 33.0 06/23/2024 0749   MCHC 32.1 07/09/2016 1823   RDW 12.7 06/23/2024 0749   Iron Studies No results found for: IRON, TIBC, FERRITIN, IRONPCTSAT Lipid Panel     Component Value Date/Time   CHOL 226 (H) 06/23/2024 0749   TRIG 67 06/23/2024 0749   HDL 62 06/23/2024 0749   CHOLHDL 4.5 12/08/2020 1402   LDLCALC 152 (H) 06/23/2024 0749   Hepatic Function Panel     Component Value Date/Time   PROT 6.6 06/23/2024 0749   ALBUMIN 4.2 06/23/2024 0749   AST 23 06/23/2024 0749   ALT 24  06/23/2024 0749   ALKPHOS 66 06/23/2024 0749   BILITOT 1.2 06/23/2024 0749      Component Value Date/Time   TSH 3.710 06/23/2024 0749   Nutritional Lab Results  Component Value Date   VD25OH 43.9 06/23/2024   VD25OH 24.1 (L) 02/05/2024   VD25OH 33.1 04/30/2023     Assessment and Plan Assessment & Plan Obesity Management is ongoing with a focus on dietary changes and weight loss. He has lost 5 pounds over the last six weeks, including weight loss over Thanksgiving and Christmas, which is commendable. He is following the category four eating plan about 50% of the time, working on meeting protein goals, increasing fruits and vegetables, and improving hydration. He is not currently exercising but is generally getting 7-9 hours of sleep per night. - Continue category four eating plan - Encouraged meeting protein goals - Encouraged increased intake of fruits and vegetables - Encouraged hydration - Encouraged regular exercise  Type 2 diabetes mellitus with other specified complication Type 2 diabetes is being managed with Mounjaro  12.5 mg. He is also focusing on diet, exercise, and weight loss. No issues with blood sugar levels reported. Mounjaro  helps regulate blood sugar levels, preventing hypoglycemia if levels are high, but can cause hypoglycemia if meals are skipped. - Continue Mounjaro  12.5 mg - Ensure regular meals to prevent hypoglycemia  Fatty liver disease Present, but weight loss is the primary treatment. He reports feeling better and believes weight loss has positively impacted his liver health. - Continue weight loss efforts and will continue to monitor progress.      Patients who are on anti-obesity medications are counseled on the importance of maintaining healthy lifestyle habits, including balanced nutrition, regular physical activity, and behavioral modifications,  Medication is an adjunct to, not a replacement for, lifestyle changes and that the long-term success  and weight maintenance depend on continued adherence to these strategies.   Artavis was informed of the importance of frequent follow up visits to maximize his success with intensive lifestyle modifications for his obesity and obesity related health conditions as recommended by USPSTF and CMS guidelines  Louann Penton, MD   "

## 2024-10-28 ENCOUNTER — Other Ambulatory Visit (HOSPITAL_BASED_OUTPATIENT_CLINIC_OR_DEPARTMENT_OTHER): Payer: Self-pay

## 2024-10-28 ENCOUNTER — Other Ambulatory Visit: Payer: Self-pay

## 2024-10-29 ENCOUNTER — Encounter (INDEPENDENT_AMBULATORY_CARE_PROVIDER_SITE_OTHER): Payer: Self-pay | Admitting: Family Medicine

## 2024-10-31 ENCOUNTER — Other Ambulatory Visit (HOSPITAL_BASED_OUTPATIENT_CLINIC_OR_DEPARTMENT_OTHER): Payer: Self-pay

## 2024-11-03 ENCOUNTER — Other Ambulatory Visit (HOSPITAL_BASED_OUTPATIENT_CLINIC_OR_DEPARTMENT_OTHER): Payer: Self-pay

## 2024-11-03 MED ORDER — "EASY TOUCH HYPODERMIC NEEDLE 18G X 1"" MISC"
3 refills | Status: AC
  Filled 2024-11-03 (×2): qty 15, 15d supply, fill #0

## 2024-11-03 MED ORDER — "SYRINGE 25G X 1"" 3 ML MISC"
3 refills | Status: AC
  Filled 2024-11-03: qty 15, 15d supply, fill #0

## 2024-11-19 ENCOUNTER — Other Ambulatory Visit (INDEPENDENT_AMBULATORY_CARE_PROVIDER_SITE_OTHER): Payer: Self-pay | Admitting: Family Medicine

## 2024-11-19 ENCOUNTER — Other Ambulatory Visit: Payer: Self-pay

## 2024-11-19 ENCOUNTER — Other Ambulatory Visit (HOSPITAL_BASED_OUTPATIENT_CLINIC_OR_DEPARTMENT_OTHER): Payer: Self-pay

## 2024-11-19 DIAGNOSIS — E559 Vitamin D deficiency, unspecified: Secondary | ICD-10-CM

## 2024-11-19 NOTE — Telephone Encounter (Signed)
 LAST APPOINTMENT DATE: 10/27/2024 NEXT APPOINTMENT DATE: 01/25/2025   [facility] [address] [place] [address] Phone: [phone] Fax: [phone]  Patient is requesting a refill of the following medications: Requested Prescriptions   Pending Prescriptions Disp Refills   Vitamin D , Ergocalciferol , (DRISDOL ) 1.25 MG (50000 UNIT) CAPS capsule 12 capsule 0    Sig: Take 1 capsule (50,000 Units total) by mouth every 7 (seven) days.    Date last filled: 08/20/2024 Previously prescribed by Dr Verdon  Lab Results  Component Value Date   HGBA1C 5.2 06/23/2024   HGBA1C 5.5 02/05/2024   HGBA1C 5.6 04/30/2023   Lab Results  Component Value Date   LDLCALC 152 (H) 06/23/2024   CREATININE 1.06 06/23/2024   Lab Results  Component Value Date   VD25OH 43.9 06/23/2024   VD25OH 24.1 (L) 02/05/2024   VD25OH 33.1 04/30/2023    BP Readings from Last 3 Encounters:  10/27/24 107/71  08/20/24 100/64  06/23/24 98/65

## 2024-11-20 ENCOUNTER — Other Ambulatory Visit (HOSPITAL_BASED_OUTPATIENT_CLINIC_OR_DEPARTMENT_OTHER): Payer: Self-pay

## 2024-11-23 ENCOUNTER — Other Ambulatory Visit: Payer: Self-pay

## 2024-11-24 ENCOUNTER — Other Ambulatory Visit (HOSPITAL_BASED_OUTPATIENT_CLINIC_OR_DEPARTMENT_OTHER): Payer: Self-pay

## 2024-11-24 MED ORDER — VITAMIN D (ERGOCALCIFEROL) 1.25 MG (50000 UNIT) PO CAPS
50000.0000 [IU] | ORAL_CAPSULE | ORAL | 0 refills | Status: AC
  Filled 2024-11-24: qty 4, 28d supply, fill #0

## 2025-01-25 ENCOUNTER — Ambulatory Visit (INDEPENDENT_AMBULATORY_CARE_PROVIDER_SITE_OTHER): Admitting: Family Medicine
